# Patient Record
Sex: Female | Born: 1945
Health system: Southern US, Community
[De-identification: ages and names within clinical notes are randomized; demographics above are authoritative.]

## PROBLEM LIST (undated history)

## (undated) DIAGNOSIS — M1990 Unspecified osteoarthritis, unspecified site: Secondary | ICD-10-CM

## (undated) DIAGNOSIS — Z8719 Personal history of other diseases of the digestive system: Secondary | ICD-10-CM

## (undated) DIAGNOSIS — T7840XA Allergy, unspecified, initial encounter: Secondary | ICD-10-CM

## (undated) DIAGNOSIS — F329 Major depressive disorder, single episode, unspecified: Secondary | ICD-10-CM

## (undated) DIAGNOSIS — Z87442 Personal history of urinary calculi: Secondary | ICD-10-CM

## (undated) DIAGNOSIS — B019 Varicella without complication: Secondary | ICD-10-CM

## (undated) DIAGNOSIS — N390 Urinary tract infection, site not specified: Secondary | ICD-10-CM

## (undated) DIAGNOSIS — K635 Polyp of colon: Secondary | ICD-10-CM

## (undated) DIAGNOSIS — I809 Phlebitis and thrombophlebitis of unspecified site: Secondary | ICD-10-CM

## (undated) DIAGNOSIS — E119 Type 2 diabetes mellitus without complications: Secondary | ICD-10-CM

## (undated) DIAGNOSIS — F988 Other specified behavioral and emotional disorders with onset usually occurring in childhood and adolescence: Secondary | ICD-10-CM

## (undated) DIAGNOSIS — N200 Calculus of kidney: Secondary | ICD-10-CM

## (undated) DIAGNOSIS — F32A Depression, unspecified: Secondary | ICD-10-CM

## (undated) DIAGNOSIS — D649 Anemia, unspecified: Secondary | ICD-10-CM

## (undated) DIAGNOSIS — K219 Gastro-esophageal reflux disease without esophagitis: Secondary | ICD-10-CM

## (undated) HISTORY — DX: Phlebitis and thrombophlebitis of unspecified site: I80.9

## (undated) HISTORY — PX: WISDOM TOOTH EXTRACTION: SHX21

## (undated) HISTORY — PX: URETEROCELE INCISION: SHX2612

## (undated) HISTORY — DX: Major depressive disorder, single episode, unspecified: F32.9

## (undated) HISTORY — PX: TUBAL LIGATION: SHX77

## (undated) HISTORY — PX: VASCULAR SURGERY: SHX849

## (undated) HISTORY — DX: Calculus of kidney: N20.0

## (undated) HISTORY — DX: Depression, unspecified: F32.A

## (undated) HISTORY — DX: Varicella without complication: B01.9

## (undated) HISTORY — DX: Allergy, unspecified, initial encounter: T78.40XA

## (undated) HISTORY — DX: Other specified behavioral and emotional disorders with onset usually occurring in childhood and adolescence: F98.8

## (undated) HISTORY — DX: Unspecified osteoarthritis, unspecified site: M19.90

## (undated) HISTORY — DX: Urinary tract infection, site not specified: N39.0

## (undated) HISTORY — DX: Type 2 diabetes mellitus without complications: E11.9

## (undated) HISTORY — DX: Polyp of colon: K63.5

## (undated) HISTORY — DX: Anemia, unspecified: D64.9

## (undated) HISTORY — DX: Gastro-esophageal reflux disease without esophagitis: K21.9

---

## 1997-12-19 ENCOUNTER — Ambulatory Visit (HOSPITAL_COMMUNITY): Admission: RE | Admit: 1997-12-19 | Discharge: 1997-12-19 | Payer: Self-pay | Admitting: Family Medicine

## 1997-12-19 ENCOUNTER — Encounter: Payer: Self-pay | Admitting: Family Medicine

## 1998-01-03 ENCOUNTER — Encounter: Payer: Self-pay | Admitting: Family Medicine

## 1998-01-03 ENCOUNTER — Ambulatory Visit (HOSPITAL_COMMUNITY): Admission: RE | Admit: 1998-01-03 | Discharge: 1998-01-03 | Payer: Self-pay | Admitting: Family Medicine

## 1998-09-15 ENCOUNTER — Other Ambulatory Visit: Admission: RE | Admit: 1998-09-15 | Discharge: 1998-09-15 | Payer: Self-pay | Admitting: Family Medicine

## 1999-01-09 ENCOUNTER — Encounter: Payer: Self-pay | Admitting: Family Medicine

## 1999-01-09 ENCOUNTER — Ambulatory Visit (HOSPITAL_COMMUNITY): Admission: RE | Admit: 1999-01-09 | Discharge: 1999-01-09 | Payer: Self-pay | Admitting: Family Medicine

## 1999-01-19 ENCOUNTER — Encounter: Payer: Self-pay | Admitting: Family Medicine

## 1999-01-19 ENCOUNTER — Ambulatory Visit (HOSPITAL_COMMUNITY): Admission: RE | Admit: 1999-01-19 | Discharge: 1999-01-19 | Payer: Self-pay | Admitting: Family Medicine

## 1999-10-09 ENCOUNTER — Other Ambulatory Visit: Admission: RE | Admit: 1999-10-09 | Discharge: 1999-10-09 | Payer: Self-pay | Admitting: *Deleted

## 2001-05-04 ENCOUNTER — Ambulatory Visit (HOSPITAL_COMMUNITY): Admission: RE | Admit: 2001-05-04 | Discharge: 2001-05-04 | Payer: Self-pay | Admitting: Family Medicine

## 2001-05-04 ENCOUNTER — Encounter: Payer: Self-pay | Admitting: Family Medicine

## 2006-04-06 ENCOUNTER — Ambulatory Visit (HOSPITAL_COMMUNITY): Admission: RE | Admit: 2006-04-06 | Discharge: 2006-04-06 | Payer: Self-pay | Admitting: Obstetrics and Gynecology

## 2006-04-19 ENCOUNTER — Other Ambulatory Visit: Admission: RE | Admit: 2006-04-19 | Discharge: 2006-04-19 | Payer: Self-pay | Admitting: Obstetrics and Gynecology

## 2007-07-19 ENCOUNTER — Ambulatory Visit: Payer: Self-pay | Admitting: Internal Medicine

## 2007-07-19 DIAGNOSIS — E782 Mixed hyperlipidemia: Secondary | ICD-10-CM | POA: Insufficient documentation

## 2007-07-19 DIAGNOSIS — E785 Hyperlipidemia, unspecified: Secondary | ICD-10-CM

## 2007-07-19 DIAGNOSIS — F3289 Other specified depressive episodes: Secondary | ICD-10-CM | POA: Insufficient documentation

## 2007-07-19 DIAGNOSIS — F988 Other specified behavioral and emotional disorders with onset usually occurring in childhood and adolescence: Secondary | ICD-10-CM | POA: Insufficient documentation

## 2007-07-19 DIAGNOSIS — I8390 Asymptomatic varicose veins of unspecified lower extremity: Secondary | ICD-10-CM | POA: Insufficient documentation

## 2007-07-19 DIAGNOSIS — E1165 Type 2 diabetes mellitus with hyperglycemia: Secondary | ICD-10-CM | POA: Insufficient documentation

## 2007-07-19 DIAGNOSIS — F329 Major depressive disorder, single episode, unspecified: Secondary | ICD-10-CM | POA: Insufficient documentation

## 2007-07-19 DIAGNOSIS — E119 Type 2 diabetes mellitus without complications: Secondary | ICD-10-CM | POA: Insufficient documentation

## 2007-10-03 ENCOUNTER — Encounter: Payer: Self-pay | Admitting: Family Medicine

## 2007-10-03 ENCOUNTER — Ambulatory Visit: Payer: Self-pay | Admitting: Internal Medicine

## 2007-10-03 LAB — CONVERTED CEMR LAB: Hgb A1c MFr Bld: 6.5 % — ABNORMAL HIGH (ref 4.6–6.0)

## 2007-12-27 ENCOUNTER — Ambulatory Visit: Payer: Self-pay | Admitting: Internal Medicine

## 2007-12-27 LAB — CONVERTED CEMR LAB: Blood Glucose, Fingerstick: 115

## 2008-01-23 ENCOUNTER — Ambulatory Visit: Payer: Self-pay | Admitting: Internal Medicine

## 2008-01-23 DIAGNOSIS — B308 Other viral conjunctivitis: Secondary | ICD-10-CM | POA: Insufficient documentation

## 2008-02-27 DIAGNOSIS — J069 Acute upper respiratory infection, unspecified: Secondary | ICD-10-CM | POA: Insufficient documentation

## 2008-02-28 ENCOUNTER — Ambulatory Visit: Payer: Self-pay | Admitting: Family Medicine

## 2008-03-26 ENCOUNTER — Ambulatory Visit: Payer: Self-pay | Admitting: Internal Medicine

## 2008-11-01 ENCOUNTER — Ambulatory Visit: Payer: Self-pay | Admitting: Internal Medicine

## 2008-11-07 ENCOUNTER — Telehealth: Payer: Self-pay | Admitting: Internal Medicine

## 2009-02-26 ENCOUNTER — Ambulatory Visit: Payer: Self-pay | Admitting: Internal Medicine

## 2009-03-07 ENCOUNTER — Ambulatory Visit: Payer: Self-pay | Admitting: Internal Medicine

## 2009-03-07 LAB — CONVERTED CEMR LAB
AST: 18 units/L (ref 0–37)
Alkaline Phosphatase: 67 units/L (ref 39–117)
Basophils Absolute: 0 10*3/uL (ref 0.0–0.1)
Basophils Relative: 0.7 % (ref 0.0–3.0)
Bilirubin Urine: NEGATIVE
Bilirubin, Direct: 0 mg/dL (ref 0.0–0.3)
CO2: 28 meq/L (ref 19–32)
Calcium: 9.1 mg/dL (ref 8.4–10.5)
Cholesterol: 268 mg/dL — ABNORMAL HIGH (ref 0–200)
Creatinine, Ser: 0.7 mg/dL (ref 0.4–1.2)
Creatinine,U: 145.4 mg/dL
Direct LDL: 222 mg/dL
Eosinophils Absolute: 0.2 10*3/uL (ref 0.0–0.7)
GFR calc non Af Amer: 89.84 mL/min (ref >60)
Glucose, Urine, Semiquant: NEGATIVE
HDL: 44.4 mg/dL (ref >39.00)
Hemoglobin: 11.9 g/dL — ABNORMAL LOW (ref 12.0–15.0)
Ketones, urine, test strip: NEGATIVE
Lymphocytes Relative: 33 % (ref 12.0–46.0)
MCHC: 32.1 g/dL (ref 30.0–36.0)
Microalb Creat Ratio: 6.2 mg/g (ref 0.0–30.0)
Microalb, Ur: 0.9 mg/dL (ref 0.0–1.9)
Monocytes Relative: 8 % (ref 3.0–12.0)
Neutro Abs: 2.6 10*3/uL (ref 1.4–7.7)
Neutrophils Relative %: 53.5 % (ref 43.0–77.0)
Nitrite: NEGATIVE
Protein, U semiquant: NEGATIVE
RBC: 4.54 M/uL (ref 3.87–5.11)
RDW: 13.3 % (ref 11.5–14.6)
Sodium: 140 meq/L (ref 135–145)
Total CHOL/HDL Ratio: 6
Triglycerides: 107 mg/dL (ref 0.0–149.0)
Urobilinogen, UA: 0.2

## 2009-03-21 ENCOUNTER — Ambulatory Visit: Payer: Self-pay | Admitting: Internal Medicine

## 2009-03-21 DIAGNOSIS — M1710 Unilateral primary osteoarthritis, unspecified knee: Secondary | ICD-10-CM

## 2009-03-21 DIAGNOSIS — M179 Osteoarthritis of knee, unspecified: Secondary | ICD-10-CM | POA: Insufficient documentation

## 2009-06-13 ENCOUNTER — Encounter (INDEPENDENT_AMBULATORY_CARE_PROVIDER_SITE_OTHER): Payer: Self-pay | Admitting: *Deleted

## 2009-07-02 ENCOUNTER — Encounter: Payer: Self-pay | Admitting: Internal Medicine

## 2010-03-19 NOTE — Letter (Signed)
Summary: Eye Exam/[facility]  Eye Exam/[facility]   Imported By: Maryln Gottron 07/09/2009 11:23:49  _____________________________________________________________________  External Attachment:    Type:   Image     Comment:   External Document

## 2010-03-19 NOTE — Assessment & Plan Note (Signed)
Summary: cpx/mm   Vital Signs:  Patient profile:   65 year old female Height:      63.5 inches Weight:      177 pounds BMI:     30.97 Temp:     98.6 degrees F oral BP sitting:   132 / 50  (left arm) Cuff size:   regular  Vitals Entered By: Raechel Ache, RN (March 21, 2009 9:02 AM) CC: cpx , labs done , wants ref. to gyn , c/o arthritsis knees , IBs ,  pounding heart at noc , BS 90-110    CC:  cpx , labs done , wants ref. to gyn , c/o arthritsis knees , IBs , pounding heart at noc , and BS 90-110 .  History of Present Illness: 65 year old patient seen today for a health maintenance exam.  Medical problems include diabetes, and dyslipidemia.  She has Lipitor in the past, but this was self discontinued due to concerns about memory impairment.  She has a history also of ADHD.  History depression, which has been stable.  In no A1c is have been well-controlled.  Complaints today include periodic IBS flares, as well as arthritis involving both knees.  Allergies: 1)  ! Penicillin V Potassium (Penicillin V Potassium) 2)  ! Macrodantin (Nitrofurantoin Macrocrystal)  Past History:  Past Medical History: Depression Diabetes mellitus, type II Hyperlipidemia Varicose veins of legs ADD Osteoarthritis (Knee pain) IBS RBBB  Past Surgical History: gravid two, para two, abortus zero, status post C-section x 2 GU surgery- undefined bladder surgery while in college colonoscopy - none Of a PA and  Family History: Reviewed history from 07/19/2007 and no changes required. Family History Other cance rmom had kidney cancer Family History Hypertension  father history of diabetes at age 70 mother died age 45 of renal cancer  1 B DJD, obesity 1 S Obesity and DM  Social History: Reviewed history from 07/19/2007 and no changes required. Occupation:teacher Married Never Smoked 2 daughters  Review of Systems  The patient denies anorexia, fever, weight loss, weight gain, vision  loss, decreased hearing, hoarseness, chest pain, syncope, dyspnea on exertion, peripheral edema, prolonged cough, headaches, hemoptysis, abdominal pain, melena, hematochezia, severe indigestion/heartburn, hematuria, incontinence, genital sores, muscle weakness, suspicious skin lesions, transient blindness, difficulty walking, depression, unusual weight change, abnormal bleeding, enlarged lymph nodes, angioedema, and breast masses.    Physical Exam  General:  overweight-appearing.  130/70overweight-appearing.   Head:  Normocephalic and atraumatic without obvious abnormalities. No apparent alopecia or balding. Eyes:  No corneal or conjunctival inflammation noted. EOMI. Perrla. Funduscopic exam benign, without hemorrhages, exudates or papilledema. Vision grossly normal. Ears:  External ear exam shows no significant lesions or deformities.  Otoscopic examination reveals clear canals, tympanic membranes are intact bilaterally without bulging, retraction, inflammation or discharge. Hearing is grossly normal bilaterally. Nose:  External nasal examination shows no deformity or inflammation. Nasal mucosa are pink and moist without lesions or exudates. Mouth:  Oral mucosa and oropharynx without lesions or exudates.  Teeth in good repair. Neck:  No deformities, masses, or tenderness noted. decrease  right carotid upstroke  no bruit Chest Wall:  No deformities, masses, or tenderness noted. Breasts:  No mass, nodules, thickening, tenderness, bulging, retraction, inflamation, nipple discharge or skin changes noted.   Lungs:  Normal respiratory effort, chest expands symmetrically. Lungs are clear to auscultation, no crackles or wheezes. Heart:  Normal rate and regular rhythm. S1 and S2 normal without gallop, murmur, click, rub or other extra sounds. Abdomen:  Bowel sounds positive,abdomen soft and non-tender without masses, organomegaly or hernias noted. Msk:  No deformity or scoliosis noted of thoracic or lumbar  spine.   Pulses:  R and L carotid,radial,femoral,dorsalis pedis and posterior tibial pulses are full and equal bilaterally Extremities:  No clubbing, cyanosis, edema, or deformity noted with normal full range of motion of all joints.   Neurologic:  No cranial nerve deficits noted. Station and gait are normal. Plantar reflexes are down-going bilaterally. DTRs are symmetrical throughout. Sensory, motor and coordinative functions appear intact. Skin:  Intact without suspicious lesions or rashes Cervical Nodes:  No lymphadenopathy noted Axillary Nodes:  No palpable lymphadenopathy Inguinal Nodes:  No significant adenopathy Psych:  Cognition and judgment appear intact. Alert and cooperative with normal attention span and concentration. No apparent delusions, illusions, hallucinations  Diabetes Management Exam:    Foot Exam (with socks and/or shoes not present):       Sensory-Pinprick/Light touch:          Left medial foot (L-4): normal          Left dorsal foot (L-5): normal          Left lateral foot (S-1): normal          Right medial foot (L-4): normal          Right dorsal foot (L-5): normal          Right lateral foot (S-1): normal       Sensory-Monofilament:          Left foot: normal          Right foot: normal       Inspection:          Left foot: normal          Right foot: normal       Nails:          Left foot: normal          Right foot: normal    Foot Exam by Podiatrist:       Date: 03/21/2009       Results: no diabetic findings       Done by: PCP   Impression & Recommendations:  Problem # 1:  Preventive Health Care (ICD-V70.0)  Orders: Gastroenterology Referral (GI) Gynecologic Referral (Gyn)  Complete Medication List: 1)  Adderall 30 Mg Tabs (Amphetamine-dextroamphetamine) .... 2 once daily 2)  Prozac 20 Mg Caps (Fluoxetine hcl) .... 2 once daily 3)  Deplin 7.5 Mg Tabs (L-methylfolate) .Marland Kitchen.. 1 once daily 4)  Triple Antibiotic Plus 1 % Oint  (Neomy-bacit-polymyx-pramoxine) .... Use 4 times daily 5)  Freestyle Lite Test Strp (Glucose blood) .... Use daily 6)  Bd Ultra-fine Lancets Misc (Lancets) .... Use daily 7)  Actoplus Met Xr 15-1000 Mg Xr24h-tab (Pioglitazone hcl-metformin hcl) .... One daily 8)  Flonase 50 Mcg/act Susp (Fluticasone propionate) .... Prn 9)  Hyoscyamine Sulfate 0.125 Mg Subl (Hyoscyamine sulfate) .... One sublingually every 6 hours as needed for abdominal pain or diarrhea 10)  Simvastatin 40 Mg Tabs (Simvastatin) .... One daily  Other Orders: EKG w/ Interpretation (93000)  Patient Instructions: 1)  Please schedule a follow-up appointment in 3 months. 2)  It is important that you exercise regularly at least 20 minutes 5 times a week. If you develop chest pain, have severe difficulty breathing, or feel very tired , stop exercising immediately and seek medical attention. 3)  You need to lose weight. Consider a lower calorie diet and regular exercise.  4)  Schedule your mammogram. 5)  Schedule a colonoscopy/sigmoidoscopy to help detect colon cancer. 6)  Take calcium +Vitamin D daily. 7)  Check your blood sugars regularly. If your readings are usually above : or below 70 you should contact our office. 8)  It is important that your Diabetic A1c level is checked every 3 months. 9)  See your eye doctor yearly to check for diabetic eye damage. Prescriptions: SIMVASTATIN 40 MG TABS (SIMVASTATIN) one daily  #90 x 1   Entered and Authorized by:   Gordy Savers  MD   Signed by:   Gordy Savers  MD on 03/21/2009   Method used:   Print then Give to Patient   RxID:   [id number] HYOSCYAMINE SULFATE 0.125 MG SUBL (HYOSCYAMINE SULFATE) one sublingually every 6 hours as needed for abdominal pain or diarrhea  #50 x 6   Entered and Authorized by:   Gordy Savers  MD   Signed by:   Gordy Savers  MD on 03/21/2009   Method used:   Print then Give to Patient   RxID:   [id number] FLONASE 50  MCG/ACT SUSP (FLUTICASONE PROPIONATE) PRN  #3 x 6   Entered and Authorized by:   Gordy Savers  MD   Signed by:   Gordy Savers  MD on 03/21/2009   Method used:   Print then Give to Patient   RxID:   [id number] ACTOPLUS MET XR 15-1000 MG XR24H-TAB (PIOGLITAZONE HCL-METFORMIN HCL) one daily  #90 x 6   Entered and Authorized by:   Gordy Savers  MD   Signed by:   Gordy Savers  MD on 03/21/2009   Method used:   Print then Give to Patient   RxID:   [id number] BD ULTRA-FINE LANCETS  MISC (LANCETS) use daily  #100 x 6   Entered and Authorized by:   Gordy Savers  MD   Signed by:   Gordy Savers  MD on 03/21/2009   Method used:   Print then Give to Patient   RxID:   [id number] FREESTYLE LITE TEST  STRP (GLUCOSE BLOOD) use daily  #100 x 6   Entered and Authorized by:   Gordy Savers  MD   Signed by:   Gordy Savers  MD on 03/21/2009   Method used:   Print then Give to Patient   RxID:   [id number] PROZAC 20 MG  CAPS (FLUOXETINE HCL) 2 once daily  #90 x 5   Entered and Authorized by:   Gordy Savers  MD   Signed by:   Gordy Savers  MD on 03/21/2009   Method used:   Print then Give to Patient   RxID:   [id number] ADDERALL 30 MG  TABS (AMPHETAMINE-DEXTROAMPHETAMINE) 2 once daily  #60 x 0   Entered and Authorized by:   Gordy Savers  MD   Signed by:   Gordy Savers  MD on 03/21/2009   Method used:   Print then Give to Patient   RxID:   [id number]

## 2010-03-19 NOTE — Letter (Signed)
Summary: Referral - not able to see patient  [facility]  [address] [place], [place] [address]   Phone: [phone]  Fax: [phone]    June 13, 2009   Gordy Savers, M.D. [address] [place], [place] [address]    Re:   CLEONE HULICK DOB:  December 27, 1945 MRN:   [id number]    Dear Dr. Amador Cunas:  Thank you for your kind referral of the above patient.  We have attempted to schedule the recommended procedure Screening Colonoscopy but have not been able to schedule because:   X  The patient was not available by phone and/or has not returned our calls.  ___ The patient declined to schedule the procedure at this time.  We appreciate the referral and hope that we will have the opportunity to treat this patient in the future.    Sincerely,    [facility] [phone]

## 2011-04-30 ENCOUNTER — Ambulatory Visit (INDEPENDENT_AMBULATORY_CARE_PROVIDER_SITE_OTHER): Payer: BC Managed Care – PPO | Admitting: Family Medicine

## 2011-04-30 VITALS — BP 127/69 | HR 105 | Temp 98.5°F | Resp 16 | Ht 63.5 in | Wt 182.0 lb

## 2011-04-30 DIAGNOSIS — Z0289 Encounter for other administrative examinations: Secondary | ICD-10-CM

## 2011-04-30 DIAGNOSIS — Z0000 Encounter for general adult medical examination without abnormal findings: Secondary | ICD-10-CM

## 2011-04-30 NOTE — Progress Notes (Signed)
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 She's having no problems whatsoever.

## 2012-03-06 ENCOUNTER — Ambulatory Visit (INDEPENDENT_AMBULATORY_CARE_PROVIDER_SITE_OTHER): Payer: Medicare PPO | Admitting: Family Medicine

## 2012-03-06 ENCOUNTER — Ambulatory Visit: Payer: Medicare PPO

## 2012-03-06 VITALS — BP 142/78 | HR 86 | Temp 98.1°F | Resp 16 | Ht 64.5 in | Wt 183.6 lb

## 2012-03-06 DIAGNOSIS — M549 Dorsalgia, unspecified: Secondary | ICD-10-CM

## 2012-03-06 DIAGNOSIS — N200 Calculus of kidney: Secondary | ICD-10-CM

## 2012-03-06 LAB — POCT URINALYSIS DIPSTICK
Bilirubin, UA: NEGATIVE
Glucose, UA: NEGATIVE
Leukocytes, UA: NEGATIVE
Nitrite, UA: NEGATIVE
Protein, UA: 30
Spec Grav, UA: 1.025
Urobilinogen, UA: 0.2
pH, UA: 6.5

## 2012-03-06 LAB — POCT UA - MICROSCOPIC ONLY
Casts, Ur, LPF, POC: NEGATIVE
Crystals, Ur, HPF, POC: NEGATIVE
Mucus, UA: NEGATIVE
Yeast, UA: NEGATIVE

## 2012-03-06 MED ORDER — KETOROLAC TROMETHAMINE 30 MG/ML IJ SOLN
30.0000 mg | Freq: Once | INTRAMUSCULAR | Status: AC
  Administered 2012-03-06: 30 mg via INTRAMUSCULAR

## 2012-03-06 NOTE — Progress Notes (Signed)
[facility]:  Office Visit  Chief Complaint:  Chief Complaint  Patient presents with  . Back Pain    been going on for one week started with a twinge and became worse last night    HPI: Renee Young is a 67 y.o. female who complains of  1 week ago, she thinks she may have twisted her back. Intermittent Sharp pain on Left flank. She has NKI but has a h/o UTI and renal stones.Pain similar to kidney stone .  Comes and go with breathing. Does not hurt to urinate. Has not tried anything for this.    Past Medical History  Diagnosis Date  . Allergy   . Anemia   . Arthritis   . Depression   . Diabetes mellitus without complication   . UTI (urinary tract infection)   . Kidney stones   . ADD (attention deficit disorder)    Past Surgical History  Procedure Date  . Cesarean section   . Tubal ligation   . Ureterocele incision    History   Social History  . Marital Status: Married    Spouse Name: N/A    Number of Children: N/A  . Years of Education: N/A   Social History Main Topics  . Smoking status: Never Smoker   . Smokeless tobacco: None  . Alcohol Use: 3.6 oz/week    6 Glasses of wine per week  . Drug Use: No  . Sexually Active: Yes   Other Topics Concern  . None   Social History Narrative  . None   Family History  Problem Relation Age of Onset  . Cancer Mother   . Kidney disease Mother   . Diabetes Father   . Arthritis Sister   . Obesity Brother   . Heart disease Maternal Grandmother   . Heart disease Maternal Grandfather   . Stroke Paternal Grandmother   . Heart disease Paternal Grandmother   . Heart disease Paternal Grandfather    Allergies  Allergen Reactions  . Nitrofurantoin   . Penicillins    Prior to Admission medications   Medication Sig Start Date End Date Taking? Authorizing Provider  amphetamine-dextroamphetamine (ADDERALL) 30 MG tablet Take 30 mg by mouth 2 (two) times daily.   Yes Historical Provider, MD  FLUoxetine  (PROZAC) 20 MG capsule Take 20 mg by mouth 3 (three) times daily.   Yes Historical Provider, MD     ROS: The patient denies fevers, chills, night sweats, unintentional weight loss, chest pain, palpitations, wheezing, dyspnea on exertion, nausea, vomiting, abdominal pain, hematuria, melena, numbness, weakness, or tingling.   All other systems have been reviewed and were otherwise negative with the exception of those mentioned in the HPI and as above.    PHYSICAL EXAM: Filed Vitals:   03/06/12 1110  BP: 142/78  Pulse: 86  Temp: 98.1 F (36.7 C)  Resp: 16   Filed Vitals:   03/06/12 1110  Height: 5' 4.5" (1.638 m)  Weight: 183 lb 9.6 oz (83.28 kg)   Body mass index is 31.03 kg/(m^2).  General: Alert, no acute distress HEENT:  Normocephalic, atraumatic, oropharynx patent.  Cardiovascular:  Regular rate and rhythm, no rubs murmurs or gallops.  No Carotid bruits, radial pulse intact. No pedal edema.  Respiratory: Clear to auscultation bilaterally.  No wheezes, rales, or rhonchi.  No cyanosis, no use of accessory musculature GI: No organomegaly, abdomen is soft and non-tender, positive bowel sounds.  No masses. Skin: No rashes. Neurologic: Facial musculature symmetric.  Psychiatric: Patient is appropriate throughout our interaction. Lymphatic: No cervical lymphadenopathy Musculoskeletal: Gait intact. Full ROM with bending, 5/5 strength, sensation intact, no saddle anesthesia + left sided CVA   LABS: Results for orders placed in visit on 03/06/12  POCT UA - MICROSCOPIC ONLY      Component Value Range   WBC, Ur, HPF, POC 1-2     RBC, urine, microscopic 6-9     Bacteria, U Microscopic 1+     Mucus, UA neg     Epithelial cells, urine per micros 0-3     Crystals, Ur, HPF, POC neg     Casts, Ur, LPF, POC neg     Yeast, UA neg    POCT URINALYSIS DIPSTICK      Component Value Range   Color, UA yellow     Clarity, UA clear     Glucose, UA neg     Bilirubin, UA neg     Ketones,  UA trace     Spec Grav, UA 1.025     Blood, UA large     pH, UA 6.5     Protein, UA 30     Urobilinogen, UA 0.2     Nitrite, UA neg     Leukocytes, UA Negative       EKG/XRAY:   Primary read interpreted by Dr. Conley Rolls at [facility]. No obvious kidney stones No obstructive gas patterns + scoliosis and DJD No fractures/dislocations  ASSESSMENT/PLAN: Encounter Diagnoses  Name Primary?  . Back pain Yes  . Renal stone    Ibuprofen 600 mg PO daily with food Push fluids, Strainer given IM toradol 30 mg x 1 in office F/u prn    LE, THAO PHUONG, DO 03/06/2012 1:08 PM

## 2012-04-05 ENCOUNTER — Ambulatory Visit (INDEPENDENT_AMBULATORY_CARE_PROVIDER_SITE_OTHER): Payer: Medicare PPO | Admitting: Emergency Medicine

## 2012-04-05 VITALS — BP 128/82 | HR 122 | Temp 99.3°F | Resp 17 | Ht 63.5 in | Wt 183.0 lb

## 2012-04-05 DIAGNOSIS — Z23 Encounter for immunization: Secondary | ICD-10-CM

## 2012-04-05 DIAGNOSIS — J018 Other acute sinusitis: Secondary | ICD-10-CM

## 2012-04-05 MED ORDER — MELOXICAM 15 MG PO TABS: 15.0000 mg | ORAL_TABLET | Freq: Every day | ORAL | Status: DC

## 2012-04-05 MED ORDER — CEFPROZIL 500 MG PO TABS: 500.0000 mg | ORAL_TABLET | Freq: Two times a day (BID) | ORAL | Status: AC

## 2012-04-05 NOTE — Addendum Note (Signed)
Addended by: Celene Squibb on: 04/05/2012 04:14 PM   Modules accepted: Orders

## 2012-04-05 NOTE — Patient Instructions (Addendum)
ostOsteoarthritis Osteoarthritis is the most common form of arthritis. It is redness, soreness, and swelling (inflammation) affecting the cartilage. Cartilage acts as a cushion, covering the ends of bones where they meet to form a joint. CAUSES  Over time, the cartilage begins to wear away. This causes bone to rub on bone. This produces pain and stiffness in the affected joints. Factors that contribute to this problem are:  Excessive body weight.  Age.  Overuse of joints. SYMPTOMS   People with osteoarthritis usually experience joint pain, swelling, or stiffness.  Over time, the joint may lose its normal shape.  Small deposits of bone (osteophytes) may grow on the edges of the joint.  Bits of bone or cartilage can break off and float inside the joint space. This may cause more pain and damage.  Osteoarthritis can lead to depression, anxiety, feelings of helplessness, and limitations on daily activities. The most commonly affected joints are in the:  Ends of the fingers.  Thumbs.  Neck.  Lower back.  Knees.  Hips. DIAGNOSIS  Diagnosis is mostly based on your symptoms and exam. Tests may be helpful, including:  X-rays of the affected joint.  A computerized magnetic scan (MRI).  Blood tests to rule out other types of arthritis.  Joint fluid tests. This involves using a needle to draw fluid from the joint and examining the fluid under a microscope. TREATMENT  Goals of treatment are to control pain, improve joint function, maintain a normal body weight, and maintain a healthy lifestyle. Treatment approaches may include:  A prescribed exercise program with rest and joint relief.  Weight control with nutritional education.  Pain relief techniques such as:  Properly applied heat and cold.  Electric pulses delivered to nerve endings under the skin (transcutaneous electrical nerve stimulation, TENS).  Massage.  Certain supplements. Ask your caregiver before using any  supplements, especially in combination with prescribed drugs.  Medicines to control pain, such as:  Acetaminophen.  Nonsteroidal anti-inflammatory drugs (NSAIDs), such as naproxen.  Narcotic or central-acting agents, such as tramadol. This drug carries a risk of addiction and is generally prescribed for short-term use.  Corticosteroids. These can be given orally or as injection. This is a short-term treatment, not recommended for routine use.  Surgery to reposition the bones and relieve pain (osteotomy) or to remove loose pieces of bone and cartilage. Joint replacement may be needed in advanced states of osteoarthritis. HOME CARE INSTRUCTIONS  Your caregiver can recommend specific types of exercise. These may include:  Strengthening exercises. These are done to strengthen the muscles that support joints affected by arthritis. They can be performed with weights or with exercise bands to add resistance.  Aerobic activities. These are exercises, such as brisk walking or low-impact aerobics, that get your heart pumping. They can help keep your lungs and circulatory system in shape.  Range-of-motion activities. These keep your joints limber.  Balance and agility exercises. These help you maintain daily living skills. Learning about your condition and being actively involved in your care will help improve the course of your osteoarthritis. SEEK MEDICAL CARE IF:   You feel hot or your skin turns red.  You develop a rash in addition to your joint pain.  You have an oral temperature above 102 F (38.9 C). FOR MORE INFORMATION  National Institute of Arthritis and Musculoskeletal and Skin Diseases: www.niams.http://www.myers.net/ [employer] on Aging: https://walker.com/ American College of Rheumatology: www.rheumatology.org Document Released: 02/01/2005 Document Revised: 04/26/2011 Document Reviewed: 05/15/2009 [facility] Patient Information 2013 [place], [place].  Sinusitis Sinusitis is redness,  soreness, and swelling (inflammation) of the paranasal sinuses. Paranasal sinuses are air pockets within the bones of your face (beneath the eyes, the middle of the forehead, or above the eyes). In healthy paranasal sinuses, mucus is able to drain out, and air is able to circulate through them by way of your nose. However, when your paranasal sinuses are inflamed, mucus and air can become trapped. This can allow bacteria and other germs to grow and cause infection. Sinusitis can develop quickly and last only a short time (acute) or continue over a long period (chronic). Sinusitis that lasts for more than 12 weeks is considered chronic.  CAUSES  Causes of sinusitis include:  Allergies.  Structural abnormalities, such as displacement of the cartilage that separates your nostrils (deviated septum), which can decrease the air flow through your nose and sinuses and affect sinus drainage.  Functional abnormalities, such as when the small hairs (cilia) that line your sinuses and help remove mucus do not work properly or are not present. SYMPTOMS  Symptoms of acute and chronic sinusitis are the same. The primary symptoms are pain and pressure around the affected sinuses. Other symptoms include:  Upper toothache.  Earache.  Headache.  Bad breath.  Decreased sense of smell and taste.  A cough, which worsens when you are lying flat.  Fatigue.  Fever.  Thick drainage from your nose, which often is green and may contain pus (purulent).  Swelling and warmth over the affected sinuses. DIAGNOSIS  Your caregiver will perform a physical exam. During the exam, your caregiver may:  Look in your nose for signs of abnormal growths in your nostrils (nasal polyps).  Tap over the affected sinus to check for signs of infection.  View the inside of your sinuses (endoscopy) with a special imaging device with a light attached (endoscope), which is inserted into your sinuses. If your caregiver suspects  that you have chronic sinusitis, one or more of the following tests may be recommended:  Allergy tests.  Nasal culture A sample of mucus is taken from your nose and sent to a lab and screened for bacteria.  Nasal cytology A sample of mucus is taken from your nose and examined by your caregiver to determine if your sinusitis is related to an allergy. TREATMENT  Most cases of acute sinusitis are related to a viral infection and will resolve on their own within 10 days. Sometimes medicines are prescribed to help relieve symptoms (pain medicine, decongestants, nasal steroid sprays, or saline sprays).  However, for sinusitis related to a bacterial infection, your caregiver will prescribe antibiotic medicines. These are medicines that will help kill the bacteria causing the infection.  Rarely, sinusitis is caused by a fungal infection. In theses cases, your caregiver will prescribe antifungal medicine. For some cases of chronic sinusitis, surgery is needed. Generally, these are cases in which sinusitis recurs more than 3 times per year, despite other treatments. HOME CARE INSTRUCTIONS   Drink plenty of water. Water helps thin the mucus so your sinuses can drain more easily.  Use a humidifier.  Inhale steam 3 to 4 times a day (for example, sit in the bathroom with the shower running).  Apply a warm, moist washcloth to your face 3 to 4 times a day, or as directed by your caregiver.  Use saline nasal sprays to help moisten and clean your sinuses.  Take over-the-counter or prescription medicines for pain, discomfort, or fever only as directed by your caregiver. SEEK IMMEDIATE  MEDICAL CARE IF:  You have increasing pain or severe headaches.  You have nausea, vomiting, or drowsiness.  You have swelling around your face.  You have vision problems.  You have a stiff neck.  You have difficulty breathing. MAKE SURE YOU:   Understand these instructions.  Will watch your condition.  Will get  help right away if you are not doing well or get worse. Document Released: 02/01/2005 Document Revised: 04/26/2011 Document Reviewed: 02/16/2011 [facility] Patient Information 2013 [place], [place].

## 2012-04-05 NOTE — Progress Notes (Signed)
[facility] [address] [place] [address] [phone]  Date:  04/05/2012   Name:  Renee Young   DOB:  08-06-1945   MRN:  [id number]  PCP:  Rogelia Boga, MD    Chief Complaint: Sinusitis, Immunizations and Knee Pain   History of Present Illness:  Renee Young is a 67 y.o. very pleasant female patient who presents with the following:  Ill past few days with sore throat and a purulent drainage from her nose and a post nasal drip.  Has a cough productive of a mucoid sputum.  No wheezing or shortness of breath.  No nausea of vomiting.  Pain in cheeks.  No improvement with OTC medication.  Has a concern that she has arthritis in her knees and pain in her left leg.  No numbness or weakness.   Patient Active Problem List  Diagnosis  . OTHER VIRAL CONJUNCTIVITIS  . DIABETES MELLITUS, TYPE II  . HYPERLIPIDEMIA  . DEPRESSION  . ATTENTION DEFICIT DISORDER  . ASYMPTOMATIC VARICOSE VEINS  . VIRAL URI  . OSTEOARTHRITIS    Past Medical History  Diagnosis Date  . Allergy   . Anemia   . Arthritis   . Depression   . Diabetes mellitus without complication   . UTI (urinary tract infection)   . Kidney stones   . ADD (attention deficit disorder)     Past Surgical History  Procedure Laterality Date  . Cesarean section    . Tubal ligation    . Ureterocele incision      History  Substance Use Topics  . Smoking status: Never Smoker   . Smokeless tobacco: Not on file  . Alcohol Use: 3.6 oz/week    6 Glasses of wine per week    Family History  Problem Relation Age of Onset  . Cancer Mother   . Kidney disease Mother   . Diabetes Father   . Arthritis Sister   . Obesity Brother   . Heart disease Maternal Grandmother   . Heart disease Maternal Grandfather   . Stroke Paternal Grandmother   . Heart disease Paternal Grandmother   . Heart disease Paternal Grandfather     Allergies  Allergen Reactions  . Nitrofurantoin   . Penicillins      Medication list has been reviewed and updated.  Current Outpatient Prescriptions on File Prior to Visit  Medication Sig Dispense Refill  . amphetamine-dextroamphetamine (ADDERALL) 30 MG tablet Take 30 mg by mouth 2 (two) times daily.      Marland Kitchen FLUoxetine (PROZAC) 20 MG capsule Take 20 mg by mouth 3 (three) times daily.       No current facility-administered medications on file prior to visit.    Review of Systems:   As per HPI, otherwise negative.    Physical Examination: Filed Vitals:   04/05/12 1500  BP: 128/82  Pulse: 122  Temp: 99.3 F (37.4 C)  Resp: 17   Filed Vitals:   04/05/12 1500  Height: 5' 3.5" (1.613 m)  Weight: 183 lb (83.008 kg)   Body mass index is 31.9 kg/(m^2). Ideal Body Weight: Weight in (lb) to have BMI = 25: 143.1  GEN: WDWN, NAD, Non-toxic, A & O x 3 HEENT: Atraumatic, Normocephalic. Neck supple. No masses, No LAD. Ears and Nose: No external deformity. CV: RRR, No M/G/R. No JVD. No thrill. No extra heart sounds. PULM: CTA B, no wheezes, crackles, rhonchi. No retractions. No resp. distress. No accessory muscle use. ABD: S, NT,  ND, +BS. No rebound. No HSM. EXTR: No c/c/e  Knees stable and no effusion NEURO Normal gait.  PSYCH: Normally interactive. Conversant. Not depressed or anxious appearing.  Calm demeanor.    Assessment and Plan: Sinusitis augmentin mucinex Osteoarthritis mobic  Carmelina Dane, MD

## 2012-04-27 ENCOUNTER — Ambulatory Visit (INDEPENDENT_AMBULATORY_CARE_PROVIDER_SITE_OTHER): Payer: Medicare PPO | Admitting: Family Medicine

## 2012-04-27 ENCOUNTER — Encounter: Payer: Self-pay | Admitting: Family Medicine

## 2012-04-27 VITALS — BP 138/82 | HR 93 | Temp 98.0°F | Resp 16 | Ht 64.5 in | Wt 186.0 lb

## 2012-04-27 DIAGNOSIS — M179 Osteoarthritis of knee, unspecified: Secondary | ICD-10-CM

## 2012-04-27 DIAGNOSIS — Z23 Encounter for immunization: Secondary | ICD-10-CM

## 2012-04-27 DIAGNOSIS — Z13228 Encounter for screening for other metabolic disorders: Secondary | ICD-10-CM

## 2012-04-27 DIAGNOSIS — M1710 Unilateral primary osteoarthritis, unspecified knee: Secondary | ICD-10-CM

## 2012-04-27 DIAGNOSIS — IMO0002 Reserved for concepts with insufficient information to code with codable children: Secondary | ICD-10-CM

## 2012-04-27 DIAGNOSIS — Z130 Encounter for screening for diseases of the blood and blood-forming organs and certain disorders involving the immune mechanism: Secondary | ICD-10-CM

## 2012-04-27 MED ORDER — PREDNISONE 20 MG PO TABS: 40.0000 mg | ORAL_TABLET | Freq: Every day | ORAL | Status: DC

## 2012-04-27 NOTE — Progress Notes (Signed)
Bilateral knee pain for more than 2 months.  Scoliosis noted on lumbar films last January. Went on treks in [place] last September which aggravated particularly the left knee.  Associated with some swelling and popliteal tenderness. F/H of arthritis (brother had knee replacements)  Objective:  NAD Marked synovial thickening bilaterally, worse on left Good ROM Marked varicosities on left medial lower extremity. Moderate Heberden's nodes on all fingers  Assessment:  Arthritic changes, need for immunization update  Plan:   Arthritis of knee, degenerative - Plan: predniSONE (DELTASONE) 20 MG tablet, Ambulatory referral to Physical Therapy  Need for prophylactic vaccination against Streptococcus pneumoniae (pneumococcus) - Plan: Pneumococcal polysaccharide vaccine 23-valent greater than or equal to 2yo subcutaneous/IM  Need for prophylactic vaccination with combined diphtheria-tetanus-pertussis (DTP) vaccine - Plan: Tdap vaccine greater than or equal to 7yo IM

## 2012-06-15 ENCOUNTER — Ambulatory Visit (INDEPENDENT_AMBULATORY_CARE_PROVIDER_SITE_OTHER): Payer: Medicare PPO | Admitting: Family Medicine

## 2012-06-15 ENCOUNTER — Ambulatory Visit (HOSPITAL_COMMUNITY)
Admission: RE | Admit: 2012-06-15 | Discharge: 2012-06-15 | Disposition: A | Discharge status: Home / Self Care | Payer: Medicare FFS | Source: Ambulatory Visit | Attending: Family Medicine | Admitting: Family Medicine

## 2012-06-15 VITALS — BP 146/76 | HR 96 | Temp 98.4°F | Resp 16 | Ht 64.0 in | Wt 187.0 lb

## 2012-06-15 DIAGNOSIS — M7989 Other specified soft tissue disorders: Secondary | ICD-10-CM | POA: Insufficient documentation

## 2012-06-15 DIAGNOSIS — I83893 Varicose veins of bilateral lower extremities with other complications: Secondary | ICD-10-CM

## 2012-06-15 DIAGNOSIS — M25562 Pain in left knee: Secondary | ICD-10-CM

## 2012-06-15 DIAGNOSIS — I83892 Varicose veins of left lower extremities with other complications: Secondary | ICD-10-CM

## 2012-06-15 DIAGNOSIS — M79609 Pain in unspecified limb: Secondary | ICD-10-CM | POA: Insufficient documentation

## 2012-06-15 DIAGNOSIS — M79605 Pain in left leg: Secondary | ICD-10-CM

## 2012-06-15 NOTE — Progress Notes (Signed)
[facility]:  Office Visit  Chief Complaint:  Chief Complaint  Patient presents with  . Varicose Veins    On left leg-has always had them but for 2 days her leg is swelling and is now painful and hot to touch    HPI: Renee Young is a 67 y.o. female who complains of  2 day history of left lower extremity pain described as  sharp to bruising, aching pain with swelling, redness. She has a h/o significant left leg varicose veins but has never had any problems with her varicose veins like this. Recent long car ride from [place], [place] -6 hour drive. She came back on Monday and the sxs started in her left leg on Tuesday. She has tried elevating without relief. No other risk factors for blood clots.  Denies any recent surgeries, cancer. Nonsmoker. Did fall on her knee this past week but not on leg. + SOB ? Related to Allergies  Vs warm weather. She has had no pior blood clots.   Past Medical History  Diagnosis Date  . Allergy   . Anemia   . Arthritis   . Depression   . Diabetes mellitus without complication   . UTI (urinary tract infection)   . Kidney stones   . ADD (attention deficit disorder)    Past Surgical History  Procedure Laterality Date  . Cesarean section    . Tubal ligation    . Ureterocele incision     History   Social History  . Marital Status: Married    Spouse Name: N/A    Number of Children: N/A  . Years of Education: N/A   Social History Main Topics  . Smoking status: Never Smoker   . Smokeless tobacco: None  . Alcohol Use: 1.2 oz/week    2 Glasses of wine per week  . Drug Use: No  . Sexually Active: Yes   Other Topics Concern  . None   Social History Narrative  . None   Family History  Problem Relation Age of Onset  . Cancer Mother   . Kidney disease Mother   . Diabetes Father   . Arthritis Sister   . Obesity Brother   . Arthritis Brother   . Heart disease Maternal Grandmother   . Stroke Maternal Grandmother   . Hypertension  Maternal Grandmother   . Heart disease Maternal Grandfather   . Hyperlipidemia Maternal Grandfather   . Stroke Paternal Grandmother   . Heart disease Paternal Grandmother   . Heart disease Paternal Grandfather   . Heart attack Paternal Grandfather   . Arthritis Daughter    Allergies  Allergen Reactions  . Nitrofurantoin   . Penicillins    Prior to Admission medications   Medication Sig Start Date End Date Taking? Authorizing Provider  amphetamine-dextroamphetamine (ADDERALL) 30 MG tablet Take 30 mg by mouth 2 (two) times daily.   Yes Historical Provider, MD  FLUoxetine (PROZAC) 20 MG capsule Take 20 mg by mouth 3 (three) times daily.   Yes Historical Provider, MD  meloxicam (MOBIC) 15 MG tablet Take 1 tablet (15 mg total) by mouth daily. 04/05/12  Yes Phillips Odor, MD     ROS: The patient denies fevers, chills, night sweats, unintentional weight loss, chest pain, palpitations, wheezing, dyspnea on exertion, nausea, vomiting, abdominal pain, dysuria, hematuria, melena, numbness, weakness, or tingling.   All other systems have been reviewed and were otherwise negative with the exception of those mentioned in the HPI and as above.  PHYSICAL EXAM: Filed Vitals:   06/15/12 1648  BP: 146/76  Pulse: 96  Temp: 98.4 F (36.9 C)  Resp: 16   Filed Vitals:   06/15/12 1648  Height: 5\' 4"  (1.626 m)  Weight: 187 lb (84.823 kg)   Body mass index is 32.08 kg/(m^2).  General: Alert, no acute distress HEENT:  Normocephalic, atraumatic, oropharynx patent.  Cardiovascular:  Regular rate and rhythm, no rubs murmurs or gallops.  No Carotid bruits, radial pulse intact. No pedal edema.  Respiratory: Clear to auscultation bilaterally.  No wheezes, rales, or rhonchi.  No cyanosis, no use of accessory musculature GI: No organomegaly, abdomen is soft and non-tender, positive bowel sounds.  No masses. Skin: No rashes. Neurologic: Facial musculature symmetric. Psychiatric: Patient is  appropriate throughout our interaction. Lymphatic: No cervical lymphadenopathy Musculoskeletal: Gait intact. + left leg significant large varicose veins behind knee and calf Minimal warmth, + redness, + tender on palpation + warmth + erythema   LABS: Results for orders placed in visit on 03/06/12  POCT UA - MICROSCOPIC ONLY      Result Value Range   WBC, Ur, HPF, POC 1-2     RBC, urine, microscopic 6-9     Bacteria, U Microscopic 1+     Mucus, UA neg     Epithelial cells, urine per micros 0-3     Crystals, Ur, HPF, POC neg     Casts, Ur, LPF, POC neg     Yeast, UA neg    POCT URINALYSIS DIPSTICK      Result Value Range   Color, UA yellow     Clarity, UA clear     Glucose, UA neg     Bilirubin, UA neg     Ketones, UA trace     Spec Grav, UA 1.025     Blood, UA large     pH, UA 6.5     Protein, UA 30     Urobilinogen, UA 0.2     Nitrite, UA neg     Leukocytes, UA Negative       EKG/XRAY:   Primary read interpreted by Dr. Conley Rolls at [facility].   ASSESSMENT/PLAN: Encounter Diagnoses  Name Primary?  . Left leg pain Yes  . Varicose veins of leg with swelling, left   . Leg swelling     Get stat doppler exam I think it is mostly likely superficial thrombo phlebitis but there is a small risk of deep venous blood clot in leg, the doppler will help determine this I have advised her that I will let her know what to do if thrombophlebitis vs DVT Will send to vein surgeon if doppler is negative for DVT for further treatment options F/u prn   LE, THAO PHUONG, DO 06/15/2012 5:31 PM   Doppler results-No DVT.  Advise to Rest, Warm Compress, NSAIDs, Elevate, Support hose Will await for official US results, if has significant thrombus of greater saphenous then may need to get her into vascular and see what they would recommend ie antocoag eventhough superficial or other more invasive treatment

## 2012-06-16 ENCOUNTER — Inpatient Hospital Stay (HOSPITAL_COMMUNITY): Admission: RE | Admit: 2012-06-16 | Payer: Medicare FFS | Source: Ambulatory Visit

## 2012-06-16 ENCOUNTER — Other Ambulatory Visit: Payer: Self-pay | Admitting: Family Medicine

## 2012-06-16 DIAGNOSIS — I83812 Varicose veins of left lower extremities with pain: Secondary | ICD-10-CM

## 2012-06-16 NOTE — Progress Notes (Signed)
VASCULAR LAB PRELIMINARY  PRELIMINARY  PRELIMINARY  PRELIMINARY  Left lower extremity venous duplex completed.    Preliminary report:  Left:  No evidence of DVT of Baker's cyst. There are multiple thrombosed varicosities throughout the left calf  Renee Young, RVS 06/16/2012, 3:18 PM

## 2012-06-19 ENCOUNTER — Telehealth: Payer: Self-pay | Admitting: Radiology

## 2012-06-19 ENCOUNTER — Telehealth: Payer: Self-pay

## 2012-06-19 NOTE — Telephone Encounter (Signed)
I have preliminary report of the venous doppler, when can I expect final report.  VASCULAR LAB  PRELIMINARY PRELIMINARY PRELIMINARY PRELIMINARY  Left lower extremity venous duplex completed.  Preliminary report: Left: No evidence of DVT of Baker's cyst. There are multiple thrombosed varicosities throughout the left calf  SLAUGHTER, [place], RVS  06/16/2012, 3:18 PM

## 2012-06-19 NOTE — Telephone Encounter (Signed)
She should continue PT . Called her to advise.

## 2012-06-19 NOTE — Telephone Encounter (Signed)
Pts referral to vascular and [facility] of [place] is pending. Pt would like to know in the mean time if she should continue her physical therapy or stop it until she gets an appt.   Please call pt.  [phone]  bf

## 2012-06-20 ENCOUNTER — Other Ambulatory Visit: Payer: Self-pay | Admitting: *Deleted

## 2012-06-20 DIAGNOSIS — M79609 Pain in unspecified limb: Secondary | ICD-10-CM

## 2012-06-20 DIAGNOSIS — I83893 Varicose veins of bilateral lower extremities with other complications: Secondary | ICD-10-CM

## 2012-06-24 ENCOUNTER — Ambulatory Visit (INDEPENDENT_AMBULATORY_CARE_PROVIDER_SITE_OTHER): Payer: Medicare PPO | Admitting: Family Medicine

## 2012-06-24 VITALS — BP 135/71 | HR 71 | Temp 97.9°F | Resp 18 | Wt 187.0 lb

## 2012-06-24 DIAGNOSIS — I809 Phlebitis and thrombophlebitis of unspecified site: Secondary | ICD-10-CM

## 2012-06-24 MED ORDER — RIVAROXABAN 20 MG PO TABS: 20.0000 mg | ORAL_TABLET | Freq: Every day | ORAL | Status: DC

## 2012-06-24 NOTE — Progress Notes (Signed)
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 She recently went to a different doctor and was diagnosed with thrombophlebitis, superficial. She had a venous Doppler which was negative for deep venous thrombosis. Nevertheless the erythema, tenderness, and warmth had increased and spread since she started the ibuprofen. She's had no chest pain.  On a different note, her knee pain is better after physical therapy was initiated.  Patient has long history of varicose veins. Her mother also suffered from varicose veins and lymphedema.  Objective: Patient has an approximately 8 cm area of erythema, warmth and tenderness on the medial left upper knee extending into the popliteal area. She is associated varicosities diffusely in both legs. He has no pedal edema. Pulses are good bilaterally.  Assessment: Chronic keratoses with recent onset of thrombophlebitis, now extending despite the ibuprofen  Plan: I recommend that she get on Cipro for for 2 weeks, continue the ibuprofen. If the child who was on portable, start aspirin twice a day.  In addition patient will be traveling to [place] this summer and I recommend that she take is arousable for a long distance flight and or high-dose aspirin to prevent thrombophlebitis.  Written for a prescription of Jobst stockings.  Signed, [name]

## 2012-06-24 NOTE — Patient Instructions (Signed)
Take the Xarelto once a day for two weeks.  If too expensive, take aspirin 325 mg (coated) twice a day for two weeks.  Remember to take anticoagulation method starting one day before long trips.    Also, consider the support stockings.

## 2012-06-27 ENCOUNTER — Telehealth: Payer: Self-pay | Admitting: Radiology

## 2012-06-27 ENCOUNTER — Telehealth: Payer: Self-pay

## 2012-06-27 ENCOUNTER — Other Ambulatory Visit: Payer: Self-pay | Admitting: Family Medicine

## 2012-06-27 MED ORDER — MELOXICAM 7.5 MG PO TABS: 7.5000 mg | ORAL_TABLET | Freq: Every day | ORAL | Status: DC

## 2012-06-27 NOTE — Telephone Encounter (Signed)
i ordered meloxicam

## 2012-06-27 NOTE — Telephone Encounter (Signed)
Please advise 

## 2012-06-27 NOTE — Telephone Encounter (Signed)
yes

## 2012-06-27 NOTE — Telephone Encounter (Signed)
I ordered meloxicam for her.

## 2012-06-27 NOTE — Telephone Encounter (Signed)
You ordered meloxicam, can she take with xarelto?

## 2012-06-27 NOTE — Telephone Encounter (Signed)
PT STATES SHE IS HAVING PAIN IN HER LEGS WHERE THE VEINS ARE AND IS ALSO ON BLOOD THINNER. PLEASE CALL [phone]. WOULD LIKE TO HAVE SOMETHING CALLED IN    [place]

## 2012-06-28 NOTE — Telephone Encounter (Signed)
Notified pt who stated she had been taking Mobic previously for another condition and will restart it.

## 2012-06-30 ENCOUNTER — Encounter: Payer: Self-pay | Admitting: Surgery

## 2012-07-03 ENCOUNTER — Ambulatory Visit (INDEPENDENT_AMBULATORY_CARE_PROVIDER_SITE_OTHER): Payer: Medicare FFS | Admitting: Vascular Surgery

## 2012-07-03 ENCOUNTER — Encounter (INDEPENDENT_AMBULATORY_CARE_PROVIDER_SITE_OTHER): Payer: Medicare FFS | Admitting: *Deleted

## 2012-07-03 ENCOUNTER — Encounter: Payer: Self-pay | Admitting: Vascular Surgery

## 2012-07-03 VITALS — BP 135/86 | HR 97 | Resp 16 | Ht 64.5 in | Wt 187.0 lb

## 2012-07-03 DIAGNOSIS — I83893 Varicose veins of bilateral lower extremities with other complications: Secondary | ICD-10-CM

## 2012-07-03 DIAGNOSIS — M79609 Pain in unspecified limb: Secondary | ICD-10-CM | POA: Insufficient documentation

## 2012-07-03 DIAGNOSIS — M7989 Other specified soft tissue disorders: Secondary | ICD-10-CM | POA: Insufficient documentation

## 2012-07-03 HISTORY — DX: Varicose veins of bilateral lower extremities with other complications: I83.893

## 2012-07-03 NOTE — Progress Notes (Signed)
Subjective:     Patient ID: Renee Young, female   DOB: February 02, 1946, 67 y.o.   MRN: [id number]  HPI this 67 year old female is referred for painful varicosities and recent episode of thrombophlebitis in the left leg. She has had varicose veins in her left leg for many years-29. He has developed after her last pregnancy. They have become increasingly uncomfortable with aching throbbing itching and chronic swelling in the left ankle and foot. 3 weeks ago she developed pain and "knots" in the bulges in the left calf and distal thigh area. She was found to have superficial thrombophlebitis with no DVT. She was started on Xeralto one week ago for a 2 week course. She has no previous episode of DVT, pulmonary embolus, stasis ulcers, bleeding, or other complications. Her discomfort in the left calf and thigh is improving.  Past Medical History  Diagnosis Date  . Allergy   . Anemia   . Arthritis   . Depression   . Diabetes mellitus without complication   . UTI (urinary tract infection)   . Kidney stones   . ADD (attention deficit disorder)     History  Substance Use Topics  . Smoking status: Never Smoker   . Smokeless tobacco: Never Used  . Alcohol Use: 1.2 oz/week    2 Glasses of wine per week    Family History  Problem Relation Age of Onset  . Cancer Mother   . Kidney disease Mother   . Diabetes Father   . Arthritis Sister   . Obesity Brother   . Arthritis Brother   . Heart disease Maternal Grandmother   . Stroke Maternal Grandmother   . Hypertension Maternal Grandmother   . Heart disease Maternal Grandfather   . Hyperlipidemia Maternal Grandfather   . Stroke Paternal Grandmother   . Heart disease Paternal Grandmother   . Heart disease Paternal Grandfather   . Heart attack Paternal Grandfather   . Arthritis Daughter     Allergies  Allergen Reactions  . Nitrofurantoin   . Penicillins     Current outpatient prescriptions:amphetamine-dextroamphetamine (ADDERALL) 30 MG tablet,  Take 30 mg by mouth 2 (two) times daily., Disp: , Rfl: ;  FLUoxetine (PROZAC) 20 MG capsule, Take 20 mg by mouth 3 (three) times daily., Disp: , Rfl: ;  meloxicam (MOBIC) 7.5 MG tablet, Take 1 tablet (7.5 mg total) by mouth daily., Disp: 30 tablet, Rfl: 0;  Rivaroxaban (XARELTO) 20 MG TABS, Take 1 tablet (20 mg total) by mouth daily., Disp: 30 tablet, Rfl: 2  BP 135/86  Pulse 97  Resp 16  Ht 5' 4.5" (1.638 m)  Wt 187 lb (84.823 kg)  BMI 31.61 kg/m2  Body mass index is 31.61 kg/(m^2).        Review of Systems denies chest pain, dyspnea on exertion, PND, orthopnea, claudication. Also denies lateralizing weakness, aphasia, numerous fugax, syncope. Other systems negative and complete review of systems    Objective:   Physical Exam blood pressure 135/86 heart rate 97 respirations 16 Gen.-alert and oriented x3 in no apparent distress HEENT normal for age Lungs no rhonchi or wheezing Cardiovascular regular rhythm no murmurs carotid pulses 3+ palpable no bruits audible Abdomen soft nontender no palpable masses Musculoskeletal free of  major deformities Skin clear -no rashes Neurologic normal Lower extremities 3+ femoral and dorsalis pedis pulses palpable bilaterally with no edema on right 1+ edema on left. Thrombosed varicosities are palpable in the distal thigh the left leg over the great saphenous system extending down into  the proximal medial calf. There are multiple spider and reticular veins in the left ankle region with no active ulceration. Right leg is free of significant varicosities.  Today I ordered a venous duplex exam of the left leg which are reviewed and interpreted. Great saphenous vein has gross reflux from the junction to the distal thigh and the thrombosed varicosities are visualized in the distal thigh and proximal calf. There is no DVT.     Assessment:     Recent episode of acute thrombophlebitis with thrombosed varicosities left thigh and calf secondary to gross  reflux left great saphenous system. No DVT.    Plan:     #1 continue long-leg elastic compression stockings 20-30 mm gradient #2 elevate legs as much as possible during the day #3 ibuprofen on a regular basis #4 continue Xeralto times one more week and then began daily aspirin #5 return in 3 months-if no significant improvement will need laser ablation left great saphenous system

## 2012-07-05 ENCOUNTER — Ambulatory Visit (INDEPENDENT_AMBULATORY_CARE_PROVIDER_SITE_OTHER): Payer: Medicare PPO | Admitting: Family Medicine

## 2012-07-05 ENCOUNTER — Encounter: Payer: Self-pay | Admitting: Family Medicine

## 2012-07-05 VITALS — BP 148/80 | HR 96 | Temp 97.9°F | Resp 16 | Ht 64.5 in | Wt 184.8 lb

## 2012-07-05 DIAGNOSIS — I809 Phlebitis and thrombophlebitis of unspecified site: Secondary | ICD-10-CM

## 2012-07-05 NOTE — Progress Notes (Signed)
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  She is much improved and feels that since going on meds and purchasing Jobst type compressions stockings, she is much better.  No chest pain or new shortness of breath  Currently on Xarelto and ASA 81 mg for another few days, to be followed by ASA 81 mg daily and Xarelto 20 mg prior to international flight planned for the late summer.  She has finished her Physical Therapy and is walking without problem.  Objective:  Wearing Jobst type stockings Left medial knee varicosity without erythema, tenderness  Assessment:  Doing well resolving the thrombophlebitis.  Some arthritis persists, but overall patient is doing well.  Plan:  Finish 2 weeks of Xarelto, then take ASA 81 mg daily Continue the compression stockings Xarelto x 1 prior to long airplane or bus trips.  Signed, [name], MD

## 2012-10-24 ENCOUNTER — Ambulatory Visit (INDEPENDENT_AMBULATORY_CARE_PROVIDER_SITE_OTHER): Payer: Medicare PPO | Admitting: Family Medicine

## 2012-10-24 VITALS — BP 134/60 | HR 98 | Temp 98.1°F | Resp 17 | Ht 64.0 in | Wt 182.0 lb

## 2012-10-24 DIAGNOSIS — R5383 Other fatigue: Secondary | ICD-10-CM

## 2012-10-24 DIAGNOSIS — M545 Low back pain, unspecified: Secondary | ICD-10-CM

## 2012-10-24 DIAGNOSIS — R5381 Other malaise: Secondary | ICD-10-CM

## 2012-10-24 DIAGNOSIS — R3915 Urgency of urination: Secondary | ICD-10-CM

## 2012-10-24 LAB — POCT CBC
Granulocyte percent: 65.8 % (ref 37–80)
HCT, POC: 41.3 % (ref 37.7–47.9)
Hemoglobin: 13 g/dL (ref 12.2–16.2)
Lymph, poc: 1.6 (ref 0.6–3.4)
MCH, POC: 25.6 pg — AB (ref 27–31.2)
MCHC: 31.5 g/dL — AB (ref 31.8–35.4)
MCV: 81.4 fL (ref 80–97)
MID (cbc): 0.5 (ref 0–0.9)
MPV: 7.3 fL (ref 0–99.8)
POC Granulocyte: 4.1 (ref 2–6.9)
POC LYMPH PERCENT: 25.9 %L (ref 10–50)
POC MID %: 8.3 %M (ref 0–12)
Platelet Count, POC: 389 10*3/uL (ref 142–424)
RBC: 5.07 M/uL (ref 4.04–5.48)
RDW, POC: 15.8 %
WBC: 6.2 10*3/uL (ref 4.6–10.2)

## 2012-10-24 LAB — POCT URINALYSIS DIPSTICK
Bilirubin, UA: NEGATIVE
Glucose, UA: NEGATIVE
Leukocytes, UA: NEGATIVE
Nitrite, UA: NEGATIVE
Urobilinogen, UA: 0.2

## 2012-10-24 LAB — POCT UA - MICROSCOPIC ONLY: Yeast, UA: NEGATIVE

## 2012-10-24 LAB — COMPREHENSIVE METABOLIC PANEL
AST: 11 U/L (ref 0–37)
BUN: 14 mg/dL (ref 6–23)
Calcium: 9.3 mg/dL (ref 8.4–10.5)
Chloride: 102 mEq/L (ref 96–112)
Creat: 0.75 mg/dL (ref 0.50–1.10)
Total Bilirubin: 0.3 mg/dL (ref 0.3–1.2)

## 2012-10-24 LAB — COMPREHENSIVE METABOLIC PANEL WITH GFR
ALT: 12 U/L (ref 0–35)
Albumin: 3.7 g/dL (ref 3.5–5.2)
Alkaline Phosphatase: 78 U/L (ref 39–117)
CO2: 29 meq/L (ref 19–32)
Glucose, Bld: 99 mg/dL (ref 70–99)
Potassium: 4.1 meq/L (ref 3.5–5.3)
Sodium: 138 meq/L (ref 135–145)
Total Protein: 6.6 g/dL (ref 6.0–8.3)

## 2012-10-24 LAB — POCT GLYCOSYLATED HEMOGLOBIN (HGB A1C): Hemoglobin A1C: 6

## 2012-10-24 NOTE — Patient Instructions (Addendum)

## 2012-10-24 NOTE — Progress Notes (Signed)
[facility]:  Office Visit  Chief Complaint:  Chief Complaint  Patient presents with  . Urinary Tract Infection    urge and lower back pain x2 weeks     HPI: Renee Young is a 67 y.o. female who complains of 2 weeks ago had UTI sxs urgency and took some cranberry like pills in [place]. She was travelling and felt she was dehydrated, She also fell and hit her head but denies any neuro sxs since the fall 2 weeks ago. She returned home from [place] 3 days ago, Sunday night and Monday this AM was in bed from fatigue, She has been eating and drinking normally. She denies fevers, chill, n/v/abd apin, dysuria or vaginal dc. Just feels tired. Again no neuro sxs from fall. She did not have LOC.   Past Medical History  Diagnosis Date  . Allergy   . Anemia   . Arthritis   . Depression   . Diabetes mellitus without complication   . UTI (urinary tract infection)   . Kidney stones   . ADD (attention deficit disorder)    Past Surgical History  Procedure Laterality Date  . Cesarean section    . Tubal ligation    . Ureterocele incision     History   Social History  . Marital Status: Married    Spouse Name: N/A    Number of Children: N/A  . Years of Education: N/A   Social History Main Topics  . Smoking status: Never Smoker   . Smokeless tobacco: Never Used  . Alcohol Use: 1.2 oz/week    2 Glasses of wine per week  . Drug Use: No  . Sexual Activity: Yes   Other Topics Concern  . None   Social History Narrative  . None   Family History  Problem Relation Age of Onset  . Cancer Mother   . Kidney disease Mother   . Diabetes Father   . Arthritis Sister   . Obesity Brother   . Arthritis Brother   . Heart disease Maternal Grandmother   . Stroke Maternal Grandmother   . Hypertension Maternal Grandmother   . Heart disease Maternal Grandfather   . Hyperlipidemia Maternal Grandfather   . Stroke Paternal Grandmother   . Heart disease Paternal Grandmother   . Heart  disease Paternal Grandfather   . Heart attack Paternal Grandfather   . Arthritis Daughter    Allergies  Allergen Reactions  . Nitrofurantoin   . Penicillins    Prior to Admission medications   Medication Sig Start Date End Date Taking? Authorizing Provider  amphetamine-dextroamphetamine (ADDERALL) 30 MG tablet Take 30 mg by mouth 2 (two) times daily.   Yes Historical Provider, MD  FLUoxetine (PROZAC) 20 MG capsule Take 20 mg by mouth 3 (three) times daily.   Yes Historical Provider, MD  meloxicam (MOBIC) 7.5 MG tablet Take 1 tablet (7.5 mg total) by mouth daily. 06/27/12   [name], MD  Rivaroxaban (XARELTO) 20 MG TABS Take 1 tablet (20 mg total) by mouth daily. 06/24/12   [name], MD     ROS: The patient denies fevers, chills, night sweats, unintentional weight loss, chest pain, palpitations, wheezing, dyspnea on exertion, nausea, vomiting, abdominal pain, dysuria, hematuria, melena, numbness, , or tingling.  All other systems have been reviewed and were otherwise negative with the exception of those mentioned in the HPI and as above.    PHYSICAL EXAM: Filed Vitals:   10/24/12 1024  BP: 134/60  Pulse:  98  Temp: 98.1 F (36.7 C)  Resp: 17   Filed Vitals:   10/24/12 1024  Height: 5\' 4"  (1.626 m)  Weight: 182 lb (82.555 kg)   Body mass index is 31.22 kg/(m^2).  General: Alert, no acute distress HEENT:  Normocephalic, atraumatic, oropharynx patent. EOMI, PERRLA, fundoscopic exam nl. Tm nl.  Cardiovascular:  Regular rate and rhythm, no rubs murmurs or gallops.  No Carotid bruits, radial pulse intact. No pedal edema.  Respiratory: Clear to auscultation bilaterally.  No wheezes, rales, or rhonchi.  No cyanosis, no use of accessory musculature GI: No organomegaly, abdomen is soft and non-tender, positive bowel sounds.  No masses. Skin: No rashes. Neurologic: Facial musculature symmetric. CN 2-12 grossly nl.  Psychiatric: Patient is appropriate throughout our  interaction. Lymphatic: No cervical lymphadenopathy Musculoskeletal: Gait intact.   LABS: Results for orders placed in visit on 10/24/12  POCT URINALYSIS DIPSTICK      Result Value Range   Color, UA yellow     Clarity, UA clear     Glucose, UA neg     Bilirubin, UA neg     Ketones, UA neg     Spec Grav, UA 1.025     Blood, UA large     pH, UA 5.5     Protein, UA neg     Urobilinogen, UA 0.2     Nitrite, UA neg     Leukocytes, UA Negative    POCT UA - MICROSCOPIC ONLY      Result Value Range   WBC, Ur, HPF, POC 0-2     RBC, urine, microscopic 3-8     Bacteria, U Microscopic trace     Mucus, UA positive     Epithelial cells, urine per micros 0-1     Crystals, Ur, HPF, POC neg     Casts, Ur, LPF, POC neg     Yeast, UA neg    POCT CBC      Result Value Range   WBC 6.2  4.6 - 10.2 K/uL   Lymph, poc 1.6  0.6 - 3.4   POC LYMPH PERCENT 25.9  10 - 50 %L   MID (cbc) 0.5  0 - 0.9   POC MID % 8.3  0 - 12 %M   POC Granulocyte 4.1  2 - 6.9   Granulocyte percent 65.8  37 - 80 %G   RBC 5.07  4.04 - 5.48 M/uL   Hemoglobin 13.0  12.2 - 16.2 g/dL   HCT, POC 16.1  09.6 - 47.9 %   MCV 81.4  80 - 97 fL   MCH, POC 25.6 (*) 27 - 31.2 pg   MCHC 31.5 (*) 31.8 - 35.4 g/dL   RDW, POC 04.5     Platelet Count, POC 389  142 - 424 K/uL   MPV 7.3  0 - 99.8 fL  POCT GLYCOSYLATED HEMOGLOBIN (HGB A1C)      Result Value Range   Hemoglobin A1C 6.0       EKG/XRAY:   Primary read interpreted by Dr. Conley Rolls at [facility].   ASSESSMENT/PLAN: Encounter Diagnoses  Name Primary?  . Urgency of urination Yes  . Pain in lower back   . Fatigue    I think she just has jet lag, her exam was unremarkable, Will await for labs, advise to rest and drink plenty of fluids F/u prn Gross sideeffects, risk and benefits, and alternatives of medications d/w patient. Patient is aware that all medications have potential sideeffects and  we are unable to predict every sideeffect or drug-drug interaction that may  occur.  Hamilton Capri PHUONG, DO 10/24/2012 12:33 PM

## 2012-10-26 ENCOUNTER — Encounter: Payer: Self-pay | Admitting: Family Medicine

## 2012-11-06 ENCOUNTER — Encounter: Payer: Self-pay | Admitting: Vascular Surgery

## 2012-11-07 ENCOUNTER — Ambulatory Visit (INDEPENDENT_AMBULATORY_CARE_PROVIDER_SITE_OTHER): Payer: Medicare FFS | Admitting: Vascular Surgery

## 2012-11-07 ENCOUNTER — Encounter: Payer: Self-pay | Admitting: Vascular Surgery

## 2012-11-07 ENCOUNTER — Ambulatory Visit: Payer: Medicare FFS | Admitting: Vascular Surgery

## 2012-11-07 VITALS — BP 172/91 | HR 108 | Resp 18 | Ht 64.0 in | Wt 182.0 lb

## 2012-11-07 DIAGNOSIS — I83893 Varicose veins of bilateral lower extremities with other complications: Secondary | ICD-10-CM

## 2012-11-07 NOTE — Progress Notes (Signed)
Subjective:     Patient ID: Renee Young, female   DOB: September 04, 1945, 67 y.o.   MRN: [id number]  HPI this 67 year old female returns for continued followup regarding her severe venous insufficiency of the left leg. She had thrombophlebitis involving the distal thigh and medial calf about 4 months ago and was treated for a brief period with anti-coagulation. This has gradually resolved over the past 4 months. She has been wearing long light elastic compression stockings and trying elevation and ibuprofen but continues to have aching and throbbing discomfort in the thigh and calf area. She also had a bleeding episode about 6 days ago when she had some mild trauma to the left pretibial region where there are some underlying reticular veins in the bleeding was quite brisk.  Past Medical History  Diagnosis Date  . Allergy   . Anemia   . Arthritis   . Depression   . Diabetes mellitus without complication   . UTI (urinary tract infection)   . Kidney stones   . ADD (attention deficit disorder)     History  Substance Use Topics  . Smoking status: Never Smoker   . Smokeless tobacco: Never Used  . Alcohol Use: 1.2 oz/week    2 Glasses of wine per week    Family History  Problem Relation Age of Onset  . Cancer Mother   . Kidney disease Mother   . Diabetes Father   . Arthritis Sister   . Obesity Brother   . Arthritis Brother   . Heart disease Maternal Grandmother   . Stroke Maternal Grandmother   . Hypertension Maternal Grandmother   . Heart disease Maternal Grandfather   . Hyperlipidemia Maternal Grandfather   . Stroke Paternal Grandmother   . Heart disease Paternal Grandmother   . Heart disease Paternal Grandfather   . Heart attack Paternal Grandfather   . Arthritis Daughter     Allergies  Allergen Reactions  . Nitrofurantoin   . Penicillins     Current outpatient prescriptions:amphetamine-dextroamphetamine (ADDERALL) 30 MG tablet, Take 30 mg by mouth 2 (two) times daily., Disp: ,  Rfl: ;  FLUoxetine (PROZAC) 20 MG capsule, Take 20 mg by mouth 3 (three) times daily., Disp: , Rfl: ;  meloxicam (MOBIC) 7.5 MG tablet, Take 1 tablet (7.5 mg total) by mouth daily., Disp: 30 tablet, Rfl: 0;  Rivaroxaban (XARELTO) 20 MG TABS, Take 1 tablet (20 mg total) by mouth daily., Disp: 30 tablet, Rfl: 2  BP 172/91  Pulse 108  Resp 18  Ht 5\' 4"  (1.626 m)  Wt 182 lb (82.555 kg)  BMI 31.22 kg/m2  Body mass index is 31.22 kg/(m^2).         Review of Systems denies chest pain, dyspnea on exertion, PND, orthopnea,    Objective:   Physical Exam BP 172/91  Pulse 108  Resp 18  Ht 5\' 4"  (1.626 m)  Wt 182 lb (82.555 kg)  BMI 31.22 kg/m2  General well-developed well-nourished female no apparent stress alert and oriented x3 Lungs no rhonchi or wheezing Left lower extremity with bulging varicosities beginning at the knee extending into the mid calf with a large network of reticular veins in the left distal pretibial region and 1+ chronic edema. There is a slowly healing ulceration where she traumatized the pretibial region recently. 3+ dorsalis pedis pulse palpable.      Assessment:     Severe venous insufficiency left leg with history of thrombophlebitis and a bleeding episode-documented gross reflux left great saphenous system  with no DVT-symptoms are affecting patient's daily living and are resistant to conservative measures    Plan:     Patient needs laser ablation left great saphenous vein and then to return in 3 months for possible stab phlebectomy and sclerotherapy depending on situation We'll proceed with precertification to perform this in the near future

## 2012-11-21 ENCOUNTER — Other Ambulatory Visit: Payer: Self-pay | Admitting: *Deleted

## 2012-11-21 DIAGNOSIS — I83893 Varicose veins of bilateral lower extremities with other complications: Secondary | ICD-10-CM

## 2012-12-08 ENCOUNTER — Encounter: Payer: Self-pay | Admitting: Vascular Surgery

## 2012-12-11 ENCOUNTER — Ambulatory Visit (INDEPENDENT_AMBULATORY_CARE_PROVIDER_SITE_OTHER): Payer: Medicare FFS | Admitting: Vascular Surgery

## 2012-12-11 ENCOUNTER — Encounter (INDEPENDENT_AMBULATORY_CARE_PROVIDER_SITE_OTHER): Payer: Self-pay

## 2012-12-11 ENCOUNTER — Encounter: Payer: Self-pay | Admitting: Vascular Surgery

## 2012-12-11 VITALS — BP 144/85 | HR 99 | Resp 16 | Ht 64.0 in | Wt 180.0 lb

## 2012-12-11 DIAGNOSIS — I83893 Varicose veins of bilateral lower extremities with other complications: Secondary | ICD-10-CM

## 2012-12-11 NOTE — Progress Notes (Signed)
Laser Ablation Procedure      Date: 12/11/2012    Renee Young DOB:Jun 16, 1945  Consent signed: Yes  Surgeon:J.D. Hart [place]  Procedure: Laser Ablation: left Greater Saphenous Vein  BP 144/85  Pulse 99  Resp 16  Ht 5\' 4"  (1.626 m)  Wt 180 lb (81.647 kg)  BMI 30.88 kg/m2  Start time: 2:50   End time: 3:30  Tumescent Anesthesia: 300 cc 0.9% NaCl with 50 cc Lidocaine HCL with 1% Epi and 15 cc 8.4% NaHCO3  Local Anesthesia: 4 cc Lidocaine HCL and NaHCO3 (ratio 2:1)  Pulsed mode 15 watts, delay, 1.0 duration Total energy: 2128, total pulses: 143, total time: 2:22      Patient tolerated procedure well: Yes  Notes:   Description of Procedure:  After marking the course of the saphenous vein and the secondary varicosities in the standing position, the patient was placed on the operating table in the supine position, and the left leg was prepped and draped in sterile fashion. Local anesthetic was administered, and under ultrasound guidance the saphenous vein was accessed with a micro needle and guide wire; then the micro puncture sheath was placed. A guide wire was inserted to the saphenofemoral junction, followed by a 5 french sheath.  The position of the sheath and then the laser fiber below the junction was confirmed using the ultrasound and visualization of the aiming beam.  Tumescent anesthesia was administered along the course of the saphenous vein using ultrasound guidance. Protective laser glasses were placed on the patient, and the laser was fired at pulsed mode 15 watts  For a total of 2128 joules.  A steri strip was applied to the puncture site.    ABD pads and thigh high compression stockings were applied.  Ace wrap bandages were applied over the phlebectomy sites and at the top of the saphenofemoral junction.  Blood loss was less than 15 cc.  The patient ambulated out of the operating room having tolerated the procedure well.

## 2012-12-11 NOTE — Progress Notes (Signed)
Subjective:     Patient ID: Renee Young, female   DOB: 01/06/1946, 67 y.o.   MRN: [id number]  HPI this 67 year old female had laser ablation left great saphenous vein performed under local tumescent anesthesia for venous hypertension with painful varicosities due to gross reflux in the left great saphenous system. Total 2120 J of energy was utilized and she tolerated the procedure well.   Review of Systems     Objective:   Physical Exam BP 144/85  Pulse 99  Resp 16  Ht 5\' 4"  (1.626 m)  Wt 180 lb (81.647 kg)  BMI 30.88 kg/m2       Assessment:     Well-tolerated laser ablation left great saphenous vein performed under local tumescent anesthesia    Plan:     Return in one week for venous duplex exam to confirm closure left great saphenous vein

## 2012-12-12 ENCOUNTER — Encounter: Payer: Self-pay | Admitting: Vascular Surgery

## 2012-12-12 ENCOUNTER — Telehealth: Payer: Self-pay | Admitting: *Deleted

## 2012-12-12 NOTE — Telephone Encounter (Signed)
Left a message on her phone

## 2012-12-18 ENCOUNTER — Encounter: Payer: Self-pay | Admitting: Vascular Surgery

## 2012-12-19 ENCOUNTER — Other Ambulatory Visit: Payer: Self-pay | Admitting: Vascular Surgery

## 2012-12-19 ENCOUNTER — Encounter: Payer: Self-pay | Admitting: Vascular Surgery

## 2012-12-19 ENCOUNTER — Ambulatory Visit (HOSPITAL_COMMUNITY)
Admission: RE | Admit: 2012-12-19 | Discharge: 2012-12-19 | Disposition: A | Discharge status: Home / Self Care | Payer: Medicare FFS | Source: Ambulatory Visit | Attending: Vascular Surgery | Admitting: Vascular Surgery

## 2012-12-19 ENCOUNTER — Ambulatory Visit (INDEPENDENT_AMBULATORY_CARE_PROVIDER_SITE_OTHER): Payer: Medicare FFS | Admitting: Vascular Surgery

## 2012-12-19 VITALS — BP 127/78 | HR 108 | Resp 16 | Ht 64.0 in | Wt 180.0 lb

## 2012-12-19 DIAGNOSIS — I83893 Varicose veins of bilateral lower extremities with other complications: Secondary | ICD-10-CM | POA: Insufficient documentation

## 2012-12-19 NOTE — Progress Notes (Signed)
Subjective:     Patient ID: Renee Young, female   DOB: 02-09-1946, 67 y.o.   MRN: [id number]  HPI this 67 year old female is one week post laser ablation left great saphenous vein for painful varicosities in the left calf with venous hypertension. She tolerated surgery well. She is has mild-to-moderate discomfort in the medial thigh which has improved over the last few days. She did stop taking her ibuprofen a little prematurely. She is wearing elastic compression stocking.   Past Medical History  Diagnosis Date  . Allergy   . Anemia   . Arthritis   . Depression   . Diabetes mellitus without complication   . UTI (urinary tract infection)   . Kidney stones   . ADD (attention deficit disorder)     History  Substance Use Topics  . Smoking status: Never Smoker   . Smokeless tobacco: Never Used  . Alcohol Use: 1.2 oz/week    2 Glasses of wine per week    Family History  Problem Relation Age of Onset  . Cancer Mother   . Kidney disease Mother   . Diabetes Father   . Arthritis Sister   . Obesity Brother   . Arthritis Brother   . Heart disease Maternal Grandmother   . Stroke Maternal Grandmother   . Hypertension Maternal Grandmother   . Heart disease Maternal Grandfather   . Hyperlipidemia Maternal Grandfather   . Stroke Paternal Grandmother   . Heart disease Paternal Grandmother   . Heart disease Paternal Grandfather   . Heart attack Paternal Grandfather   . Arthritis Daughter     Allergies  Allergen Reactions  . Nitrofurantoin   . Penicillins     Current outpatient prescriptions:amphetamine-dextroamphetamine (ADDERALL) 30 MG tablet, Take 30 mg by mouth 2 (two) times daily., Disp: , Rfl: ;  FLUoxetine (PROZAC) 20 MG capsule, Take 20 mg by mouth 3 (three) times daily., Disp: , Rfl: ;  meloxicam (MOBIC) 7.5 MG tablet, Take 1 tablet (7.5 mg total) by mouth daily., Disp: 30 tablet, Rfl: 0;  Rivaroxaban (XARELTO) 20 MG TABS, Take 1 tablet (20 mg total) by mouth daily., Disp: 30  tablet, Rfl: 2  BP 127/78  Pulse 108  Resp 16  Ht 5\' 4"  (1.626 m)  Wt 180 lb (81.647 kg)  BMI 30.88 kg/m2  Body mass index is 30.88 kg/(m^2).           Review of Systems denies chest pain, dyspnea on exertion, PND, orthopnea, hemoptysis    Objective:   Physical Exam BP 127/78  Pulse 108  Resp 16  Ht 5\' 4"  (1.626 m)  Wt 180 lb (81.647 kg)  BMI 30.88 kg/m2  General well-developed well-nourished female in no apparent stress alert and oriented x3 Lungs no rhonchi or wheezing Left leg with mild discomfort along the course of great saphenous vein medially. No distal edema noted. Varicosities present from knee to ankle area.  Today I ordered a venous duplex exam of the left leg which are reviewed and interpreted. Left great saphenous vein is totally closed up to a point near the saphenofemoral junction. There is a patent anterior excess 3 branch of the great saphenous vein which has reflux and is of significant size. There is no DVT.      Assessment:     Successful laser ablation left great saphenous vein for painful varicosities with patent anterior excess 3 branch which appears to have gross reflux-no DVT    Plan:     Return in 3  months with repeat venous duplex exam left leg to see if anterior excess 3 branch continues to be an issue with significant size and reflux and also to evaluate for possible stab phlebectomy

## 2012-12-20 NOTE — Addendum Note (Signed)
Addended by: Sharee Pimple on: 12/20/2012 08:17 AM   Modules accepted: Orders

## 2012-12-26 ENCOUNTER — Telehealth: Payer: Self-pay

## 2012-12-26 DIAGNOSIS — N63 Unspecified lump in unspecified breast: Secondary | ICD-10-CM

## 2012-12-26 NOTE — Telephone Encounter (Signed)
Dr. Milus Glazier:  Patient would like a referral for a mammogram please.  Call her at [phone] to discuss

## 2012-12-26 NOTE — Telephone Encounter (Signed)
Is she having problems? Or does she need screening? Called her and she has noticed a difference in the left breast, feels like grains of sand under skin. Pended order for diagnostic mammogram, and [place], she may need this as well.

## 2012-12-26 NOTE — Telephone Encounter (Signed)
Please have her call the breast center for a screening mammogram.  If she is very concerned, come in to discuss.

## 2012-12-27 NOTE — Telephone Encounter (Addendum)
She can not have screening since her breast exam is different, will need to be diagnostic. I see you have signed the orders. Called patient. Advised orders sent.

## 2013-01-02 ENCOUNTER — Ambulatory Visit
Admission: RE | Admit: 2013-01-02 | Discharge: 2013-01-02 | Disposition: A | Discharge status: Home / Self Care | Payer: Medicare PPO | Source: Ambulatory Visit | Attending: Family Medicine | Admitting: Family Medicine

## 2013-01-02 DIAGNOSIS — N63 Unspecified lump in unspecified breast: Secondary | ICD-10-CM

## 2013-03-19 ENCOUNTER — Encounter: Payer: Self-pay | Admitting: Vascular Surgery

## 2013-03-20 ENCOUNTER — Ambulatory Visit (HOSPITAL_COMMUNITY)
Admission: RE | Admit: 2013-03-20 | Discharge: 2013-03-20 | Disposition: A | Discharge status: Home / Self Care | Payer: Medicare PPO | Source: Ambulatory Visit | Attending: Vascular Surgery | Admitting: Vascular Surgery

## 2013-03-20 ENCOUNTER — Ambulatory Visit (INDEPENDENT_AMBULATORY_CARE_PROVIDER_SITE_OTHER): Payer: Medicare PPO | Admitting: Vascular Surgery

## 2013-03-20 ENCOUNTER — Encounter: Payer: Self-pay | Admitting: Vascular Surgery

## 2013-03-20 ENCOUNTER — Encounter (INDEPENDENT_AMBULATORY_CARE_PROVIDER_SITE_OTHER): Payer: Self-pay

## 2013-03-20 VITALS — BP 149/68 | HR 104 | Resp 16 | Ht 64.5 in | Wt 187.0 lb

## 2013-03-20 DIAGNOSIS — I83893 Varicose veins of bilateral lower extremities with other complications: Secondary | ICD-10-CM

## 2013-03-20 DIAGNOSIS — I82819 Embolism and thrombosis of superficial veins of unspecified lower extremities: Secondary | ICD-10-CM | POA: Insufficient documentation

## 2013-03-20 DIAGNOSIS — Z09 Encounter for follow-up examination after completed treatment for conditions other than malignant neoplasm: Secondary | ICD-10-CM | POA: Insufficient documentation

## 2013-03-20 NOTE — Progress Notes (Signed)
Subjective:     Patient ID: Renee Young, female   DOB: 09/07/1945, 68 y.o.   MRN: [id number]  HPI this 68 year old female returns for continued followup regarding her venous insufficiency of the left leg. She underwent laser ablation left great saphenous vein 3 months ago for bulging painful varicosities in the thigh and calf area. She states the leg feels dramatically better in the swelling distally has improved. The varicosities are much less prominent she states and she is not having any discomfort.  Past Medical History  Diagnosis Date  . Allergy   . Anemia   . Arthritis   . Depression   . Diabetes mellitus without complication   . UTI (urinary tract infection)   . Kidney stones   . ADD (attention deficit disorder)     History  Substance Use Topics  . Smoking status: Never Smoker   . Smokeless tobacco: Never Used  . Alcohol Use: 1.2 oz/week    2 Glasses of wine per week    Family History  Problem Relation Age of Onset  . Cancer Mother   . Kidney disease Mother   . Diabetes Father   . Arthritis Sister   . Obesity Brother   . Arthritis Brother   . Heart disease Maternal Grandmother   . Stroke Maternal Grandmother   . Hypertension Maternal Grandmother   . Heart disease Maternal Grandfather   . Hyperlipidemia Maternal Grandfather   . Stroke Paternal Grandmother   . Heart disease Paternal Grandmother   . Heart disease Paternal Grandfather   . Heart attack Paternal Grandfather   . Arthritis Daughter     Allergies  Allergen Reactions  . Nitrofurantoin   . Penicillins     Current outpatient prescriptions:amphetamine-dextroamphetamine (ADDERALL) 30 MG tablet, Take 30 mg by mouth 2 (two) times daily., Disp: , Rfl: ;  FLUoxetine (PROZAC) 20 MG capsule, Take 20 mg by mouth 3 (three) times daily., Disp: , Rfl: ;  meloxicam (MOBIC) 7.5 MG tablet, Take 1 tablet (7.5 mg total) by mouth daily., Disp: 30 tablet, Rfl: 0;  Rivaroxaban (XARELTO) 20 MG TABS, Take 1 tablet (20 mg  total) by mouth daily., Disp: 30 tablet, Rfl: 2  BP 149/68  Pulse 104  Resp 16  Ht 5' 4.5" (1.638 m)  Wt 187 lb (84.823 kg)  BMI 31.61 kg/m2  Body mass index is 31.61 kg/(m^2).           Review of Systems denies chest pain, dyspnea on exertion, PND, orthopnea     Objective:   Physical Exam BP 149/68  Pulse 104  Resp 16  Ht 5' 4.5" (1.638 m)  Wt 187 lb (84.823 kg)  BMI 31.61 kg/m2  General well-developed well-nourished female no apparent distress alert and oriented x3 Lungs no rhonchi or wheezing Left leg with no distal edema. Reticular veins are evident in the lower third of the leg around the malleolar. A few bulging varicosities in the distal thigh and medial calf which are less prominent than previously. 3+ dorsalis pedis pulse palpable.  Today our venous duplex exam which I reviewed and interpreted. There is no DVT. Left great saphenous vein was totally closed and the anterior accessory branch is small and much less prominent than previously     Assessment:     Successful laser ablation left great saphenous vein with resolution of symptoms left leg-no need for further procedures at this time    Plan:     Return to see us on when necessary  basis

## 2013-07-27 ENCOUNTER — Ambulatory Visit (INDEPENDENT_AMBULATORY_CARE_PROVIDER_SITE_OTHER): Payer: Medicare PPO | Admitting: Family Medicine

## 2013-07-27 ENCOUNTER — Ambulatory Visit (INDEPENDENT_AMBULATORY_CARE_PROVIDER_SITE_OTHER): Payer: Medicare PPO

## 2013-07-27 VITALS — BP 126/71 | HR 85 | Temp 98.8°F | Resp 16 | Ht 64.0 in | Wt 189.4 lb

## 2013-07-27 DIAGNOSIS — S8390XA Sprain of unspecified site of unspecified knee, initial encounter: Secondary | ICD-10-CM

## 2013-07-27 DIAGNOSIS — IMO0002 Reserved for concepts with insufficient information to code with codable children: Secondary | ICD-10-CM

## 2013-07-27 DIAGNOSIS — M25569 Pain in unspecified knee: Secondary | ICD-10-CM

## 2013-07-27 NOTE — Patient Instructions (Signed)
[facility]  Please take your CD to your appointment.  Use the knee brace as needed for stability.  Avoid doing anything to really bothers your knee such as kneeling or deep bending.  However otherwise you can do your regular activities.   Ice and elevate you knee(s) as needed.  If you need anything else in the meantime please let me know!

## 2013-07-27 NOTE — Progress Notes (Signed)
[facility] [address], [place] [id number] [phone] [phone]- [phone]  Date:  07/27/2013   Name:  Renee Young   DOB:  10/05/1945   MRN:  [id number]  PCP:  Elvina SidleLAUENSTEIN,KURT, MD    Chief Complaint: Knee Injury   History of Present Illness:  Renee Young is a 68 y.o. very pleasant female patient who presents with the following:  Here today with an injury.  She has had some trouble with her knees in the past; she has hyperextended her knees on occasion, and has noted some intermittent pain.  She has not had an MRI of her knees that she can recall.  She has not had surgery, but has had injections in her knees in the past.  She does have a history of OA in her knees and is used to that, but this pain seems different.    Today is Friday- on Monday she stepped on an uneven spot and her right knee seemed to buckle. She was able to catch herself from falling and tried just to rest the last few days, seemed to be improving. Yesterday she had to drive a good distance and her knee got worse.    She is able to walk ok, but then will have sudden and unexpected pain if she steps on an uneven spot. She does not really describe a catching in instability.  She had noted some swelling over the last few days.   Most recent A1c 6% in the fall.  She did have DM, but this is now"gone"-  controlled with diet only.    Patient Active Problem List   Diagnosis Date Noted  . Varicose veins of lower extremities with other complications 07/03/2012  . Leg swelling 07/03/2012  . Pain in limb 07/03/2012  . OSTEOARTHRITIS 03/21/2009  . VIRAL URI 02/27/2008  . OTHER VIRAL CONJUNCTIVITIS 01/23/2008  . DIABETES MELLITUS, TYPE II 07/19/2007  . HYPERLIPIDEMIA 07/19/2007  . DEPRESSION 07/19/2007  . ATTENTION DEFICIT DISORDER 07/19/2007  . ASYMPTOMATIC VARICOSE VEINS 07/19/2007    Past Medical History  Diagnosis Date  . Allergy   . Anemia   . Arthritis   . Depression   . Diabetes mellitus without  complication   . UTI (urinary tract infection)   . Kidney stones   . ADD (attention deficit disorder)     Past Surgical History  Procedure Laterality Date  . Cesarean section    . Tubal ligation    . Ureterocele incision      History  Substance Use Topics  . Smoking status: Never Smoker   . Smokeless tobacco: Never Used  . Alcohol Use: 1.2 oz/week    2 Glasses of wine per week    Family History  Problem Relation Age of Onset  . Cancer Mother   . Kidney disease Mother   . Diabetes Father   . Arthritis Sister   . Obesity Brother   . Arthritis Brother   . Heart disease Maternal Grandmother   . Stroke Maternal Grandmother   . Hypertension Maternal Grandmother   . Heart disease Maternal Grandfather   . Hyperlipidemia Maternal Grandfather   . Stroke Paternal Grandmother   . Heart disease Paternal Grandmother   . Heart disease Paternal Grandfather   . Heart attack Paternal Grandfather   . Arthritis Daughter     Allergies  Allergen Reactions  . Nitrofurantoin   . Penicillins     Medication list has been reviewed and updated.  Current Outpatient  Prescriptions on File Prior to Visit  Medication Sig Dispense Refill  . amphetamine-dextroamphetamine (ADDERALL) 30 MG tablet Take 30 mg by mouth 2 (two) times daily.      Marland Kitchen. FLUoxetine (PROZAC) 20 MG capsule Take 20 mg by mouth 3 (three) times daily.       No current facility-administered medications on file prior to visit.    Review of Systems:  As per HPI- otherwise negative.   Physical Examination: Filed Vitals:   07/27/13 0912  BP: 126/71  Pulse: 85  Temp: 98.8 F (37.1 C)  Resp: 16   Filed Vitals:   07/27/13 0912  Height: 5\' 4"  (1.626 m)  Weight: 189 lb 6.4 oz (85.911 kg)   Body mass index is 32.49 kg/(m^2). Ideal Body Weight: Weight in (lb) to have BMI = 25: 145.3  GEN: WDWN, NAD, Non-toxic, A & O x 3, overweight, appears active and well HEENT: Atraumatic, Normocephalic. Neck supple. No masses, No  LAD. Ears and Nose: No external deformity. CV: RRR, No M/G/R. No JVD. No thrill. No extra heart sounds. PULM: CTA B, no wheezes, crackles, rhonchi. No retractions. No resp. distress. No accessory muscle use. ABD: S, NT, ND, +BS. No rebound. No HSM. EXTR: No c/c/e NEURO Normal gait.  PSYCH: Normally interactive. Conversant. Not depressed or anxious appearing.  Calm demeanor.  Left knee: crepitus, otherwise stable and no acute tenderness Right knee: trace effusion.  Crepitus but seems stable.  No abnormal laxity of the ACL.  She is tender along the medial and lateral joint lines.    [facility] reading (PRIMARY) by  Dr. Patsy Lageropland. Right knee: significant degenerative change with loose body Left knee: significant degenerative change  RIGHT KNEE - COMPLETE 4+ VIEW ; STANDING AP KNEES  COMPARISON: Left knee July 17, 2010  FINDINGS: Standing AP knees: There is moderate narrowing medially on both sides. There is spurring medially and laterally. No erosive change. No fracture or dislocation.  Right knee: Standing frontal, lateral, tunnel, and sunrise patellar images were obtained. There is no fracture, dislocation, or effusion. There is moderate narrowing medially. There is also moderate patellofemoral joint narrowing. There is spurring along the posterior superior patella. There is a small amount of meniscal calcification laterally. No well-defined loose body seen. No erosive change.  IMPRESSION: Osteoarthritic change bilaterally. In the right knee, there is some meniscal calcification laterally which is probably secondary to the osteoarthritic change. This is a finding that also may be seen with calcium pyrophosphate deposition disease which may present clinically as pseudogout. There is no fracture or effusion.  LEFT KNEE - COMPLETE 4+ VIEW  COMPARISON: 07/17/2010  FINDINGS: Osseous demineralization. Scattered joint space narrowing and mild spur formation greatest at patellofemoral  joint. No acute fracture, dislocation or bone destruction. No knee joint effusion. Comparison AP view of the RIGHT knee demonstrates mild degenerative changes as well.  IMPRESSION: Degenerative changes LEFT knee without acute abnormality.  Assessment and Plan: Knee pain  Knee sprain - Plan: DG Knee Complete 4 Views Left, DG Knee Complete 4 Views Right  Placed in a hinged knee brace for stability.  She may use OTC medications for pain as needed.  Will refer to ortho as she may need an MRI/ scope.   See patient instructions for more details.     Signed Abbe AmsterdamJessica Copland, MD

## 2013-08-02 ENCOUNTER — Telehealth: Payer: Self-pay

## 2013-08-02 DIAGNOSIS — M25562 Pain in left knee: Secondary | ICD-10-CM

## 2013-08-02 NOTE — Telephone Encounter (Signed)
Per OV with Dr. Patsy Lageropland- referral entered.

## 2013-08-02 NOTE — Telephone Encounter (Signed)
Thanks sara for correcting this.  Gave pt a call to apologize.  LMOM

## 2013-08-02 NOTE — Telephone Encounter (Signed)
Pt states that she saw dr copland on 07/27/13 and thought she was getting a referral to [facility] please check on this as nothing is in sys

## 2013-11-06 ENCOUNTER — Ambulatory Visit: Payer: Medicare FFS | Admitting: Vascular Surgery

## 2013-11-20 ENCOUNTER — Telehealth: Payer: Self-pay

## 2013-11-20 DIAGNOSIS — Z1211 Encounter for screening for malignant neoplasm of colon: Secondary | ICD-10-CM

## 2013-11-20 NOTE — Telephone Encounter (Signed)
Patient tried to return call to clinical TL. Patient says she will be home all day now. Please call back thank you!  [phone]

## 2013-11-20 NOTE — Telephone Encounter (Signed)
Pt would like to have a referral to have a "colon test". Please call [phone]

## 2013-11-20 NOTE — Telephone Encounter (Signed)
Left message on machine to call back  

## 2013-11-20 NOTE — Telephone Encounter (Signed)
Dr. Milus GlazierLauenstein, can we refer pt for a screening colonoscopy?

## 2013-11-21 NOTE — Telephone Encounter (Signed)
Please refer to Dr. Loreta AveMann

## 2013-12-27 LAB — HM COLONOSCOPY

## 2014-01-07 ENCOUNTER — Encounter: Payer: Self-pay | Admitting: Family Medicine

## 2014-01-07 ENCOUNTER — Ambulatory Visit (INDEPENDENT_AMBULATORY_CARE_PROVIDER_SITE_OTHER): Payer: Medicare PPO | Admitting: Family Medicine

## 2014-01-07 VITALS — BP 136/68 | HR 99 | Temp 98.1°F | Resp 16 | Ht 64.25 in | Wt 184.2 lb

## 2014-01-07 DIAGNOSIS — E119 Type 2 diabetes mellitus without complications: Secondary | ICD-10-CM

## 2014-01-07 DIAGNOSIS — Z0000 Encounter for general adult medical examination without abnormal findings: Secondary | ICD-10-CM

## 2014-01-07 DIAGNOSIS — Z23 Encounter for immunization: Secondary | ICD-10-CM

## 2014-01-07 LAB — POCT GLYCOSYLATED HEMOGLOBIN (HGB A1C): Hemoglobin A1C: 6.4

## 2014-01-07 LAB — LIPID PANEL
Cholesterol: 277 mg/dL — ABNORMAL HIGH (ref 0–200)
HDL: 42 mg/dL (ref >39)
LDL Cholesterol: 192 mg/dL — ABNORMAL HIGH (ref 0–99)
Total CHOL/HDL Ratio: 6.6 Ratio
Triglycerides: 217 mg/dL — ABNORMAL HIGH (ref <150)
VLDL: 43 mg/dL — ABNORMAL HIGH (ref 0–40)

## 2014-01-07 LAB — CBC WITH DIFFERENTIAL/PLATELET
Basophils Absolute: 0.1 10*3/uL (ref 0.0–0.1)
Basophils Relative: 1 % (ref 0–1)
Eosinophils Absolute: 0.2 10*3/uL (ref 0.0–0.7)
Eosinophils Relative: 4 % (ref 0–5)
HCT: 40.3 % (ref 36.0–46.0)
Hemoglobin: 13.3 g/dL (ref 12.0–15.0)
Lymphocytes Relative: 24 % (ref 12–46)
Lymphs Abs: 1.4 10*3/uL (ref 0.7–4.0)
MCH: 25.6 pg — ABNORMAL LOW (ref 26.0–34.0)
MCHC: 33 g/dL (ref 30.0–36.0)
MCV: 77.5 fL — ABNORMAL LOW (ref 78.0–100.0)
MPV: 8.9 fL — ABNORMAL LOW (ref 9.4–12.4)
Monocytes Absolute: 0.7 10*3/uL (ref 0.1–1.0)
Monocytes Relative: 12 % (ref 3–12)
Neutro Abs: 3.4 10*3/uL (ref 1.7–7.7)
Neutrophils Relative %: 59 % (ref 43–77)
Platelets: 359 10*3/uL (ref 150–400)
RBC: 5.2 MIL/uL — ABNORMAL HIGH (ref 3.87–5.11)
RDW: 15.2 % (ref 11.5–15.5)
WBC: 5.8 10*3/uL (ref 4.0–10.5)

## 2014-01-07 LAB — COMPREHENSIVE METABOLIC PANEL
ALT: 16 U/L (ref 0–35)
AST: 16 U/L (ref 0–37)
Albumin: 3.9 g/dL (ref 3.5–5.2)
Alkaline Phosphatase: 92 U/L (ref 39–117)
BUN: 14 mg/dL (ref 6–23)
CO2: 26 mEq/L (ref 19–32)
Calcium: 9.6 mg/dL (ref 8.4–10.5)
Chloride: 105 mEq/L (ref 96–112)
Creat: 0.73 mg/dL (ref 0.50–1.10)
Glucose, Bld: 92 mg/dL (ref 70–99)
Potassium: 4.2 mEq/L (ref 3.5–5.3)
Sodium: 140 mEq/L (ref 135–145)
Total Bilirubin: 0.3 mg/dL (ref 0.2–1.2)
Total Protein: 7.3 g/dL (ref 6.0–8.3)

## 2014-01-07 LAB — MICROALBUMIN, URINE: Microalb, Ur: 1.9 mg/dL (ref <2.0)

## 2014-01-07 NOTE — Progress Notes (Signed)
   Subjective:    Patient ID: Renee Young, female    DOB: 06/05/1945, 68 y.o.   MRN: [id number]  HPI    Review of Systems  Constitutional: Negative.   HENT: Positive for trouble swallowing.   Eyes: Negative.   Respiratory: Negative.   Cardiovascular: Negative.   Gastrointestinal: Negative.   Endocrine: Negative.   Genitourinary: Negative.   Musculoskeletal: Positive for back pain, joint swelling and arthralgias.  Allergic/Immunologic: Negative.   Neurological: Negative.   Hematological: Negative.   Psychiatric/Behavioral: Positive for decreased concentration.       Objective:   Physical Exam        Assessment & Plan:

## 2014-01-07 NOTE — Patient Instructions (Signed)

## 2014-01-07 NOTE — Progress Notes (Signed)
° °  Subjective:    Patient ID: Renee Young, female    DOB: 04/08/1945, 68 y.o.   MRN: [id number] This chart was scribed for Elvina SidleKurt Lauenstein, MD by Jolene Provostobert Halas, Medical Scribe. This patient was seen in Room 25 and the patient's care was started a 8:25 AM.  Chief Complaint  Patient presents with   Annual Exam   Dysphagia    less than once a month    HPI HPI Comments: Renee Simmeringnne M Largent is a 68 y.o. female who [facility]. Pt states she is doing well. Pt endorses continued occasional right knee pain. Pt states two of her siblings and her cousin have autoimmune disorders, and is concerned she might develop one. Pt states that her varicose veins have been better after her laser vein surgery. Pt states she uses her support hose for her varicose veins when she is going to be on her feet all day. Pt states she has been exercising three times per week.    Review of Systems  Musculoskeletal: Positive for arthralgias and gait problem (Improving).  see above.     Objective:   Physical Exam  Constitutional: She is oriented to person, place, and time. She appears well-developed and well-nourished.  HENT:  Head: Normocephalic and atraumatic.  Eyes: EOM are normal. Pupils are equal, round, and reactive to light.  Neck: Neck supple.  Cardiovascular: Normal rate, regular rhythm and normal heart sounds.   Pulmonary/Chest: Effort normal and breath sounds normal. No respiratory distress. She has no wheezes.  Abdominal: Soft. She exhibits no distension and no mass. There is no tenderness. There is no rebound and no guarding.  Neurological: She is alert and oriented to person, place, and time.  Skin: Skin is warm and dry.  Psychiatric: She has a normal mood and affect. Her behavior is normal.  Nursing note and vitals reviewed.      Assessment & Plan:    This chart was scribed in my presence and reviewed by me personally.  Medicare annual wellness visit,  subsequent  Type 2 diabetes mellitus without complication - Plan: POCT glycosylated hemoglobin (Hb A1C), Microalbumin, urine, CBC with Differential, Comprehensive metabolic panel, Lipid panel  Signed, Elvina SidleKurt Lauenstein, MD

## 2014-01-08 ENCOUNTER — Encounter: Payer: Self-pay | Admitting: *Deleted

## 2014-02-04 ENCOUNTER — Ambulatory Visit (INDEPENDENT_AMBULATORY_CARE_PROVIDER_SITE_OTHER): Payer: Medicare PPO | Admitting: Family Medicine

## 2014-02-04 VITALS — BP 128/62 | HR 96 | Temp 98.0°F | Resp 18 | Ht 64.5 in | Wt 181.2 lb

## 2014-02-04 DIAGNOSIS — R350 Frequency of micturition: Secondary | ICD-10-CM

## 2014-02-04 DIAGNOSIS — N390 Urinary tract infection, site not specified: Secondary | ICD-10-CM

## 2014-02-04 DIAGNOSIS — A499 Bacterial infection, unspecified: Secondary | ICD-10-CM

## 2014-02-04 DIAGNOSIS — N10 Acute tubulo-interstitial nephritis: Secondary | ICD-10-CM

## 2014-02-04 LAB — POCT URINALYSIS DIPSTICK
BILIRUBIN UA: NEGATIVE
GLUCOSE UA: NEGATIVE
KETONES UA: NEGATIVE
Nitrite, UA: POSITIVE
PROTEIN UA: 100
SPEC GRAV UA: 1.02
Urobilinogen, UA: 0.2
pH, UA: 5.5

## 2014-02-04 LAB — POCT UA - MICROSCOPIC ONLY
CRYSTALS, UR, HPF, POC: NEGATIVE
Casts, Ur, LPF, POC: NEGATIVE
Mucus, UA: NEGATIVE
Yeast, UA: NEGATIVE

## 2014-02-04 MED ORDER — CIPROFLOXACIN HCL 500 MG PO TABS: 500.0000 mg | ORAL_TABLET | Freq: Two times a day (BID) | ORAL | Status: DC

## 2014-02-04 NOTE — Patient Instructions (Signed)
Pyelonephritis, Adult °Pyelonephritis is a kidney infection. In general, there are 2 main types of pyelonephritis: °· Infections that come on quickly without any warning (acute pyelonephritis). °· Infections that persist for a long period of time (chronic pyelonephritis). °CAUSES  °Two main causes of pyelonephritis are: °· Bacteria traveling from the bladder to the kidney. This is a problem especially in pregnant women. The urine in the bladder can become filled with bacteria from multiple causes, including: °¨ Inflammation of the prostate gland (prostatitis). °¨ Sexual intercourse in females. °¨ Bladder infection (cystitis). °· Bacteria traveling from the bloodstream to the tissue part of the kidney. °Problems that may increase your risk of getting a kidney infection include: °· Diabetes. °· Kidney stones or bladder stones. °· Cancer. °· Catheters placed in the bladder. °· Other abnormalities of the kidney or ureter. °SYMPTOMS  °· Abdominal pain. °· Pain in the side or flank area. °· Fever. °· Chills. °· Upset stomach. °· Blood in the urine (dark urine). °· Frequent urination. °· Strong or persistent urge to urinate. °· Burning or stinging when urinating. °DIAGNOSIS  °Your caregiver may diagnose your kidney infection based on your symptoms. A urine sample may also be taken. °TREATMENT  °In general, treatment depends on how severe the infection is.  °· If the infection is mild and caught early, your caregiver may treat you with oral antibiotics and send you home. °· If the infection is more severe, the bacteria may have gotten into the bloodstream. This will require intravenous (IV) antibiotics and a hospital stay. Symptoms may include: °¨ High fever. °¨ Severe flank pain. °¨ Shaking chills. °· Even after a hospital stay, your caregiver may require you to be on oral antibiotics for a period of time. °· Other treatments may be required depending upon the cause of the infection. °HOME CARE INSTRUCTIONS  °· Take your  antibiotics as directed. Finish them even if you start to feel better. °· Make an appointment to have your urine checked to make sure the infection is gone. °· Drink enough fluids to keep your urine clear or pale yellow. °· Take medicines for the bladder if you have urgency and frequency of urination as directed by your caregiver. °SEEK IMMEDIATE MEDICAL CARE IF:  °· You have a fever or persistent symptoms for more than 2-3 days. °· You have a fever and your symptoms suddenly get worse. °· You are unable to take your antibiotics or fluids. °· You develop shaking chills. °· You experience extreme weakness or fainting. °· There is no improvement after 2 days of treatment. °MAKE SURE YOU: °· Understand these instructions. °· Will watch your condition. °· Will get help right away if you are not doing well or get worse. °Document Released: 02/01/2005 Document Revised: 08/03/2011 Document Reviewed: 07/08/2010 °ExitCare® Patient Information ©2015 [employer]. This information is not intended to replace advice given to you by your health care provider. Make sure you discuss any questions you have with your health care provider. ° °

## 2014-02-04 NOTE — Progress Notes (Signed)
Chief Complaint:  Chief Complaint  Patient presents with  . Urinary Frequency    sxs started last night  . Back Pain  . Fever  . Hematuria    HPI: Renee Young is a 68 y.o. female who is here for  Increase urinary sxs, chills and also dysuria, subjective fevers, back pain She has hand swelling and rash with PCN She has not drank enough water.  Past Medical History  Diagnosis Date  . Allergy   . Anemia   . Arthritis   . Depression   . Diabetes mellitus without complication   . UTI (urinary tract infection)   . Kidney stones   . ADD (attention deficit disorder)    Past Surgical History  Procedure Laterality Date  . Cesarean section    . Tubal ligation    . Ureterocele incision     History   Social History  . Marital Status: Married    Spouse Name: N/A    Number of Children: N/A  . Years of Education: N/A   Social History Main Topics  . Smoking status: Never Smoker   . Smokeless tobacco: Never Used  . Alcohol Use: 1.2 oz/week    2 Glasses of wine per week  . Drug Use: No  . Sexual Activity: Yes   Other Topics Concern  . None   Social History Narrative   Married   Retired Financial traderTeacher   Exercises      Family History  Problem Relation Age of Onset  . Cancer Mother   . Kidney disease Mother   . Diabetes Father   . Heart disease Father   . Hyperlipidemia Father   . Hypertension Father   . Stroke Father   . Arthritis Sister   . Obesity Brother   . Arthritis Brother   . Heart disease Maternal Grandmother   . Stroke Maternal Grandmother   . Hypertension Maternal Grandmother   . Heart disease Maternal Grandfather   . Hyperlipidemia Maternal Grandfather   . Stroke Paternal Grandmother   . Heart disease Paternal Grandmother   . Heart disease Paternal Grandfather   . Heart attack Paternal Grandfather   . Arthritis Daughter    Allergies  Allergen Reactions  . Nitrofurantoin   . Penicillins    Prior to Admission medications   Medication Sig  Start Date End Date Taking? Authorizing Provider  amphetamine-dextroamphetamine (ADDERALL) 30 MG tablet Take 30 mg by mouth 2 (two) times daily.   Yes Historical Provider, MD  FLUoxetine (PROZAC) 20 MG capsule Take 20 mg by mouth 3 (three) times daily.   Yes Historical Provider, MD     ROS: The patient denies  night sweats, unintentional weight loss, chest pain, palpitations, wheezing, dyspnea on exertion, nausea, vomiting, abdominal pain, melena, numbness, weakness, or tingling.   All other systems have been reviewed and were otherwise negative with the exception of those mentioned in the HPI and as above.    PHYSICAL EXAM: Filed Vitals:   02/04/14 1346  BP: 128/62  Pulse: 96  Temp: 98 F (36.7 C)  Resp: 18   Filed Vitals:   02/04/14 1346  Height: 5' 4.5" (1.638 m)  Weight: 181 lb 3.2 oz (82.192 kg)   Body mass index is 30.63 kg/(m^2).  General: Alert, no acute distress HEENT:  Normocephalic, atraumatic, oropharynx patent. EOMI, PERRLA Cardiovascular:  Regular rate and rhythm, no rubs murmurs or gallops.  No Carotid bruits, radial pulse intact. No pedal edema.  Respiratory:  Clear to auscultation bilaterally.  No wheezes, rales, or rhonchi.  No cyanosis, no use of accessory musculature GI: No organomegaly, abdomen is soft and non-tender, positive bowel sounds.  No masses. Skin: No rashes. Neurologic: Facial musculature symmetric. Psychiatric: Patient is appropriate throughout our interaction. Lymphatic: No cervical lymphadenopathy Musculoskeletal: Gait intact. + CVA tenderenss   LABS: Results for orders placed or performed in visit on 02/04/14  POCT UA - Microscopic Only  Result Value Ref Range   WBC, Ur, HPF, POC 10-20    RBC, urine, microscopic 25-30    Bacteria, U Microscopic 1+    Mucus, UA NEG    Epithelial cells, urine per micros 0-2    Crystals, Ur, HPF, POC NEG    Casts, Ur, LPF, POC NEG    Yeast, UA NEG   POCT urinalysis dipstick  Result Value Ref Range    Color, UA YELLOW    Clarity, UA CLOUDY    Glucose, UA NEG    Bilirubin, UA NEG    Ketones, UA NEG    Spec Grav, UA 1.020    Blood, UA LARGE    pH, UA 5.5    Protein, UA 100    Urobilinogen, UA 0.2    Nitrite, UA POSITIVE    Leukocytes, UA large (3+)      EKG/XRAY:   Primary [facility]   ASSESSMENT/PLAN: Encounter Diagnoses  Name Primary?  . Frequent urination   . Acute pyelonephritis Yes  . UTI (urinary tract infection), bacterial    Rx cipro 500 mg BID x 10 days, urine cx pending She will push fluids at home She dec,ine IVF Advise to call me if she has tendon issues.   Gross sideeffects, risk and benefits, and alternatives of medications d/w patient. Patient is aware that all medications have potential sideeffects and we are unable to predict every sideeffect or drug-drug interaction that may occur.  Hamilton CapriLE, Johndaniel Catlin PHUONG, DO 02/04/2014 2:35 PM

## 2014-02-07 LAB — URINE CULTURE: Colony Count: >=100000

## 2014-02-09 ENCOUNTER — Ambulatory Visit (INDEPENDENT_AMBULATORY_CARE_PROVIDER_SITE_OTHER): Payer: Medicare PPO | Admitting: Family Medicine

## 2014-02-09 ENCOUNTER — Ambulatory Visit (INDEPENDENT_AMBULATORY_CARE_PROVIDER_SITE_OTHER): Payer: Medicare PPO

## 2014-02-09 VITALS — BP 125/70 | HR 87 | Temp 98.3°F | Resp 16 | Ht 64.3 in | Wt 182.4 lb

## 2014-02-09 DIAGNOSIS — N390 Urinary tract infection, site not specified: Secondary | ICD-10-CM

## 2014-02-09 DIAGNOSIS — M545 Low back pain, unspecified: Secondary | ICD-10-CM

## 2014-02-09 DIAGNOSIS — Q6231 Congenital ureterocele, orthotopic: Secondary | ICD-10-CM

## 2014-02-09 DIAGNOSIS — A499 Bacterial infection, unspecified: Secondary | ICD-10-CM

## 2014-02-09 LAB — POCT URINALYSIS DIPSTICK
Bilirubin, UA: NEGATIVE
Glucose, UA: NEGATIVE
Ketones, UA: NEGATIVE
Nitrite, UA: NEGATIVE
Protein, UA: NEGATIVE
Spec Grav, UA: 1.015
Urobilinogen, UA: 0.2
pH, UA: 5.5

## 2014-02-09 LAB — COMPLETE METABOLIC PANEL WITH GFR
ALT: 18 U/L (ref 0–35)
AST: 17 U/L (ref 0–37)
Albumin: 3.7 g/dL (ref 3.5–5.2)
Alkaline Phosphatase: 92 U/L (ref 39–117)
Creat: 0.97 mg/dL (ref 0.50–1.10)
GFR, Est Non African American: 61 mL/min
Glucose, Bld: 108 mg/dL — ABNORMAL HIGH (ref 70–99)
Potassium: 3.9 mEq/L (ref 3.5–5.3)
Sodium: 137 mEq/L (ref 135–145)
Total Bilirubin: 0.2 mg/dL (ref 0.2–1.2)
Total Protein: 7 g/dL (ref 6.0–8.3)

## 2014-02-09 LAB — POCT UA - MICROSCOPIC ONLY
Casts, Ur, LPF, POC: NEGATIVE
Crystals, Ur, HPF, POC: NEGATIVE
Mucus, UA: NEGATIVE
Yeast, UA: NEGATIVE

## 2014-02-09 LAB — COMPLETE METABOLIC PANEL WITHOUT GFR
BUN: 16 mg/dL (ref 6–23)
CO2: 25 meq/L (ref 19–32)
Calcium: 9.1 mg/dL (ref 8.4–10.5)
Chloride: 102 meq/L (ref 96–112)
GFR, Est African American: 70 mL/min

## 2014-02-09 LAB — POCT CBC
Granulocyte percent: 66.4 %G (ref 37–80)
HCT, POC: 41.6 % (ref 37.7–47.9)
Hemoglobin: 13 g/dL (ref 12.2–16.2)
Lymph, poc: 1.6 (ref 0.6–3.4)
MCH, POC: 24.8 pg — AB (ref 27–31.2)
MCHC: 31.4 g/dL — AB (ref 31.8–35.4)
MCV: 79 fL — AB (ref 80–97)
MID (cbc): 0.4 (ref 0–0.9)
MPV: 6.5 fL (ref 0–99.8)
POC Granulocyte: 4 (ref 2–6.9)
POC LYMPH PERCENT: 26.6 % (ref 10–50)
POC MID %: 7 %M (ref 0–12)
Platelet Count, POC: 346 10*3/uL (ref 142–424)
RBC: 5.26 M/uL (ref 4.04–5.48)
RDW, POC: 15.5 %
WBC: 6 10*3/uL (ref 4.6–10.2)

## 2014-02-09 NOTE — Progress Notes (Signed)
Chief Complaint:  Chief Complaint  Patient presents with  . Follow-up  . Urinary Tract Infection    HPI: Renee Young is a 68 y.o. female who is here for  Recheck UTI Has clinching feeling but no dysuria She has not had fevers since starting abx after 1st day, was wondering why seh is recovering so slowly E coli is sensitive to cipro HAs a hx of ureterocele She has been on cipro , no tendon problems with the meds   Past Medical History  Diagnosis Date  . Allergy   . Anemia   . Arthritis   . Depression   . Diabetes mellitus without complication   . UTI (urinary tract infection)   . Kidney stones   . ADD (attention deficit disorder)    Past Surgical History  Procedure Laterality Date  . Cesarean section    . Tubal ligation    . Ureterocele incision     History   Social History  . Marital Status: Married    Spouse Name: N/A    Number of Children: N/A  . Years of Education: N/A   Social History Main Topics  . Smoking status: Never Smoker   . Smokeless tobacco: Never Used  . Alcohol Use: 1.2 oz/week    2 Glasses of wine per week  . Drug Use: No  . Sexual Activity: Yes   Other Topics Concern  . None   Social History Narrative   Married   Retired Financial traderTeacher   Exercises      Family History  Problem Relation Age of Onset  . Cancer Mother   . Kidney disease Mother   . Diabetes Father   . Heart disease Father   . Hyperlipidemia Father   . Hypertension Father   . Stroke Father   . Arthritis Sister   . Obesity Brother   . Arthritis Brother   . Heart disease Maternal Grandmother   . Stroke Maternal Grandmother   . Hypertension Maternal Grandmother   . Heart disease Maternal Grandfather   . Hyperlipidemia Maternal Grandfather   . Stroke Paternal Grandmother   . Heart disease Paternal Grandmother   . Heart disease Paternal Grandfather   . Heart attack Paternal Grandfather   . Arthritis Daughter    Allergies  Allergen Reactions  .  Nitrofurantoin   . Penicillins    Prior to Admission medications   Medication Sig Start Date End Date Taking? Authorizing Provider  amphetamine-dextroamphetamine (ADDERALL) 30 MG tablet Take 30 mg by mouth 2 (two) times daily.   Yes Historical Provider, MD  ciprofloxacin (CIPRO) 500 MG tablet Take 1 tablet (500 mg total) by mouth 2 (two) times daily. 02/04/14  Yes Kaylyne Axton P Finleigh Cheong, DO  FLUoxetine (PROZAC) 20 MG capsule Take 20 mg by mouth 3 (three) times daily.   Yes Historical Provider, MD     ROS: The patient denies fevers, chills, night sweats, unintentional weight loss, chest pain, palpitations, wheezing, dyspnea on exertion, nausea, vomiting, abdominal pain, hematuria, melena, numbness, weakness, or tingling.  All other systems have been reviewed and were otherwise negative with the exception of those mentioned in the HPI and as above.    PHYSICAL EXAM: Filed Vitals:   02/09/14 1041  BP: 125/70  Pulse: 87  Temp: 98.3 F (36.8 C)  Resp: 16   Filed Vitals:   02/09/14 1041  Height: 5' 4.3" (1.633 m)  Weight: 182 lb 6.4 oz (82.736 kg)   Body mass index is  31.03 kg/(m^2).  General: Alert, no acute distress HEENT:  Normocephalic, atraumatic, oropharynx patent. EOMI, PERRLA Cardiovascular:  Regular rate and rhythm, no rubs murmurs or gallops.  No Carotid bruits, radial pulse intact. No pedal edema.  Respiratory: Clear to auscultation bilaterally.  No wheezes, rales, or rhonchi.  No cyanosis, no use of accessory musculature GI: No organomegaly, abdomen is soft and non-tender, positive bowel sounds.  No masses. Skin: No rashes. Neurologic: Facial musculature symmetric. Psychiatric: Patient is appropriate throughout our interaction. Lymphatic: No cervical lymphadenopathy Musculoskeletal: Gait intact. + CVA tenderness bialterally   LABS: Results for orders placed or performed in visit on 02/09/14  POCT urinalysis dipstick  Result Value Ref Range   Color, UA yellow    Clarity, UA  clear    Glucose, UA neg    Bilirubin, UA neg    Ketones, UA neg    Spec Grav, UA 1.015    Blood, UA large    pH, UA 5.5    Protein, UA neg    Urobilinogen, UA 0.2    Nitrite, UA neg    Leukocytes, UA Trace   POCT UA - Microscopic Only  Result Value Ref Range   WBC, Ur, HPF, POC 3-6    RBC, urine, microscopic 5-10    Bacteria, U Microscopic trace    Mucus, UA neg    Epithelial cells, urine per micros 2-4    Crystals, Ur, HPF, POC neg    Casts, Ur, LPF, POC neg    Yeast, UA neg   POCT CBC  Result Value Ref Range   WBC 6.0 4.6 - 10.2 K/uL   Lymph, poc 1.6 0.6 - 3.4   POC LYMPH PERCENT 26.6 10 - 50 %L   MID (cbc) 0.4 0 - 0.9   POC MID % 7.0 0 - 12 %M   POC Granulocyte 4.0 2 - 6.9   Granulocyte percent 66.4 37 - 80 %G   RBC 5.26 4.04 - 5.48 M/uL   Hemoglobin 13.0 12.2 - 16.2 g/dL   HCT, POC 16.141.6 09.637.7 - 47.9 %   MCV 79.0 (A) 80 - 97 fL   MCH, POC 24.8 (A) 27 - 31.2 pg   MCHC 31.4 (A) 31.8 - 35.4 g/dL   RDW, POC 04.515.5 %   Platelet Count, POC 346 142 - 424 K/uL   MPV 6.5 0 - 99.8 fL     EKG/XRAY:   Primary read [facility]. + moderate stool  burden No obvious e/o renal stone or obstruction Scoliosis   ASSESSMENT/PLAN: Encounter Diagnoses  Name Primary?  . UTI (urinary tract infection), bacterial Yes  . Bilateral low back pain without sciatica   . Congenital ureterocele    Push fluids Miralax prn  Cont with cipro F.u prn, will get future order for UA and urine cx  To make sure she no longer has UTI, labs only.   Gross sideeffects, risk and benefits, and alternatives of medications d/w patient. Patient is aware that all medications have potential sideeffects and we are unable to predict every sideeffect or drug-drug interaction that may occur.  Jalayah Gutridge PHUONG, DO 02/09/2014 11:16 AM

## 2014-02-12 ENCOUNTER — Telehealth: Payer: Self-pay

## 2014-02-12 NOTE — Telephone Encounter (Signed)
Spoke to patient about labs and official xray results.

## 2014-02-15 ENCOUNTER — Encounter: Payer: Self-pay | Admitting: Radiology

## 2014-02-18 ENCOUNTER — Other Ambulatory Visit (INDEPENDENT_AMBULATORY_CARE_PROVIDER_SITE_OTHER): Payer: Medicare PPO | Admitting: Radiology

## 2014-02-18 DIAGNOSIS — A499 Bacterial infection, unspecified: Secondary | ICD-10-CM

## 2014-02-18 DIAGNOSIS — Q6231 Congenital ureterocele, orthotopic: Secondary | ICD-10-CM

## 2014-02-18 DIAGNOSIS — M545 Low back pain, unspecified: Secondary | ICD-10-CM

## 2014-02-18 DIAGNOSIS — N390 Urinary tract infection, site not specified: Secondary | ICD-10-CM

## 2014-02-18 LAB — POCT UA - MICROSCOPIC ONLY
Bacteria, U Microscopic: NEGATIVE
Casts, Ur, LPF, POC: NEGATIVE
Crystals, Ur, HPF, POC: NEGATIVE
Epithelial cells, urine per micros: NEGATIVE
Mucus, UA: NEGATIVE
Yeast, UA: NEGATIVE

## 2014-02-18 LAB — POCT URINALYSIS DIPSTICK
Bilirubin, UA: NEGATIVE
Glucose, UA: NEGATIVE
Ketones, UA: NEGATIVE
Nitrite, UA: NEGATIVE
Protein, UA: NEGATIVE
Spec Grav, UA: 1.015
Urobilinogen, UA: 0.2
pH, UA: 5.5

## 2014-02-20 LAB — URINE CULTURE
Colony Count: NO GROWTH
Organism ID, Bacteria: NO GROWTH

## 2014-02-26 ENCOUNTER — Telehealth: Payer: Self-pay | Admitting: *Deleted

## 2014-02-26 NOTE — Telephone Encounter (Signed)
[facility], stating that patient has not been seen since 11/28/2013 and informed them she "was no longer diabetic).  Health maintenance updated and OD added to care team.

## 2014-02-26 NOTE — Telephone Encounter (Signed)
Phoned and spoke with patient to find out who opthamologist and last DEE.  11/28/2013-[facility] [place] [phone])  [facility]oned [facility] and spoke with Marchelle Folksmanda and she is faxing me a copy of patient's 11/2013 DEE.

## 2014-05-16 ENCOUNTER — Encounter: Payer: Self-pay | Admitting: Family Medicine

## 2014-05-16 ENCOUNTER — Ambulatory Visit (INDEPENDENT_AMBULATORY_CARE_PROVIDER_SITE_OTHER): Payer: Medicare PPO | Admitting: Family Medicine

## 2014-05-16 VITALS — BP 138/77 | HR 106 | Temp 98.3°F | Resp 16 | Ht 63.5 in | Wt 178.8 lb

## 2014-05-16 DIAGNOSIS — R21 Rash and other nonspecific skin eruption: Secondary | ICD-10-CM

## 2014-05-16 DIAGNOSIS — M25562 Pain in left knee: Secondary | ICD-10-CM | POA: Diagnosis not present

## 2014-05-16 DIAGNOSIS — G8929 Other chronic pain: Secondary | ICD-10-CM | POA: Diagnosis not present

## 2014-05-16 MED ORDER — PREDNISONE 20 MG PO TABS: 40.0000 mg | ORAL_TABLET | Freq: Every day | ORAL | Status: DC

## 2014-05-16 MED ORDER — CLOTRIMAZOLE-BETAMETHASONE 1-0.05 % EX CREA: 1.0000 "application " | TOPICAL_CREAM | Freq: Two times a day (BID) | CUTANEOUS | Status: DC

## 2014-05-16 NOTE — Patient Instructions (Signed)
I am suspicious that you have psoriatic arthritis given the rash below the right knee, the pitting of the fingernails, and the diffuse nature of your joint pains  I am referring you to Roanna EpleyBert Fields, MD and giving you some prednisone in the meantime to stop the inflammation Also, we are prescribing cream to heal up the scar of your right lateral knee

## 2014-05-16 NOTE — Progress Notes (Signed)
Patient ID: Renee Young, female   DOB: 12/13/1945, 69 y.o.   MRN: [id number]  This chart was scribed for Elvina SidleKurt Lauenstein, MD by Charline BillsEssence Howell, ED Scribe. The patient was seen in room 27. Patient's care was started at 1:40 PM.  Patient ID: Renee Young MRN: [id number], DOB: 05/11/1945, 69 y.o. Date of Encounter: 05/16/2014, 1:40 PM  Primary Physician: Elvina SidleLAUENSTEIN,KURT, MD  Chief Complaint  Patient presents with   Arthritis    hands are bothering her   Knee Pain    right knee is waking her up at night    Scar    scar on rigfht leg from childhood has started opening up again    HPI: 69 y.o. year old female with history below presents with gradually improving bilateral inner knee pain, R worse than L, for the past month. She describes pain as a sharp sensation that wakes her at night. Pt reports h/o knee pain. She tried hyaluronic acid in August 2015 with temporary relief. She is not interested in surgery.   Arthritis  Pt reports gradually worsening bilateral hand pain for the past few weeks. She reports redness around her cuticle for many years.   Scar Pt has a childhood scar on her R leg that has recently opened. Her scar was initially obtained when she fell on a piece of glass while walking up a hill at age 89. Pt states that this area is very sensitive. She further reports that the scar opens and scabs again every few months.   Past Medical History  Diagnosis Date   Allergy    Anemia    Arthritis    Depression    Diabetes mellitus without complication    UTI (urinary tract infection)    Kidney stones    ADD (attention deficit disorder)      Home Meds: Prior to Admission medications   Medication Sig Start Date End Date Taking? Authorizing Provider  amphetamine-dextroamphetamine (ADDERALL) 30 MG tablet Take 30 mg by mouth 2 (two) times daily.    Historical Provider, MD  ciprofloxacin (CIPRO) 500 MG tablet Take 1 tablet (500 mg total) by mouth 2 (two) times daily.  02/04/14   Thao P Le, DO  FLUoxetine (PROZAC) 20 MG capsule Take 20 mg by mouth 3 (three) times daily.    Historical Provider, MD    Allergies:  Allergies  Allergen Reactions   Nitrofurantoin    Penicillins     History   Social History   Marital Status: Married    Spouse Name: N/A   Number of Children: N/A   Years of Education: N/A   Occupational History   Not on file.   Social History Main Topics   Smoking status: Never Smoker    Smokeless tobacco: Never Used   Alcohol Use: 1.2 oz/week    2 Glasses of wine per week   Drug Use: No   Sexual Activity: Yes   Other Topics Concern   Not on file   Social History Narrative   Married   Retired Financial traderTeacher   Exercises        Review of Systems: Constitutional: negative for chills, fever, night sweats, weight changes, or fatigue  HEENT: negative for vision changes, hearing loss, congestion, rhinorrhea, ST, epistaxis, or sinus pressure Cardiovascular: negative for chest pain or palpitations Respiratory: negative for hemoptysis, wheezing, shortness of breath, or cough Abdominal: negative for abdominal pain, nausea, vomiting, diarrhea, or constipation Msk: + arthralgias  Dermatological: negative for rash, + wound Neurologic:  negative for headache, dizziness, or syncope All other systems reviewed and are otherwise negative with the exception to those above and in the HPI.  Physical Exam: Blood pressure 138/77, pulse 106, temperature 98.3 F (36.8 C), temperature source Oral, resp. rate 16, height 5' 3.5" (1.613 m), weight 178 lb 12.8 oz (81.103 kg), SpO2 96 %., Body mass index is 31.17 kg/(m^2). General: Well developed, well nourished, in no acute distress. Head: Normocephalic, atraumatic, eyes without discharge, sclera non-icteric, nares are without discharge. Bilateral auditory canals clear, TM's are without perforation, pearly grey and translucent with reflective cone of light bilaterally. Oral cavity moist,  posterior pharynx without exudate, erythema, peritonsillar abscess, or post nasal drip.  Neck: Supple. No thyromegaly. Full ROM. No lymphadenopathy. Lungs: Clear bilaterally to auscultation without wheezes, rales, or rhonchi. Breathing is unlabored. Heart: RRR with S1 S2. No murmurs, rubs, or gallops appreciated. Abdomen: Soft, non-tender, non-distended with normoactive bowel sounds. No hepatomegaly. No rebound/guarding. No obvious abdominal masses. Msk:  Strength and tone normal for age. Diffuse PIP and DIP joint swelling of fingers. Diffuse synovial tenderness. Tenderness below the R medial tibial plateau. Extremities/Skin: Warm and dry. No clubbing or cyanosis. Scaly 3 in rash below lateral tibial plateau. Pitting in fingernails with erythema at the cuticle.  Neuro: Alert and oriented X 3. Moves all extremities spontaneously. Gait is normal. CNII-XII grossly in tact. Psych:  Responds to questions appropriately with a normal affect.   Labs:  ASSESSMENT AND PLAN:  69 y.o. year old female with  1. Knee pain, chronic, left   2. Rash and nonspecific skin eruption     This chart was scribed in my presence and reviewed by me personally.    ICD-9-CM ICD-10-CM   1. Knee pain, chronic, left 719.46 M25.562 predniSONE (DELTASONE) 20 MG tablet   338.29 G89.29 Ambulatory referral to Sports Medicine  2. Rash and nonspecific skin eruption 782.1 R21 clotrimazole-betamethasone (LOTRISONE) cream   I am suspicious that patient may have psoriatic arthritis.  Signed, [name], MD   Signed, [name], MD 05/16/2014 1:40 PM

## 2014-05-29 LAB — HM DIABETES EYE EXAM

## 2014-06-25 ENCOUNTER — Ambulatory Visit (INDEPENDENT_AMBULATORY_CARE_PROVIDER_SITE_OTHER): Payer: Medicare PPO | Admitting: Sports Medicine

## 2014-06-25 ENCOUNTER — Encounter: Payer: Self-pay | Admitting: Sports Medicine

## 2014-06-25 VITALS — BP 132/50 | HR 112 | Ht 64.0 in | Wt 180.0 lb

## 2014-06-25 DIAGNOSIS — M170 Bilateral primary osteoarthritis of knee: Secondary | ICD-10-CM | POA: Diagnosis not present

## 2014-06-25 DIAGNOSIS — R269 Unspecified abnormalities of gait and mobility: Secondary | ICD-10-CM

## 2014-06-25 MED ORDER — TRAMADOL HCL 50 MG PO TABS: 50.0000 mg | ORAL_TABLET | Freq: Two times a day (BID) | ORAL | Status: DC | PRN

## 2014-06-26 DIAGNOSIS — R269 Unspecified abnormalities of gait and mobility: Secondary | ICD-10-CM | POA: Insufficient documentation

## 2014-06-26 NOTE — Progress Notes (Signed)
  Renee Young - 69 y.o. female MRN [id number]  Date of birth: 11/27/1945  SUBJECTIVE:  Including CC & ROS.  Patient is a 69 yo C female present with bilateral chronic knee arthritis pain for about 10 years. She has intermittent flare use every few months. She has been treated previously by one the orthopedic groups in town with injection therapy increasing cortisone and visco-supplementation injection every 6 months or so that have been helpful. She was referred to this office for evaluation and consideration for orthotics as an additional option to help improve her knee pain. She has don't PT and aquatic exercise in the past that has been helpful but in general the patient is not very active and would like to do more exercise. She denies any mechanical symptoms of catching, locking, or giving way. Patient is work in the morning, getting up from a sitting, and going up stairs. She has also tried to avoid NSAIDS for pain control because of GI issues in the past but no ulcers.    ROS: Review of systems otherwise negative except for information present in HPI  HISTORY: Past Medical, Surgical, Social, and Family History Reviewed & Updated per EMR. Pertinent Historical Findings include: ADHD, balance issues with frequent falls, DM diet controlled, previous surgery of c-sections Arthritis in family with a brother who had multiple joints replaced.   DATA REVIEWED: Previous xray 2015 4 view: showed bilateral tricompartment degenerative changes, bone spurring, sclerosis of the bone and meniscus calcifications.   PHYSICAL EXAM:  VS: BP:(!) 132/50 mmHg  HR:(!) 112bpm  TEMP: ( )  RESP:   HT:5\' 4"  (162.6 cm)   WT:180 lb (81.647 kg)  BMI:31 KNEE EXAM:  General: well nourished, no acute distress Skin of LE: warm; dry, no rashes, lesions, ecchymosis or erythema. Vascular: Dorsal pedal pulses 2+ bilaterally Neurologically: Sensation to light touch lower extremities equal and intact   Obvious bony  abnormalities, 1+ effusion Palpation: no warmth, bilateral joint line tenderness 2+ retopatellar grinding Range of motion: ROM limited on the left 0-110, right 0-160 Ligaments with solid consistent endpoints including ACL, PCL, LCL, MCL. Meniscal evaluation: equivical McMurray's test, some pain thessaly's test, mildly antalgic gait. Hamstring and quadriceps strength is normal.  ASSESSMENT & PLAN: See problem based charting & AVS for pt instructions. Impression: Bilateral primary DJD of the knees Gait abnormalities  Recommendations: - place patient in knee sleeve for support and compression to wear with activities - Will f/u in 1-2 week for custom orthotics - Provided patient with HEP to with 4 direction hip strengthening - Encouraged patient to engage in regular biking 20-30 minutes 3-4 days a week to strengthen quads and CV health

## 2014-08-06 ENCOUNTER — Encounter: Payer: Self-pay | Admitting: Sports Medicine

## 2014-08-06 ENCOUNTER — Ambulatory Visit (INDEPENDENT_AMBULATORY_CARE_PROVIDER_SITE_OTHER): Payer: Medicare PPO | Admitting: Sports Medicine

## 2014-08-06 VITALS — BP 147/55 | HR 86

## 2014-08-06 DIAGNOSIS — M170 Bilateral primary osteoarthritis of knee: Secondary | ICD-10-CM | POA: Diagnosis not present

## 2014-08-06 DIAGNOSIS — R269 Unspecified abnormalities of gait and mobility: Secondary | ICD-10-CM | POA: Diagnosis not present

## 2014-08-06 NOTE — Progress Notes (Signed)
Patient ID: Renee Young, female   DOB: 13-Mar-1945, 69 y.o.   MRN: [id number]  Patient with osteoarthritis of both knees Walking is quite painful after a while She plans to walking when she travels to [place] We evaluated her and found that she had significant genu valgus This was associated with pronation of the foot and loss of the longitudinal arch  Because of this gait abnormality with felt she may benefit from orthotics to lessen her knee pain  Examination No acute distress BP 147/55 mmHg  Pulse 86  She has pronation of both feet and some chronic breakdown as noted on last exam Bilateral knees shows changes of degenerative joint disease Walking gait reveals dynamic genu valgus She crosses the midline with her medial knee bilaterally  Impression Abnormality of gait  This likely aggravates the pain in her knees  Bilateral osteoarthritis of the knees  Plan Patient was fitted for a : standard, cushioned, semi-rigid orthotic. The orthotic was heated and afterward the patient stood on the orthotic blank positioned on the orthotic stand. The patient was positioned in subtalar neutral position and 10 degrees of ankle dorsiflexion in a weight bearing stance. After completion of molding, a stable base was applied to the orthotic blank. The blank was ground to a stable position for weight bearing. Size: blue leather size 6 Base: heel wedges and blue foam rubber Posting: none Additional orthotic padding: none  Preparation time for the orthotics and the evaluation was 40 minutes face-to-face  At completion of the orthotics she had much less pronation She had good control of the genu valgus to -- only mild  She felt pain relief with these corrections  She will test is over the next month and we will make corrections as needed

## 2014-08-06 NOTE — Assessment & Plan Note (Signed)
Continue conservative care  See if you Get less knee pain by use of orthotics

## 2014-08-06 NOTE — Assessment & Plan Note (Signed)
See documentation  We thank custom orthotics in all shoes will help her gait and lessen her knee problems

## 2014-09-16 ENCOUNTER — Other Ambulatory Visit: Payer: Self-pay | Admitting: *Deleted

## 2014-09-16 ENCOUNTER — Telehealth: Payer: Self-pay | Admitting: *Deleted

## 2014-09-16 NOTE — Telephone Encounter (Signed)
Pt has stopped tramadol and has found better relief with aleve and would rather continue that therapy

## 2014-10-03 ENCOUNTER — Ambulatory Visit (INDEPENDENT_AMBULATORY_CARE_PROVIDER_SITE_OTHER): Payer: Medicare PPO | Admitting: Sports Medicine

## 2014-10-03 ENCOUNTER — Encounter: Payer: Self-pay | Admitting: Sports Medicine

## 2014-10-03 ENCOUNTER — Encounter (INDEPENDENT_AMBULATORY_CARE_PROVIDER_SITE_OTHER): Payer: Self-pay

## 2014-10-03 VITALS — BP 123/58 | HR 89 | Temp 98.5°F

## 2014-10-03 DIAGNOSIS — M170 Bilateral primary osteoarthritis of knee: Secondary | ICD-10-CM | POA: Diagnosis not present

## 2014-10-03 NOTE — Progress Notes (Signed)
   Subjective:    Patient ID: Renee Young, female    DOB: 03-May-1945, 69 y.o.   MRN: [id number]  HPI chief complaint: Bilateral knee pain  Very pleasant 69 year old female comes in today requesting bilateral knee injections. She has a documented history of bilateral knee DJD. She saw Dr.Fields back in June and had a pair of custom orthotics constructed. These have helped her knee pain but she has a trip overseas planned in 10 days and would like to go ahead with cortisone injections so that she may enjoy her trip. She has had cortisone injections in the past. It has been 3 or 4 years since her last injections. She also had a round of Visco supplementation last year at [facility] but this was only minimally helpful. Her pain is primarily along the anterior medial knee. Worse with activity. Occasional swelling. No mechanical symptoms. No recent trauma. No prior knee surgeries.    Review of Systems     Objective:   Physical Exam Well-developed, well-nourished. No acute distress. Awake alert and oriented 3. Vital signs reviewed  Examination of each knee shows full range of motion. 2+ patellofemoral crepitus bilaterally. She is tender to palpation along the medial joint lines bilaterally with pain but no popping with McMurray's. No tenderness along the lateral joint line. Good ligamentous stability. No effusion. Neurovascularly intact distally. Walking without a significant limp.  X-rays of both knees from June 2015 are reviewed. She has mild degenerative changes in both knees.       Assessment & Plan:  Bilateral knee pain secondary to DJD  Patient's knees are injected with cortisone. This is done using an anterior lateral approach after risks and benefits were explained. Patient tolerated this without difficulty. She will continue with her orthotics. She also has a good understanding of her home exercises. Follow-up as needed.  Consent obtained and verified. Time-out  conducted. Noted no overlying erythema, induration, or other signs of local infection. Skin prepped in a sterile fashion. Topical analgesic spray: Ethyl chloride. Joint: left knee Needle: 22g 1.5 inch Completed without difficulty. Meds: 3cc 1% xylocaine, 1cc ( ) depomedrol  Advised to call if fevers/chills, erythema, induration, drainage, or persistent bleeding.  Consent obtained and verified. Time-out conducted. Noted no overlying erythema, induration, or other signs of local infection. Skin prepped in a sterile fashion. Topical analgesic spray: Ethyl chloride. Joint: right knee Needle: 22g 1.5 inch Completed without difficulty. Meds: 3cc 1% xylocaine, 1cc ( ) depomedrol  Advised to call if fevers/chills, erythema, induration, drainage, or persistent bleeding.

## 2015-01-02 DIAGNOSIS — F411 Generalized anxiety disorder: Secondary | ICD-10-CM | POA: Diagnosis not present

## 2015-01-02 DIAGNOSIS — F902 Attention-deficit hyperactivity disorder, combined type: Secondary | ICD-10-CM | POA: Diagnosis not present

## 2015-01-02 DIAGNOSIS — F332 Major depressive disorder, recurrent severe without psychotic features: Secondary | ICD-10-CM | POA: Diagnosis not present

## 2015-01-06 ENCOUNTER — Ambulatory Visit (INDEPENDENT_AMBULATORY_CARE_PROVIDER_SITE_OTHER): Payer: Medicare PPO | Admitting: Family Medicine

## 2015-01-06 ENCOUNTER — Encounter: Payer: Self-pay | Admitting: Family Medicine

## 2015-01-06 VITALS — BP 144/76 | HR 99 | Temp 98.4°F | Resp 16 | Ht 64.0 in | Wt 179.8 lb

## 2015-01-06 DIAGNOSIS — Z7185 Encounter for immunization safety counseling: Secondary | ICD-10-CM

## 2015-01-06 DIAGNOSIS — R739 Hyperglycemia, unspecified: Secondary | ICD-10-CM

## 2015-01-06 DIAGNOSIS — Z7189 Other specified counseling: Secondary | ICD-10-CM | POA: Diagnosis not present

## 2015-01-06 DIAGNOSIS — Z23 Encounter for immunization: Secondary | ICD-10-CM

## 2015-01-06 LAB — HEMOGLOBIN A1C
HEMOGLOBIN A1C: 6.5 % — AB (ref 4.0–6.0)
Hgb A1c MFr Bld: 6.5 % — AB (ref 4.0–6.0)

## 2015-01-06 LAB — POCT GLYCOSYLATED HEMOGLOBIN (HGB A1C): Hemoglobin A1C: 6.5

## 2015-01-06 NOTE — Patient Instructions (Addendum)
H1N1 Influenza (swine flu) Vaccine injection What is this medicine? H1N1 INFLUENZA (SWINE FLU) VACCINE (H1N1 in floo EN zuh (swahyn floo) vak SEEN) is a vaccine to protect from an infection with the pandemic H1N1 flu, also known as the swine flu. The vaccine only helps protect you against this one strain of the flu. This vaccine does not help to the reduce the risk of getting other types of flu. You may also need to get the seasonal influenza virus vaccine. This medicine may be used for other purposes; ask your health care provider or pharmacist if you have questions. What should I tell my health care provider before I take this medicine? They need to know if you have any of these conditions: -Guillain-Barre syndrome -immune system problems -an unusual or allergic reaction to influenza vaccine, eggs, neomycin, polymyxin, other medicines, foods, dyes or preservatives -pregnant or trying to get pregnant -breast-feeding How should I use this medicine? This vaccine is for injection into a muscle. It is given by a health care professional. A copy of Vaccine Information Statements will be given before each vaccination. Read this sheet carefully each time. The sheet may change frequently. Talk to your pediatrician regarding the use of this medicine in children. Special care may be needed. While this drug may be prescribed for children as young as 6 months for selected conditions, precautions do apply. Overdosage: If you think you have taken too much of this medicine contact a poison control center or emergency room at once. NOTE: This medicine is only for you. Do not share this medicine with others. What if I miss a dose? If needed, keep appointments for follow-up (booster) doses as directed. It is important not to miss your dose. Call your doctor or health care professional if you are unable to keep an appointment. What may interact with this medicine? -anakinra -medicines for organ  transplant -medicines to treat cancer -other vaccines -rilonacept -steroid medicines like prednisone or cortisone -tumor necrosis factor (TNF) modifiers like adalimumab, etanercept, infliximab, golimumab, or certolizumab This list may not describe all possible interactions. Give your health care provider a list of all the medicines, herbs, non-prescription drugs, or dietary supplements you use. Also tell them if you smoke, drink alcohol, or use illegal drugs. Some items may interact with your medicine. What should I watch for while using this medicine? Report any side effects to your doctor right away. This vaccine lowers your risk of getting the pandemic H1N1 flu. You can get a milder H1N1 flu infection if you are around others with this flu. This flu vaccine will not protect against colds or other illnesses including other flu viruses. You may also need the seasonal influenza vaccine. What side effects may I notice from receiving this medicine? Side effects that you should report to your doctor or health care professional as soon as possible: -allergic reactions like skin rash, itching or hives, swelling of the face, lips, or tongue -breathing problems -muscle weakness -unusual drooping or paralysis of face Side effects that usually do not require medical attention (Report these to your doctor or health care professional if they continue or are bothersome.): -chills -cough -headache -muscle aches and pains -runny or stuffy nose -sore throat -stomach upset -tiredness This list may not describe all possible side effects. Call your doctor for medical advice about side effects. You may report side effects to FDA at 1-800-FDA-1088. Where should I keep my medicine? This vaccine is only given in a clinic, pharmacy, doctor's office, or other  health care setting and will not be stored at home. NOTE: This sheet is a summary. It may not cover all possible information. If you have questions about  this medicine, talk to your doctor, pharmacist, or health care provider.    2016, Elsevier/Gold Standard. (2008-01-02 16:49:51) Influenza Virus Vaccine injection (Fluarix) What is this medicine? INFLUENZA VIRUS VACCINE (in floo EN zuh VAHY ruhs vak SEEN) helps to reduce the risk of getting influenza also known as the flu. This medicine may be used for other purposes; ask your health care provider or pharmacist if you have questions. What should I tell my health care provider before I take this medicine? They need to know if you have any of these conditions: -bleeding disorder like hemophilia -fever or infection -Guillain-Barre syndrome or other neurological problems -immune system problems -infection with the human immunodeficiency virus (HIV) or AIDS -low blood platelet counts -multiple sclerosis -an unusual or allergic reaction to influenza virus vaccine, eggs, chicken proteins, latex, gentamicin, other medicines, foods, dyes or preservatives -pregnant or trying to get pregnant -breast-feeding How should I use this medicine? This vaccine is for injection into a muscle. It is given by a health care professional. A copy of Vaccine Information Statements will be given before each vaccination. Read this sheet carefully each time. The sheet may change frequently. Talk to your pediatrician regarding the use of this medicine in children. Special care may be needed. Overdosage: If you think you have taken too much of this medicine contact a poison control center or emergency room at once. NOTE: This medicine is only for you. Do not share this medicine with others. What if I miss a dose? This does not apply. What may interact with this medicine? -chemotherapy or radiation therapy -medicines that lower your immune system like etanercept, anakinra, infliximab, and adalimumab -medicines that treat or prevent blood clots like warfarin -phenytoin -steroid medicines like prednisone or  cortisone -theophylline -vaccines This list may not describe all possible interactions. Give your health care provider a list of all the medicines, herbs, non-prescription drugs, or dietary supplements you use. Also tell them if you smoke, drink alcohol, or use illegal drugs. Some items may interact with your medicine. What should I watch for while using this medicine? Report any side effects that do not go away within 3 days to your doctor or health care professional. Call your health care provider if any unusual symptoms occur within 6 weeks of receiving this vaccine. You may still catch the flu, but the illness is not usually as bad. You cannot get the flu from the vaccine. The vaccine will not protect against colds or other illnesses that may cause fever. The vaccine is needed every year. What side effects may I notice from receiving this medicine? Side effects that you should report to your doctor or health care professional as soon as possible: -allergic reactions like skin rash, itching or hives, swelling of the face, lips, or tongue Side effects that usually do not require medical attention (report to your doctor or health care professional if they continue or are bothersome): -fever -headache -muscle aches and pains -pain, tenderness, redness, or swelling at site where injected -weak or tired This list may not describe all possible side effects. Call your doctor for medical advice about side effects. You may report side effects to FDA at 1-800-FDA-1088. Where should I keep my medicine? This vaccine is only given in a clinic, pharmacy, doctor's office, or other health care setting and will not be  stored at home. NOTE: This sheet is a summary. It may not cover all possible information. If you have questions about this medicine, talk to your doctor, pharmacist, or health care provider.    2016, Elsevier/Gold Standard. (2007-08-30 09:30:40)

## 2015-01-06 NOTE — Progress Notes (Signed)
This chart was scribed for Renee Sidle, MD by Stann Ore, medical scribe at [facility].The patient was seen in exam room 8 and the patient's care was started at 12:07 PM.  Patient ID: Renee Young MRN: [id number], DOB: 03/10/1945, 69 y.o. Date of Encounter: 01/06/2015  Primary Physician: Renee Sidle, MD  Chief Complaint:  Chief Complaint  Patient presents with   Follow-up    from her visit with Dr. Darrick Penna   blood work    would like her A1C checked   Flu Vaccine    HPI:  Renee Young is a 69 y.o. female who presents to [facility] for follow up from her visit with Dr. Darrick Penna.  She would like to have her A1c checked. Her last A1c was 6.4 in Nov 2015. She denies having a machine at home anymore. She's been eating well.   She also requested a flu vaccine.   She came into the office with her husband.  She used to teach Latin.   Past Medical History  Diagnosis Date   Allergy    Anemia    Arthritis    Depression    Diabetes mellitus without complication (HCC)    UTI (urinary tract infection)    Kidney stones    ADD (attention deficit disorder)      Home Meds: Prior to Admission medications   Medication Sig Start Date End Date Taking? Authorizing Provider  amphetamine-dextroamphetamine (ADDERALL) 30 MG tablet Take 20 mg by mouth daily.    Yes Historical Provider, MD  FLUoxetine (PROZAC) 20 MG capsule Take 20 mg by mouth 2 (two) times daily.    Yes Historical Provider, MD    Allergies:  Allergies  Allergen Reactions   Nitrofurantoin    Penicillins     Social History   Social History   Marital Status: Married    Spouse Name: N/A   Number of Children: N/A   Years of Education: N/A   Occupational History   Not on file.   Social History Main Topics   Smoking status: Never Smoker    Smokeless tobacco: Never Used   Alcohol Use: 1.2 oz/week    2 Glasses of wine per week   Drug Use: No    Sexual Activity: Yes   Other Topics Concern   Not on file   Social History Narrative   Married   Retired Financial trader        Review of Systems: Constitutional: negative for fever, chills, night sweats, weight changes, or fatigue  HEENT: negative for vision changes, hearing loss, congestion, rhinorrhea, ST, epistaxis, or sinus pressure Cardiovascular: negative for chest pain or palpitations Respiratory: negative for hemoptysis, wheezing, shortness of breath, or cough Abdominal: negative for abdominal pain, nausea, vomiting, diarrhea, or constipation Dermatological: negative for rash Neurologic: negative for headache, dizziness, or syncope All other systems reviewed and are otherwise negative with the exception to those above and in the HPI.  Physical Exam: Blood pressure 144/76, pulse 99, temperature 98.4 F (36.9 C), temperature source Oral, resp. rate 16, height  (1.626 m), weight 179 lb 12.8 oz (81.557 kg), SpO2 98 %., Body mass index is 30.85 kg/(m^2). General: Well developed, well nourished, in no acute distress. Head: Normocephalic, atraumatic, eyes without discharge, sclera non-icteric, nares are without discharge. Bilateral auditory canals clear, TM's are without perforation, pearly grey and translucent with reflective cone of light bilaterally. Oral cavity moist, posterior pharynx without exudate, erythema, peritonsillar abscess,  or post nasal drip.  Neck: Supple. No thyromegaly. Full ROM. No lymphadenopathy. Lungs: Clear bilaterally to auscultation without wheezes, rales, or rhonchi. Breathing is unlabored. Heart: RRR with S1 S2. No murmurs, rubs, or gallops appreciated. Msk:  Strength and tone normal for age. Extremities/Skin: Warm and dry. No clubbing or cyanosis. No edema. No rashes or suspicious lesions. Neuro: Alert and oriented X 3. Moves all extremities spontaneously. Gait is normal. CNII-XII grossly in tact. Psych:  Responds to questions appropriately  with a normal affect.   Labs: Results for orders placed or performed in visit on 01/06/15  POCT glycosylated hemoglobin (Hb A1C)  Result Value Ref Range   Hemoglobin A1C 6.5      ASSESSMENT AND PLAN:  69 y.o. year old female with h/o hyperglycemia This chart was scribed in my presence and reviewed by me personally.    ICD-9-CM ICD-10-CM   1. Hyperglycemia 790.29 R73.9 POCT glycosylated hemoglobin (Hb A1C)  2. Immunization counseling V65.49 Z71.89 Flu Vaccine QUAD 36+ mos IM     Signed, Renee SidleKurt Lauenstein, MD    By signing my name below, I, Stann Oresung-Kai Tsai, attest that this documentation has been prepared under the direction and in the presence of Renee SidleKurt Lauenstein, MD. Electronically Signed: Stann Oresung-Kai Tsai, Scribe. 01/06/2015 , 12:07 PM .  Signed, Renee SidleKurt Lauenstein, MD 01/06/2015 12:07 PM

## 2015-01-15 ENCOUNTER — Encounter: Payer: Self-pay | Admitting: Family Medicine

## 2015-01-15 ENCOUNTER — Telehealth: Payer: Self-pay | Admitting: Family Medicine

## 2015-01-15 NOTE — Telephone Encounter (Signed)
Spoke with patient and she had [facility] eye exam [facility].  Will call and get the report and [facility] the chart and [facility].

## 2015-01-20 ENCOUNTER — Telehealth: Payer: Self-pay

## 2015-01-20 DIAGNOSIS — H9193 Unspecified hearing loss, bilateral: Secondary | ICD-10-CM

## 2015-01-20 NOTE — Telephone Encounter (Signed)
Spoke with pt, she states she thinks she has some hearing loss and wants to go to this clinic. Can we refer or does pt have to come in?

## 2015-01-20 NOTE — Telephone Encounter (Signed)
Pt states that she is needing a written referral to [facility]  WUJ:[id number]:Olga -it has already been scheduled and needs to be faxed today to [phone]

## 2015-01-21 DIAGNOSIS — H903 Sensorineural hearing loss, bilateral: Secondary | ICD-10-CM | POA: Diagnosis not present

## 2015-01-21 NOTE — Telephone Encounter (Signed)
Great!

## 2015-01-21 NOTE — Telephone Encounter (Signed)
Referral note was pended and signed by Dr Milus GlazierLauenstein.  [facility] and confirmed that she received it, and it was acceptable.  Patient was able to be seen at her appointment today.

## 2015-01-21 NOTE — Telephone Encounter (Signed)
Shawna OrleansMelanie did you take care of this?

## 2015-01-30 ENCOUNTER — Encounter: Payer: Self-pay | Admitting: Family Medicine

## 2015-05-14 ENCOUNTER — Ambulatory Visit (INDEPENDENT_AMBULATORY_CARE_PROVIDER_SITE_OTHER): Payer: Medicare Other | Admitting: Family Medicine

## 2015-05-14 VITALS — BP 110/66 | HR 64 | Temp 98.0°F | Resp 17 | Ht 64.0 in | Wt 176.0 lb

## 2015-05-14 DIAGNOSIS — M25561 Pain in right knee: Secondary | ICD-10-CM

## 2015-05-14 DIAGNOSIS — M25562 Pain in left knee: Secondary | ICD-10-CM

## 2015-05-14 DIAGNOSIS — L237 Allergic contact dermatitis due to plants, except food: Secondary | ICD-10-CM | POA: Diagnosis not present

## 2015-05-14 MED ORDER — TRIAMCINOLONE ACETONIDE 0.1 % EX CREA: 1.0000 "application " | TOPICAL_CREAM | Freq: Three times a day (TID) | CUTANEOUS | Status: DC

## 2015-05-14 NOTE — Progress Notes (Signed)
By signing my name below, I, Stann Oresung-Kai Tsai, attest that this documentation has been prepared under the direction and in the presence of Elvina SidleKurt Ailen Strauch, MD. Electronically Signed: Stann Oresung-Kai Tsai, Scribe. 05/14/2015 , 3:45 PM .  Patient was seen in room 11 .   Patient ID: Renee Young MRN: [id number], DOB: 04/14/1945, 70 y.o. Date of Encounter: 05/14/2015  Primary Physician: Elvina SidleLAUENSTEIN,Dearion Huot, MD  Chief Complaint:  Chief Complaint  Patient presents with  . Knee Pain    mostly left but both hurt     HPI:  Renee Simmeringnne M Labine is a 70 y.o. female who presents to [facility] complaining of bilateral knee pain (L>R) that started over 10 years ago. She's had injections done in the past for relief. Her last injection was in August 2016. Her left knee would cause her to wake up at night due to pain.   She also notes having a rash over heft left forearm starting a week ago. She believes she's had some contact with poison while working in her yard.   She used to teach Latin.   Past Medical History  Diagnosis Date  . Allergy   . Anemia   . Arthritis   . Depression   . Diabetes mellitus without complication (HCC)   . UTI (urinary tract infection)   . Kidney stones   . ADD (attention deficit disorder)      Home Meds: Prior to Admission medications   Medication Sig Start Date End Date Taking? Authorizing Provider  amphetamine-dextroamphetamine (ADDERALL) 30 MG tablet Take 20 mg by mouth daily.    Yes Historical Provider, MD  co-enzyme Q-10 30 MG capsule Take 30 mg by mouth 3 (three) times daily.   Yes Historical Provider, MD  FLUoxetine (PROZAC) 20 MG capsule Take 20 mg by mouth 2 (two) times daily.    Yes Historical Provider, MD    Allergies:  Allergies  Allergen Reactions  . Nitrofurantoin   . Penicillins     Social History   Social History  . Marital Status: Married    Spouse Name: N/A  . Number of Children: N/A  . Years of Education: N/A   Occupational  History  . Not on file.   Social History Main Topics  . Smoking status: Never Smoker   . Smokeless tobacco: Never Used  . Alcohol Use: 1.2 oz/week    2 Glasses of wine per week  . Drug Use: No  . Sexual Activity: Yes   Other Topics Concern  . Not on file   Social History Narrative   Married   Retired Financial traderTeacher   Exercises        Review of Systems: Constitutional: negative for fever, chills, night sweats, weight changes, or fatigue  HEENT: negative for vision changes, hearing loss, congestion, rhinorrhea, ST, epistaxis, or sinus pressure Cardiovascular: negative for chest pain or palpitations Respiratory: negative for hemoptysis, wheezing, shortness of breath, or cough Abdominal: negative for abdominal pain, nausea, vomiting, diarrhea, or constipation Dermatological: positive for rash Musc: positive for arthralgia (bilateral knee)  Neurologic: negative for headache, dizziness, or syncope All other systems reviewed and are otherwise negative with the exception to those above and in the HPI.  Physical Exam: Blood pressure 110/66, pulse 64, temperature 98 F (36.7 C), temperature source Oral, resp. rate 17, height 5\' 4"  (1.626 m), weight 176 lb (79.833 kg), SpO2 98 %., Body mass index is 30.2 kg/(m^2). General: Well developed, well nourished, in no acute distress. Head: Normocephalic, atraumatic,  eyes without discharge, sclera non-icteric, nares are without discharge. Bilateral auditory canals clear, TM's are without perforation, pearly grey and translucent with reflective cone of light bilaterally. Oral cavity moist, posterior pharynx without exudate, erythema, peritonsillar abscess, or post nasal drip.  Neck: Supple. No thyromegaly. Full ROM. No lymphadenopathy. Lungs: Clear bilaterally to auscultation without wheezes, rales, or rhonchi. Breathing is unlabored. Heart: RRR with S1 S2. No murmurs, rubs, or gallops appreciated. Abdomen: Soft, non-tender, non-distended with  normoactive bowel sounds. No hepatomegaly. No rebound/guarding. No obvious abdominal masses. Msk:  Strength and tone normal for age.Patient has moderate synovial thickening both knees with no effusion and no localized tenderness. Extremities/Skin: Warm and dry. No clubbing or cyanosis. No edema. No rashes or suspicious lesions.Multiple papules on her forearms consistent with poison ivy Neuro: Alert and oriented X 3. Moves all extremities spontaneously. Gait is normal. CNII-XII grossly in tact. Psych:  Responds to questions appropriately with a normal affect.    Each knee injected with 1cc marcaine and 1cc DepoMedrol without complications; good rom  ASSESSMENT AND PLAN:  70 y.o. year old female with Knee pain, left  Right knee pain  Poison ivy - Plan: triamcinolone cream (KENALOG) 0.1 %  This chart was scribed in my presence and reviewed by me personally.     Signed, [name], MD 05/14/2015 3:45 PM

## 2015-05-14 NOTE — Patient Instructions (Addendum)
Let me know if pain does not subside over the next week or so

## 2015-09-02 ENCOUNTER — Ambulatory Visit (INDEPENDENT_AMBULATORY_CARE_PROVIDER_SITE_OTHER): Payer: Medicare Other | Admitting: Physician Assistant

## 2015-09-02 VITALS — BP 122/72 | HR 97 | Temp 98.0°F | Resp 17 | Ht 64.0 in | Wt 176.0 lb

## 2015-09-02 DIAGNOSIS — L309 Dermatitis, unspecified: Secondary | ICD-10-CM | POA: Diagnosis not present

## 2015-09-02 MED ORDER — TRIAMCINOLONE ACETONIDE 0.025 % EX OINT: 1.0000 "application " | TOPICAL_OINTMENT | Freq: Two times a day (BID) | CUTANEOUS | Status: DC

## 2015-09-02 NOTE — Patient Instructions (Addendum)
Apply a thin layer of triamcinole ointment to affected areas two times a day for two weeks. Buy Eucerin cream and apply daily. If no improvement in 2 weeks come back. If symptoms worsen come back sooner.    Eczema Eczema, also called atopic dermatitis, is a skin disorder that causes inflammation of the skin. It causes a red rash and dry, scaly skin. The skin becomes very itchy. Eczema is generally worse during the cooler winter months and often improves with the warmth of summer. Eczema usually starts showing signs in infancy. Some children outgrow eczema, but it may last through adulthood.  CAUSES  The exact cause of eczema is not known, but it appears to run in families. People with eczema often have a family history of eczema, allergies, asthma, or hay fever. Eczema is not contagious. Flare-ups of the condition may be caused by:   Contact with something you are sensitive or allergic to.   Stress. SIGNS AND SYMPTOMS  Dry, scaly skin.   Red, itchy rash.   Itchiness. This may occur before the skin rash and may be very intense.  DIAGNOSIS  The diagnosis of eczema is usually made based on symptoms and medical history. TREATMENT  Eczema cannot be cured, but symptoms usually can be controlled with treatment and other strategies. A treatment plan might include:  Controlling the itching and scratching.   Use over-the-counter antihistamines as directed for itching. This is especially useful at night when the itching tends to be worse.   Use over-the-counter steroid creams as directed for itching.   Avoid scratching. Scratching makes the rash and itching worse. It may also result in a skin infection (impetigo) due to a break in the skin caused by scratching.   Keeping the skin well moisturized with creams every day. This will seal in moisture and help prevent dryness. Lotions that contain alcohol and water should be avoided because they can dry the skin.   Limiting exposure to  things that you are sensitive or allergic to (allergens).   Recognizing situations that cause stress.   Developing a plan to manage stress.  HOME CARE INSTRUCTIONS   Only take over-the-counter or prescription medicines as directed by your health care provider.   Do not use anything on the skin without checking with your health care provider.   Keep baths or showers short (5 minutes) in warm (not hot) water. Use mild cleansers for bathing. These should be unscented. You may add nonperfumed bath oil to the bath water. It is best to avoid soap and bubble bath.   Immediately after a bath or shower, when the skin is still damp, apply a moisturizing ointment to the entire body. This ointment should be a petroleum ointment. This will seal in moisture and help prevent dryness. The thicker the ointment, the better. These should be unscented.   Keep fingernails cut short. Children with eczema may need to wear soft gloves or mittens at night after applying an ointment.   Dress in clothes made of cotton or cotton blends. Dress lightly, because heat increases itching.   A child with eczema should stay away from anyone with fever blisters or cold sores. The virus that causes fever blisters (herpes simplex) can cause a serious skin infection in children with eczema. SEEK MEDICAL CARE IF:   Your itching interferes with sleep.   Your rash gets worse or is not better within 1 week after starting treatment.   You see pus or soft yellow scabs in the rash  area.   You have a fever.   You have a rash flare-up after contact with someone who has fever blisters.    This information is not intended to replace advice given to you by your health care provider. Make sure you discuss any questions you have with your health care provider.   Document Released: 01/30/2000 Document Revised: 11/22/2012 Document Reviewed: 09/04/2012 Elsevier Interactive Patient Education 2016 [employer]     IF  you received an x-ray today, you will receive an invoice from [facility]. Please contact [facility] at [phone] with questions or concerns regarding your invoice.   IF you received labwork today, you will receive an invoice from [facility]. Please contact [facility] at [phone] with questions or concerns regarding your invoice.   Our billing staff will not be able to assist you with questions regarding bills from these companies.  You will be contacted with the lab results as soon as they are available. The fastest way to get your results is to activate your My Chart account. Instructions are located on the last page of this paperwork. If you have not heard from us regarding the results in 2 weeks, please contact this office.     We recommend that you schedule a mammogram for breast cancer screening. Typically, you do not need a referral to do this. Please contact a local imaging center to schedule your mammogram.  [facility] - [phone]  *ask for the Radiology Department [facility]) - [phone]) [phone] or [phone]) [phone]  [facility] - [phone] [facility] - [phone] [facility] - [phone]  *ask for the Radiology Department [facility] - [phone]  *ask for the Radiology Department [facility] - [phone]  *ask for the Mammography Department [facility] - [phone]) [phone]

## 2015-09-02 NOTE — Progress Notes (Signed)
Renee Young  MRN: [id number] DOB: 09-08-1945  Subjective:  Renee Young is a 70 y.o. female seen in office today for a chief complaint of rash on both legs, trunk and back x 6 months. Pt notes the rash progresses through stages. When it first appears, it is extremely itching and sometimes develops blisters. When pt scratches the rash during this stage, has associated burning. The intermediate stage does not itch but scabs over and the late stage is a non-itching brown/purplish plaque.    Patient has tried antifungal cream, hydrocoritsone, and lotion with minimal relief. Admits to not trying any of these for an extended period of time. Has also stopped coQ10 supplement, changed laundry detergent and soaps to a more skin sensitive brand.   No recent exposure to ticks. Is outside quite a bit and did have poison oak a couple weeks ago, healed appropriately.   FH of psoriasis (father), RA (aunt), autoimmune hepatitis (sister), no FH of skin cancer    Review of Systems  Constitutional: Positive for fatigue (primarily this summer since the weather has been hot). Negative for fever and chills.  Respiratory: Negative for cough and shortness of breath.   Cardiovascular: Negative for chest pain.  Gastrointestinal: Negative for nausea, vomiting and diarrhea.  Genitourinary: Negative for dysuria. Vaginal pain: normal for her.  Musculoskeletal: Positive for arthralgias. Negative for myalgias.  Neurological: Negative for dizziness and light-headedness.    Patient Active Problem List   Diagnosis Date Noted  . Abnormality of gait 06/26/2014  . Varicose veins of lower extremities with other complications 07/03/2012  . Leg swelling 07/03/2012  . Pain in limb 07/03/2012  . DJD (degenerative joint disease) of knee 03/21/2009  . VIRAL URI 02/27/2008  . OTHER VIRAL CONJUNCTIVITIS 01/23/2008  . DIABETES MELLITUS, TYPE II 07/19/2007  . HYPERLIPIDEMIA 07/19/2007  . DEPRESSION 07/19/2007  . ATTENTION  DEFICIT DISORDER 07/19/2007  . ASYMPTOMATIC VARICOSE VEINS 07/19/2007    Current Outpatient Prescriptions on File Prior to Visit  Medication Sig Dispense Refill  . amphetamine-dextroamphetamine (ADDERALL) 30 MG tablet Take 40 mg by mouth daily.     Marland Kitchen FLUoxetine (PROZAC) 20 MG capsule Take 20 mg by mouth 3 (three) times daily.      No current facility-administered medications on file prior to visit.    Allergies  Allergen Reactions  . Nitrofurantoin   . Penicillins     Objective:  BP 122/72 mmHg  Pulse 97  Temp(Src) 98 F (36.7 C) (Oral)  Resp 17  Ht  (1.626 m)  Wt 176 lb (79.833 kg)  BMI 30.20 kg/m2  SpO2 98%  Physical Exam  Constitutional: She is oriented to person, place, and time and well-developed, well-nourished, and in no distress.  HENT:  Head: Normocephalic and atraumatic.  Eyes: Conjunctivae are normal.  Neck: Normal range of motion.  Cardiovascular: Normal rate and normal heart sounds.   Pulmonary/Chest: Effort normal and breath sounds normal.  Neurological: She is alert and oriented to person, place, and time. Gait normal.  Skin: Skin is warm and dry.     Psychiatric: Affect normal.  Vitals reviewed.   Assessment and Plan :   1. Dermatitis -Etiology unknown, will begin therapy with steroid cream BID x 2 wks and daily use of lotion for pt's dry skin. - triamcinolone (KENALOG) 0.025 % ointment; Apply 1 application topically 2 (two) times daily.  Dispense: 30 g; Refill: 0 -If no improvement in 2 weeks, pt to return to clinic for reevaluation. If symptoms  worsen, come back sooner.   Benjiman CoreBrittany Deosha Werden PA-C  [facility] 09/02/2015 2:07 PM

## 2015-09-05 ENCOUNTER — Ambulatory Visit (INDEPENDENT_AMBULATORY_CARE_PROVIDER_SITE_OTHER): Payer: Medicare Other | Admitting: Family Medicine

## 2015-09-05 VITALS — BP 142/80 | HR 94 | Temp 97.5°F | Resp 16 | Ht 64.0 in | Wt 175.2 lb

## 2015-09-05 DIAGNOSIS — M25562 Pain in left knee: Secondary | ICD-10-CM

## 2015-09-05 DIAGNOSIS — M170 Bilateral primary osteoarthritis of knee: Secondary | ICD-10-CM | POA: Diagnosis not present

## 2015-09-05 NOTE — Patient Instructions (Addendum)
  Thank you for coming in today. Return as needed.  You can expect improvement in a few days.  Return if worse.  If no better follow up with [facility] for guided injection.  Call or go to the ER if you develop a large red swollen joint with extreme pain or oozing puss.     IF you received an x-ray today, you will receive an invoice from [facility]. Please contact [facility] at [phone] with questions or concerns regarding your invoice.   IF you received labwork today, you will receive an invoice from [facility]. Please contact [facility] at [phone] with questions or concerns regarding your invoice.   Our billing staff will not be able to assist you with questions regarding bills from these companies.  You will be contacted with the lab results as soon as they are available. The fastest way to get your results is to activate your My Chart account. Instructions are located on the last page of this paperwork. If you have not heard from us regarding the results in 2 weeks, please contact this office.

## 2015-09-05 NOTE — Progress Notes (Signed)
    Kellie Simmeringnne M Trafton is a 70 y.o. female who [facility] today for left knee pain.   Pt noted chronic left knee pain due to DJD. She has had repeated steroid injections that typically last 3-6 months. She notes that the left knee injection she had most recently lasted 2 months. She notes pain with activity and with rest. She takes over the counter medicines that help some as well. No fever, chills, NVD.    Past Medical History  Diagnosis Date  . Allergy   . Anemia   . Arthritis   . Depression   . Diabetes mellitus without complication (HCC)   . UTI (urinary tract infection)   . Kidney stones   . ADD (attention deficit disorder)    Past Surgical History  Procedure Laterality Date  . Cesarean section    . Tubal ligation    . Ureterocele incision     Social History  Substance Use Topics  . Smoking status: Never Smoker   . Smokeless tobacco: Never Used  . Alcohol Use: 1.2 oz/week    2 Glasses of wine per week   ROS as above Medications: Current Outpatient Prescriptions  Medication Sig Dispense Refill  . amphetamine-dextroamphetamine (ADDERALL) 30 MG tablet Take 40 mg by mouth daily.     Marland Kitchen. FLUoxetine (PROZAC) 20 MG capsule Take 20 mg by mouth 3 (three) times daily.     Marland Kitchen. triamcinolone (KENALOG) 0.025 % ointment Apply 1 application topically 2 (two) times daily. 30 g 0   No current facility-administered medications for this visit.   Allergies  Allergen Reactions  . Nitrofurantoin   . Penicillins      Exam:  BP 142/80 mmHg  Pulse 94  Temp(Src) 97.5 F (36.4 C) (Oral)  Resp 16  Ht 5\' 4"  (1.626 m)  Wt 175 lb 3.2 oz (79.47 kg)  BMI 30.06 kg/m2  SpO2 97% Gen: Well NAD Left knee: Mild effusion. Otherwise normal appearing.  Normal ROM with 1+ crepitations.  Stable Ligamentous exam. Normal strength.  X-ray knees bilaterally dated June 2015 reviewed showing DJD.  Knee injection. left Consent obtained and timeout performed. Patient seated in a comfortable  position with legs hanging off the table.  The medial Peri-patellar tendon space was palpated and marked. The skin was then cleaned with alcohol. Cold spray applied. A 25-gauge inch and a half needle was used to inject 80 mg of Depo-Medrol and 4 mL of lidocaine. Patient tolerated procedure well no bleeding. Pain improved following injection   No results found for this or any previous visit (from the past 24 hour(s)). No results found.  Assessment and Plan: 70 y.o. female with knee pain due to DJD. Steroid injection today. Return as needed. If no significant benefit would recommend a trial of ultrasound guided injection at the sports medicine Center.   Discussed warning signs or symptoms. Please see discharge instructions. Patient expresses understanding.

## 2015-10-13 ENCOUNTER — Other Ambulatory Visit: Payer: Self-pay | Admitting: Family Medicine

## 2015-10-13 DIAGNOSIS — Z1231 Encounter for screening mammogram for malignant neoplasm of breast: Secondary | ICD-10-CM

## 2015-10-27 ENCOUNTER — Ambulatory Visit
Admission: RE | Admit: 2015-10-27 | Discharge: 2015-10-27 | Disposition: A | Discharge status: Home / Self Care | Payer: Medicare PPO | Source: Ambulatory Visit | Attending: Family Medicine | Admitting: Family Medicine

## 2015-10-27 DIAGNOSIS — Z1231 Encounter for screening mammogram for malignant neoplasm of breast: Secondary | ICD-10-CM

## 2016-03-17 ENCOUNTER — Other Ambulatory Visit: Payer: Self-pay | Admitting: Family Medicine

## 2016-03-18 ENCOUNTER — Ambulatory Visit (INDEPENDENT_AMBULATORY_CARE_PROVIDER_SITE_OTHER): Payer: Medicare Other | Admitting: Sports Medicine

## 2016-03-18 ENCOUNTER — Other Ambulatory Visit: Payer: Self-pay | Admitting: *Deleted

## 2016-03-18 ENCOUNTER — Encounter: Payer: Self-pay | Admitting: Sports Medicine

## 2016-03-18 ENCOUNTER — Ambulatory Visit
Admission: RE | Admit: 2016-03-18 | Discharge: 2016-03-18 | Disposition: A | Discharge status: Home / Self Care | Payer: Medicare Other | Source: Ambulatory Visit | Attending: Sports Medicine | Admitting: Sports Medicine

## 2016-03-18 VITALS — BP 146/55 | HR 97 | Ht 64.0 in | Wt 170.0 lb

## 2016-03-18 DIAGNOSIS — M25561 Pain in right knee: Secondary | ICD-10-CM

## 2016-03-18 DIAGNOSIS — M25562 Pain in left knee: Secondary | ICD-10-CM

## 2016-03-18 DIAGNOSIS — M170 Bilateral primary osteoarthritis of knee: Secondary | ICD-10-CM | POA: Diagnosis not present

## 2016-03-18 DIAGNOSIS — M1712 Unilateral primary osteoarthritis, left knee: Secondary | ICD-10-CM

## 2016-03-18 MED ORDER — METHYLPREDNISOLONE ACETATE 40 MG/ML IJ SUSP
40.0000 mg | Freq: Once | INTRAMUSCULAR | Status: AC
  Administered 2016-03-18: 40 mg via INTRA_ARTICULAR

## 2016-03-19 NOTE — Progress Notes (Signed)
   Subjective:    Patient ID: Renee Young, female    DOB: 09/08/1945, 71 y.o.   MRN: [id number]  HPI chief complaint: Left knee pain  71 year old female comes in today complaining of returning left knee pain. She has a history of left knee DJD. She has had several injections in the past. Her last injection was in July. Each injection has been beneficial. She usually has several months of pain relief with these. She denies any trauma. She does notice intermittent swelling. She denies mechanical symptoms. Last set of x-rays were from 2015 and they showed only mild degenerative changes in both knees. She denies radiating pain. Pain is worse with activity. Improves at rest. She's also had a prior round of Visco supplementation which was not very successful.  Interim medical history reviewed Medications reviewed Allergies reviewed    Review of Systems    as above Objective:   Physical Exam  Well-developed well-nourished. No acute distress. Vital signs reviewed  Left knee: Range of motion is 0-120. Trace effusion. 1+ boggy synovitis. Slight tenderness to palpation along medial and lateral joint lines. Negative McMurray's. Good joint stability. Neurovascularly intact distally. Walking without significant limp.  X-rays of both knees including AP, lateral, 30 degree flexion, and sunrise views are obtained. X-rays are compared to those films done in 2015. In comparison, she has had some advancement of her osteoarthritis. She has moderate medial compartmental osteoarthritis in the left knee and moderate patellofemoral arthritis bilaterally. Nothing acute is seen.,      Assessment & Plan:  Returning left knee pain secondary to DJD  Patient's left knee is reinjected today. An anterior lateral approach is utilized. Patient was asking about ultrasound-guided cortisone injections but she has had excellent success with landmark based injections in the past. I certainly don't think her body habitus  precludes us from doing a landmark based injection. She tolerates this without difficulty. She'll follow-up for ongoing or recalcitrant issues.  Consent obtained and verified. Time-out conducted. Noted no overlying erythema, induration, or other signs of local infection. Skin prepped in a sterile fashion. Topical analgesic spray: Ethyl chloride. Joint: left knee Needle: 22g 1.5 inch Completed without difficulty. Meds: 1cc (40mg ) depomedrol, 3cc 1% xylocaine  Advised to call if fevers/chills, erythema, induration, drainage, or persistent bleeding.

## 2016-07-07 ENCOUNTER — Encounter: Payer: Self-pay | Admitting: Sports Medicine

## 2016-07-07 ENCOUNTER — Ambulatory Visit (INDEPENDENT_AMBULATORY_CARE_PROVIDER_SITE_OTHER): Payer: Medicare Other | Admitting: Sports Medicine

## 2016-07-07 VITALS — BP 154/72 | Ht 64.0 in | Wt 170.0 lb

## 2016-07-07 DIAGNOSIS — M170 Bilateral primary osteoarthritis of knee: Secondary | ICD-10-CM | POA: Diagnosis not present

## 2016-07-07 MED ORDER — METHYLPREDNISOLONE ACETATE 40 MG/ML IJ SUSP
40.0000 mg | Freq: Once | INTRAMUSCULAR | Status: AC
  Administered 2016-07-07: 40 mg via INTRA_ARTICULAR

## 2016-07-07 NOTE — Progress Notes (Signed)
   Subjective:    Patient ID: Renee Young, female    DOB: 08/11/1945, 71 y.o.   MRN: [id number]  HPI chief complaint: Bilateral knee pain  Patient comes in today requesting repeat cortisone injections into each knee. Her left knee was injected with cortisone back in February. This was helpful up until recently. [place] of [place]. She denies any recent trauma. She's not noticed any swelling. She does occasionally get some "locking" of the left knee but this typically occurs at rest and not with activity. It resolves on its own without needing to manipulate the knee. Recent x-rays shows moderately advanced medial compartmental DJD and patellofemoral DJD bilaterally.    Review of Systems As above     Objective:   Physical Exam  Well-developed, well-nourished. No acute distress. Awake alert and oriented 3. Vital signs reviewed  Examination of each knee shows range of motion from 0-120. No obvious effusion. 1+ boggy synovitis bilaterally. Slight tenderness to palpation along the medial joint lines bilaterally. Negative McMurray's. Good joint stability. Neurovascularly intact distally.  X-rays of both knees are as above      Assessment & Plan:  Bilateral knee pain secondary to DJD  I've agreed to reinject each knee today with cortisone. This was accomplished atraumatically under sterile technique utilizing an anterior lateral approach. Patient tolerated this without difficulty. If she does not get prolonged relief from today's injections then I would recommend Visco supplementation under ultrasound guidance by Dr. Terrilee FilesZach Smith. Patient will follow-up as needed.  Consent obtained and verified. Time-out conducted. Noted no overlying erythema, induration, or other signs of local infection. Skin prepped in a sterile fashion. Topical analgesic spray: Ethyl chloride. Joint: left knee Needle: 22g 1.5 inch Completed without difficulty. Meds: 3cc 1%  xylocaine, 1cc (40mg ) depomedrol  Advised to call if fevers/chills, erythema, induration, drainage, or persistent bleeding.  Consent obtained and verified. Time-out conducted. Noted no overlying erythema, induration, or other signs of local infection. Skin prepped in a sterile fashion. Topical analgesic spray: Ethyl chloride. Joint: right knee Needle: 22g 1.5 inch Completed without difficulty. Meds: 3cc 1% xyolcaine, 1cc (40mg ) depomedrol  Advised to call if fevers/chills, erythema, induration, drainage, or persistent bleeding.

## 2016-11-01 ENCOUNTER — Ambulatory Visit (INDEPENDENT_AMBULATORY_CARE_PROVIDER_SITE_OTHER): Payer: Medicare Other | Admitting: Physician Assistant

## 2016-11-01 ENCOUNTER — Encounter: Payer: Self-pay | Admitting: Physician Assistant

## 2016-11-01 VITALS — BP 132/79 | HR 98 | Temp 98.5°F | Resp 16 | Ht 64.0 in | Wt 176.0 lb

## 2016-11-01 DIAGNOSIS — H1033 Unspecified acute conjunctivitis, bilateral: Secondary | ICD-10-CM

## 2016-11-01 MED ORDER — ERYTHROMYCIN 5 MG/GM OP OINT: 1.0000 "application " | TOPICAL_OINTMENT | Freq: Four times a day (QID) | OPHTHALMIC | 0 refills | Status: DC

## 2016-11-01 NOTE — Patient Instructions (Addendum)
     IF you received an x-ray today, you will receive an invoice from [facility]. Please contact [facility] at [phone] with questions or concerns regarding your invoice.   IF you received labwork today, you will receive an invoice from [facility]. Please contact [facility] at [phone] with questions or concerns regarding your invoice.   Our billing staff will not be able to assist you with questions regarding bills from these companies.  You will be contacted with the lab results as soon as they are available. The fastest way to get your results is to activate your My Chart account. Instructions are located on the last page of this paperwork. If you have not heard from us regarding the results in 2 weeks, please contact this office.      Bacterial Conjunctivitis Bacterial conjunctivitis is an infection of your conjunctiva. This is the clear membrane that covers the white part of your eye and the inner surface of your eyelid. This condition can make your eye:  Red or pink.  Itchy.  This condition is caused by bacteria. This condition spreads very easily from person to person (is contagious) and from one eye to the other eye. Follow these instructions at home: Medicines  Take or apply your antibiotic medicine as told by your doctor. Do not stop taking or applying the antibiotic even if you start to feel better.  Take or apply over-the-counter and prescription medicines only as told by your doctor.  Do not touch your eyelid with the eye drop bottle or the ointment tube. Managing discomfort  Wipe any fluid from your eye with a warm, wet washcloth or a cotton ball.  Place a cool, clean washcloth on your eye. Do this for 10-20 minutes, 3-4 times per day. General instructions  Do not wear contact lenses until the irritation is gone. Wear glasses until your doctor says it is okay to wear contacts.  Do not wear eye makeup until your symptoms are gone. Throw away  any old makeup.  Change or wash your pillowcase every day.  Do not share towels or washcloths with anyone.  Wash your hands often with soap and water. Use paper towels to dry your hands.  Do not touch or rub your eyes.  Do not drive or use heavy machinery if your vision is blurry. Contact a doctor if:  You have a fever.  Your symptoms do not get better after 10 days. Get help right away if:  You have a fever and your symptoms suddenly get worse.  You have very bad pain when you move your eye.  Your face: ? Hurts. ? Is red. ? Is swollen.  You have sudden loss of vision. This information is not intended to replace advice given to you by your health care provider. Make sure you discuss any questions you have with your health care provider. Document Released: 11/11/2007 Document Revised: 07/10/2015 Document Reviewed: 11/14/2014 Elsevier Interactive Patient Education  2018 [employer] Inc.  

## 2016-11-02 ENCOUNTER — Encounter: Payer: Self-pay | Admitting: Family Medicine

## 2016-11-02 ENCOUNTER — Ambulatory Visit (INDEPENDENT_AMBULATORY_CARE_PROVIDER_SITE_OTHER): Payer: Medicare Other | Admitting: Family Medicine

## 2016-11-02 DIAGNOSIS — M170 Bilateral primary osteoarthritis of knee: Secondary | ICD-10-CM | POA: Diagnosis not present

## 2016-11-02 NOTE — Assessment & Plan Note (Signed)
Severe osteophytic changes bilaterally. Patient given another steroid injections with him being greater than 4 months since previous injections. Patient could be a candidate for viscous supplementation feeling all other conservative therapy in the long run. We discussed icing regimen, home exercises, which activities to do in which ones to avoid. Discussed over-the-counter medications. Follow-up again in 4 weeks

## 2016-11-02 NOTE — Progress Notes (Signed)
[name] [address] [place], [place] [address] Phone: [phone] Subjective:    I'm seeing this patient by the request  of:    CC: Bilateral knee pain  OZH:[id number]  Renee Young is a 71 y.o. female coming in with complaint of knee pain. She's been having knee pain for about 10 years. Has been seeing Dr. Margaretha Sheffield. Dr. Margaretha Sheffield said she couldn't get Cortizone shots anymore.  Patient continues to have significant amount of pain. Wasn't discussed the possibility of viscous supplementation. Patient is having instability of the knees. Sometimes stops her from daily activities. Sometimes associated with significant swelling. Rates the severity pain is 7 out of 10 most of the time.     Past Medical History:  Diagnosis Date  . ADD (attention deficit disorder)   . Allergy   . Anemia   . Arthritis   . Depression   . Diabetes mellitus without complication (HCC)   . Kidney stones   . UTI (urinary tract infection)    Past Surgical History:  Procedure Laterality Date  . CESAREAN SECTION    . TUBAL LIGATION    . URETEROCELE INCISION     Social History   Social History  . Marital status: Married    Spouse name: N/A  . Number of children: N/A  . Years of education: N/A   Social History Main Topics  . Smoking status: Never Smoker  . Smokeless tobacco: Never Used  . Alcohol use 1.2 oz/week    2 Glasses of wine per week  . Drug use: No  . Sexual activity: Yes   Other Topics Concern  . None   Social History Narrative   Married   Retired Financial trader      Allergies  Allergen Reactions  . Nitrofurantoin   . Penicillins    Family History  Problem Relation Age of Onset  . Cancer Mother   . Kidney disease Mother   . Diabetes Father   . Heart disease Father   . Hyperlipidemia Father   . Hypertension Father   . Stroke Father   . Arthritis Sister   . Obesity Brother   . Arthritis Brother   . Heart disease Maternal Grandmother     . Stroke Maternal Grandmother   . Hypertension Maternal Grandmother   . Heart disease Maternal Grandfather   . Hyperlipidemia Maternal Grandfather   . Stroke Paternal Grandmother   . Heart disease Paternal Grandmother   . Heart disease Paternal Grandfather   . Heart attack Paternal Grandfather   . Arthritis Daughter      Past medical history, social, surgical and family history all reviewed in electronic medical record.  No pertanent information unless stated regarding to the chief complaint.   Review of Systems:Review of systems updated and as accurate as of 11/02/16  No headache, visual changes, nausea, vomiting, diarrhea, constipation, dizziness, abdominal pain, skin rash, fevers, chills, night sweats, weight loss, swollen lymph nodes, body aches, joint swelling, chest pain, shortness of breath, mood changes. Positive muscle aches  Objective  Blood pressure 130/70, pulse (!) 102, height  (1.626 m), weight 178 lb (80.7 kg), SpO2 97 %. Systems examined below as of 11/02/16   General: No apparent distress alert and oriented x3 mood and affect normal, dressed appropriately.  HEENT: Pupils equal, extraocular movements intact  Respiratory: Patient's speak in full sentences and does not appear short of breath  Cardiovascular: No lower extremity edema, non tender, no  erythema  Skin: Warm dry intact with no signs of infection or rash on extremities or on axial skeleton.  Abdomen: Soft nontender  Neuro: Cranial nerves II through XII are intact, neurovascularly intact in all extremities with 2+ DTRs and 2+ pulses.  Lymph: No lymphadenopathy of posterior or anterior cervical chain or axillae bilaterally.  Gait Mild antalgic gait MSK:  Non tender with full range of motion and good stability and symmetric strength and tone of shoulders, elbows, wrist, hip, and ankles bilaterally.  Knee: Bilateral valgus deformity noted. Large thigh to calf ratio.  Tender to palpation over medial and PF  joint line.  ROM full in flexion and extension and lower leg rotation. instability with valgus force.  painful patellar compression. Patellar glide with moderate crepitus. Patellar and quadriceps tendons unremarkable. Hamstring and quadriceps strength is normal.   After informed written and verbal consent, patient was seated on exam table. Right knee was prepped with alcohol swab and utilizing anterolateral approach, patient's right knee space was injected with 4:1  marcaine 0.5%: Kenalog /dL. Patient tolerated the procedure well without immediate complications.  After informed written and verbal consent, patient was seated on exam table. Left knee was prepped with alcohol swab and utilizing anterolateral approach, patient's left knee space was injected with 4:1  marcaine 0.5%: Kenalog /dL. Patient tolerated the procedure well without immediate complications.     Impression and Recommendations:     This case required medical decision making of moderate complexity.      Note: This dictation was prepared with Dragon dictation along with smaller phrase technology. Any transcriptional errors that result from this process are unintentional.

## 2016-11-02 NOTE — Patient Instructions (Addendum)
Good to see you  Ice 20 minutes 2 times daily. Usually after activity and before bed. Over the counter try  Vitamin D 2000 IU daily  Turmeric  daily  Tart cherry extract any dose at night  See me again in 3 weeks and if not better we will start orthovisc

## 2016-11-04 NOTE — Progress Notes (Signed)
PRIMARY CARE AT [facility] [address] [place] [address] [phone]  Date:  11/01/2016   Name:  Renee Young   DOB:  1945/11/03   MRN:  [id number]  PCP:  [name], MD    History of Present Illness:  Renee Young is a 71 y.o. female patient who presents to PCP with  Chief Complaint  Patient presents with  . Conjunctivitis    possible pink eye/  left eye, x 2 days     2 days of agitation to the left eye.  At first felt like foreign body in the eye.  This dissipated but is pruritc.  No pain with eye movement.  No vision changes.  Yesterday, she noticed the right eye has started to feel agitated.  No apparent congestion, sore throat, or ear fullness.  No interaction with debris.    Patient Active Problem List   Diagnosis Date Noted  . Varicose veins of lower extremities with other complications 07/03/2012  . Pain in limb 07/03/2012  . DJD (degenerative joint disease) of knee 03/21/2009  . HYPERLIPIDEMIA 07/19/2007  . DEPRESSION 07/19/2007  . ATTENTION DEFICIT DISORDER 07/19/2007    Past Medical History:  Diagnosis Date  . ADD (attention deficit disorder)   . Allergy   . Anemia   . Arthritis   . Depression   . Diabetes mellitus without complication (HCC)   . Kidney stones   . UTI (urinary tract infection)     Past Surgical History:  Procedure Laterality Date  . CESAREAN SECTION    . TUBAL LIGATION    . URETEROCELE INCISION      Social History  Substance Use Topics  . Smoking status: Never Smoker  . Smokeless tobacco: Never Used  . Alcohol use 1.2 oz/week    2 Glasses of wine per week    Family History  Problem Relation Age of Onset  . Cancer Mother   . Kidney disease Mother   . Diabetes Father   . Heart disease Father   . Hyperlipidemia Father   . Hypertension Father   . Stroke Father   . Arthritis Sister   . Obesity Brother   . Arthritis Brother   . Heart disease Maternal Grandmother   . Stroke Maternal Grandmother   . Hypertension  Maternal Grandmother   . Heart disease Maternal Grandfather   . Hyperlipidemia Maternal Grandfather   . Stroke Paternal Grandmother   . Heart disease Paternal Grandmother   . Heart disease Paternal Grandfather   . Heart attack Paternal Grandfather   . Arthritis Daughter     Allergies  Allergen Reactions  . Nitrofurantoin   . Penicillins     Medication list has been reviewed and updated.  Current Outpatient Prescriptions on File Prior to Visit  Medication Sig Dispense Refill  . amphetamine-dextroamphetamine (ADDERALL) 20 MG tablet Take 20 mg by mouth 2 (two) times daily.  0  . FLUoxetine (PROZAC) 20 MG capsule Take 20 mg by mouth 3 (three) times daily.     Marland Kitchen FLUZONE HIGH-DOSE 0.5 ML SUSY TO BE ADMINISTERED BY PHARMACIST FOR IMMUNIZATION  0  . triamcinolone (KENALOG) 0.025 % ointment Apply 1 application topically 2 (two) times daily. 30 g 0   No current facility-administered medications on file prior to visit.     ROS ROS otherwise unremarkable unless listed above.  Physical Examination: BP 132/79   Pulse 98   Temp 98.5 F (36.9 C) (Oral)   Resp 16   Ht   (1.626 m)   Wt 176 lb (79.8 kg)   SpO2 96%   BMI 30.21 kg/m  Ideal Body Weight: Weight in (lb) to have BMI = 25: 145.3  Physical Exam  Constitutional: She is oriented to person, place, and time. She appears well-developed and well-nourished. No distress.  HENT:  Head: Normocephalic and atraumatic.  Right Ear: External ear normal.  Left Ear: External ear normal.  Eyes: Pupils are equal, round, and reactive to light. Conjunctivae and EOM are normal.  Dry discharge along the rim of eyelid and medial canthus.   Left eye without corneal abrasion. Mild injection of the left eye.  Cardiovascular: Normal rate.   Pulmonary/Chest: Effort normal. No respiratory distress.  Neurological: She is alert and oriented to person, place, and time.  Skin: She is not diaphoretic.  Psychiatric: She has a normal mood and affect.  Her behavior is normal.     Assessment and Plan: Renee Young is a 71 y.o. female who is here today for  Chief Complaint  Patient presents with  . Conjunctivitis    possible pink eye/  left eye, x 2 days   Acute conjunctivitis of both eyes, unspecified acute conjunctivitis type - Plan: erythromycin (ROMYCIN) ophthalmic ointment  Trena Platt, PA-C [facility] 9/20/20187:56 AM

## 2016-11-13 ENCOUNTER — Other Ambulatory Visit: Payer: Self-pay | Admitting: Physician Assistant

## 2016-11-13 DIAGNOSIS — H1033 Unspecified acute conjunctivitis, bilateral: Secondary | ICD-10-CM

## 2016-11-23 ENCOUNTER — Ambulatory Visit (INDEPENDENT_AMBULATORY_CARE_PROVIDER_SITE_OTHER): Payer: Medicare Other | Admitting: Family Medicine

## 2016-11-23 ENCOUNTER — Encounter: Payer: Self-pay | Admitting: Family Medicine

## 2016-11-23 DIAGNOSIS — M170 Bilateral primary osteoarthritis of knee: Secondary | ICD-10-CM

## 2016-11-23 NOTE — Patient Instructions (Signed)
Good to see you  We started orthovisc today  First one not a lot of change is normal.  Ice is your friend Continue the vitamins Stay active See you again next week!

## 2016-11-23 NOTE — Progress Notes (Signed)
[facility] [address] [place], [place] [address] Phone: [phone] Subjective:     CC: Bilateral knee pain follow-up  OZH:[id number]  Renee Young is a 71 y.o. female coming in with complaint of bilateral knee pain. Found to have severe degenerative joint disease bilaterally. Given injections one month ago. Patient was to do conservative therapy including home exercises. Patient states Did have improvement initially for the first week or she is about 80% better and now unfortunate back to the same pain she was having. Affecting daily activities. Doing the home exercises and has done formal physical therapy in the past. Having some increasing instability. Once to remain active but is finding it difficult secondary to the pain     Past Medical History:  Diagnosis Date  . ADD (attention deficit disorder)   . Allergy   . Anemia   . Arthritis   . Depression   . Diabetes mellitus without complication (HCC)   . Kidney stones   . UTI (urinary tract infection)    Past Surgical History:  Procedure Laterality Date  . CESAREAN SECTION    . TUBAL LIGATION    . URETEROCELE INCISION     Social History   Social History  . Marital status: Married    Spouse name: N/A  . Number of children: N/A  . Years of education: N/A   Social History Main Topics  . Smoking status: Never Smoker  . Smokeless tobacco: Never Used  . Alcohol use 1.2 oz/week    2 Glasses of wine per week  . Drug use: No  . Sexual activity: Yes   Other Topics Concern  . None   Social History Narrative   Married   Retired Financial trader      Allergies  Allergen Reactions  . Nitrofurantoin   . Penicillins    Family History  Problem Relation Age of Onset  . Cancer Mother   . Kidney disease Mother   . Diabetes Father   . Heart disease Father   . Hyperlipidemia Father   . Hypertension Father   . Stroke Father   . Arthritis Sister   . Obesity Brother   . Arthritis  Brother   . Heart disease Maternal Grandmother   . Stroke Maternal Grandmother   . Hypertension Maternal Grandmother   . Heart disease Maternal Grandfather   . Hyperlipidemia Maternal Grandfather   . Stroke Paternal Grandmother   . Heart disease Paternal Grandmother   . Heart disease Paternal Grandfather   . Heart attack Paternal Grandfather   . Arthritis Daughter      Past medical history, social, surgical and family history all reviewed in electronic medical record.  No pertanent information unless stated regarding to the chief complaint.   Review of Systems:Review of systems updated and as accurate as of 11/23/16  No headache, visual changes, nausea, vomiting, diarrhea, constipation, dizziness, abdominal pain, skin rash, fevers, chills, night sweats, weight loss, swollen lymph nodes, body aches, joint swelling,  chest pain, shortness of breath, mood changes.  Positive muscle aches Objective  Blood pressure (!) 150/60, pulse 96, height  (1.626 m), weight 175 lb (79.4 kg), SpO2 97 %. Systems examined below as of 11/23/16   General: No apparent distress alert and oriented x3 mood and affect normal, dressed appropriately.  HEENT: Pupils equal, extraocular movements intact  Respiratory: Patient's speak in full sentences and does not appear short of breath  Cardiovascular: No lower extremity edema,  non tender, no erythema  Skin: Warm dry intact with no signs of infection or rash on extremities or on axial skeleton.  Abdomen: Soft nontender  Neuro: Cranial nerves II through XII are intact, neurovascularly intact in all extremities with 2+ DTRs and 2+ pulses.  Lymph: No lymphadenopathy of posterior or anterior cervical chain or axillae bilaterally.  Gait normal with good balance and coordination.  MSK:  Non tender with full range of motion and good stability and symmetric strength and tone of shoulders, elbows, wrist, hip and ankles bilaterally.  Knee: Bilateral  valgus deformity  noted. Large thigh to calf ratio.  Tender to palpation over medial and PF joint line.  ROM full in flexion and extension and lower leg rotation. instability with valgus force.  painful patellar compression. Patellar glide with moderate crepitus. Patellar and quadriceps tendons unremarkable. Hamstring and quadriceps strength is normal.   After informed written and verbal consent, patient was seated on exam table. Right knee was prepped with alcohol swab and utilizing anterolateral approach, patient's right knee space was injected with15 mg/2.5 mL of Orthovisc(sodium hyaluronate) in a prefilled syringe was injected easily into the knee through a 22-gauge needle..Patient tolerated the procedure well without immediate complications.  After informed written and verbal consent, patient was seated on exam table. Left knee was prepped with alcohol swab and utilizing anterolateral approach, patient's left knee space was injected with15 mg/2.5 mL of Orthovisc(sodium hyaluronate) in a prefilled syringe was injected easily into the knee through a 22-gauge needle..Patient tolerated the procedure well without immediate complications.   Impression and Recommendations:     This case required medical decision making of moderate complexity.      Note: This dictation was prepared with Dragon dictation along with smaller phrase technology. Any transcriptional errors that result from this process are unintentional.

## 2016-11-23 NOTE — Assessment & Plan Note (Signed)
Failed conservative therapy. Patient given bilateral Orthovisc injections today. Patient will start with icing regimen as well as continuing the home exercises. We'll see patient back again in 1 week for second in a series of 4 injections bilaterally.

## 2016-11-30 ENCOUNTER — Encounter: Payer: Self-pay | Admitting: Family Medicine

## 2016-11-30 ENCOUNTER — Ambulatory Visit (INDEPENDENT_AMBULATORY_CARE_PROVIDER_SITE_OTHER): Payer: Medicare Other | Admitting: Family Medicine

## 2016-11-30 DIAGNOSIS — M170 Bilateral primary osteoarthritis of knee: Secondary | ICD-10-CM

## 2016-11-30 NOTE — Progress Notes (Signed)
[facility] [address] [place], [place] [address] Phone: [phone] Subjective:    I'm seeing this patient by the request  of:    CC: Bilateral knee pain  OZH:[id number]  Renee Young is a 71 y.o. female coming in with complaint of bilateral knee pain. Seen 1 month ago and diagnosed with severe osteophytic changes of the knees bilaterally. Patient was to do conservative therapy and is failed everything including formal physical therapy in the past. Patient was having increasing instability and pain. Patient states Having significant pain overall. Patient states the first injections has not been extremely helpful.       Past Medical History:  Diagnosis Date  . ADD (attention deficit disorder)   . Allergy   . Anemia   . Arthritis   . Depression   . Diabetes mellitus without complication (HCC)   . Kidney stones   . UTI (urinary tract infection)    Past Surgical History:  Procedure Laterality Date  . CESAREAN SECTION    . TUBAL LIGATION    . URETEROCELE INCISION     Social History   Social History  . Marital status: Married    Spouse name: N/A  . Number of children: N/A  . Years of education: N/A   Social History Main Topics  . Smoking status: Never Smoker  . Smokeless tobacco: Never Used  . Alcohol use 1.2 oz/week    2 Glasses of wine per week  . Drug use: No  . Sexual activity: Yes   Other Topics Concern  . Not on file   Social History Narrative   Married   Retired Financial trader      Allergies  Allergen Reactions  . Nitrofurantoin   . Penicillins    Family History  Problem Relation Age of Onset  . Cancer Mother   . Kidney disease Mother   . Diabetes Father   . Heart disease Father   . Hyperlipidemia Father   . Hypertension Father   . Stroke Father   . Arthritis Sister   . Obesity Brother   . Arthritis Brother   . Heart disease Maternal Grandmother   . Stroke Maternal Grandmother   . Hypertension  Maternal Grandmother   . Heart disease Maternal Grandfather   . Hyperlipidemia Maternal Grandfather   . Stroke Paternal Grandmother   . Heart disease Paternal Grandmother   . Heart disease Paternal Grandfather   . Heart attack Paternal Grandfather   . Arthritis Daughter      Past medical history, social, surgical and family history all reviewed in electronic medical record.  No pertanent information unless stated regarding to the chief complaint.   Review of Systems:Review of systems updated and as accurate as of 11/30/16  No headache, visual changes, nausea, vomiting, diarrhea, constipation, dizziness, abdominal pain, skin rash, fevers, chills, night sweats, weight loss, swollen lymph nodes, body aches, joint swelling, muscle aches, chest pain, shortness of breath, mood changes.   Objective  There were no vitals taken for this visit. Systems examined below as of 11/30/16   General: No apparent distress alert and oriented x3 mood and affect normal, dressed appropriately.  HEENT: Pupils equal, extraocular movements intact  Respiratory: Patient's speak in full sentences and does not appear short of breath  Cardiovascular: No lower extremity edema, non tender, no erythema  Skin: Warm dry intact with no signs of infection or rash on extremities or on axial skeleton.  Abdomen: Soft nontender  Neuro: Cranial nerves II through XII are intact, neurovascularly intact in all extremities with 2+ DTRs and 2+ pulses.  Lymph: No lymphadenopathy of posterior or anterior cervical chain or axillae bilaterally.  Gait normal with good balance and coordination.  MSK:  Non tender with full range of motion and good stability and symmetric strength and tone of shoulders, elbows, wrist, hip, and ankles bilaterally.  Knee: Bilateral valgus deformity noted. Abnormal thigh to calf ratio.  Tender to palpation over medial and PF joint line.  ROM full in flexion and extension and lower leg rotation. instability  with valgus force.  painful patellar compression. Patellar glide with moderate crepitus. Patellar and quadriceps tendons unremarkable. Hamstring and quadriceps strength is normal.  After informed written and verbal consent, patient was seated on exam table. Right knee was prepped with alcohol swab and utilizing anterolateral approach, patient's right knee space was injected with15 mg/2.5 mL of Orthovisc(sodium hyaluronate) in a prefilled syringe was injected easily into the knee through a 22-gauge needle..Patient tolerated the procedure well without immediate complications.  After informed written and verbal consent, patient was seated on exam table. Left knee was prepped with alcohol swab and utilizing anterolateral approach, patient's left knee space was injected with15 mg/2.5 mL of Orthovisc(sodium hyaluronate) in a prefilled syringe was injected easily into the knee through a 22-gauge needle..Patient tolerated the procedure well without immediate complications.   Impression and Recommendations:     This case required medical decision making of moderate complexity.      Note: This dictation was prepared with Dragon dictation along with smaller phrase technology. Any transcriptional errors that result from this process are unintentional.

## 2016-11-30 NOTE — Assessment & Plan Note (Signed)
Failed conservative therapy. Started viscous supplementation. Continue conservative therapy otherwise in follow-up again for repeat injection in 1 week.

## 2016-11-30 NOTE — Patient Instructions (Signed)
Good ot see you  2 down 2 to go  See you next week!

## 2016-12-07 ENCOUNTER — Ambulatory Visit (INDEPENDENT_AMBULATORY_CARE_PROVIDER_SITE_OTHER): Payer: Medicare Other | Admitting: Family Medicine

## 2016-12-07 ENCOUNTER — Encounter: Payer: Self-pay | Admitting: Family Medicine

## 2016-12-07 DIAGNOSIS — M170 Bilateral primary osteoarthritis of knee: Secondary | ICD-10-CM | POA: Diagnosis not present

## 2016-12-07 NOTE — Assessment & Plan Note (Signed)
Bilateral injections given again today. Tolerated the procedure well. We discussed icing regimen and home exercises. Patient will follow-up with me again in 1 week for fourth and final injection.

## 2016-12-07 NOTE — Progress Notes (Signed)
Tawana ScaleZach Smith D.O. [facility] [address] [place], [place] [address] Phone: [phone] Subjective:     CC: Knee arthritis follow-up  YQM:[id number]:Subjective  Renee Young is a 71 y.o. female coming in for bilateral knee injections. She has not had an increase in her pain since the last injection. Patient is a happy with the results of far. She is here for third in a series of 4 injections of the knees.     Past Medical History:  Diagnosis Date  . ADD (attention deficit disorder)   . Allergy   . Anemia   . Arthritis   . Depression   . Diabetes mellitus without complication (HCC)   . Kidney stones   . UTI (urinary tract infection)    Past Surgical History:  Procedure Laterality Date  . CESAREAN SECTION    . TUBAL LIGATION    . URETEROCELE INCISION     Social History   Social History  . Marital status: Married    Spouse name: N/A  . Number of children: N/A  . Years of education: N/A   Social History Main Topics  . Smoking status: Never Smoker  . Smokeless tobacco: Never Used  . Alcohol use 1.2 oz/week    2 Glasses of wine per week  . Drug use: No  . Sexual activity: Yes   Other Topics Concern  . Not on file   Social History Narrative   Married   Retired Financial traderTeacher   Exercises      Allergies  Allergen Reactions  . Nitrofurantoin   . Penicillins    Family History  Problem Relation Age of Onset  . Cancer Mother   . Kidney disease Mother   . Diabetes Father   . Heart disease Father   . Hyperlipidemia Father   . Hypertension Father   . Stroke Father   . Arthritis Sister   . Obesity Brother   . Arthritis Brother   . Heart disease Maternal Grandmother   . Stroke Maternal Grandmother   . Hypertension Maternal Grandmother   . Heart disease Maternal Grandfather   . Hyperlipidemia Maternal Grandfather   . Stroke Paternal Grandmother   . Heart disease Paternal Grandmother   . Heart disease Paternal Grandfather   . Heart attack Paternal Grandfather    . Arthritis Daughter      Past medical history, social, surgical and family history all reviewed in electronic medical record.  No pertanent information unless stated regarding to the chief complaint.   Review of Systems:Review of systems updated and as accurate as of 12/07/16  No headache, visual changes, nausea, vomiting, diarrhea, constipation, dizziness, abdominal pain, skin rash, fevers, chills, night sweats, weight loss, swollen lymph nodes, body aches,, chest pain, shortness of breath, mood changes. Positive muscle aches and joint swelling  Objective  Blood pressure 120/80. Systems examined below as of 12/07/16   General: No apparent distress alert and oriented x3 mood and affect normal, dressed appropriately.  HEENT: Pupils equal, extraocular movements intact  Respiratory: Patient's speak in full sentences and does not appear short of breath  Cardiovascular: No lower extremity edema, non tender, no erythema  Skin: Warm dry intact with no signs of infection or rash on extremities or on axial skeleton.  Abdomen: Soft nontender  Neuro: Cranial nerves II through XII are intact, neurovascularly intact in all extremities with 2+ DTRs and 2+ pulses.  Lymph: No lymphadenopathy of posterior or anterior cervical chain or axillae bilaterally.  Gait Mild antalgic  gait MSK:  Non tender with full range of motion and good stability and symmetric strength and tone of shoulders, elbows, wrist, hip, and ankles bilaterally.  Knee: Bilateral valgus deformity noted. Large thigh to calf ratio.  Tender to palpation over medial and PF joint line.  ROM full in flexion and extension and lower leg rotation. instability with valgus force.  painful patellar compression. Patellar glide with moderate crepitus. Patellar and quadriceps tendons unremarkable. Hamstring and quadriceps strength is normal.  After informed written and verbal consent, patient was seated on exam table. Right knee was prepped with  alcohol swab and utilizing anterolateral approach, patient's right knee space was injected with15 mg/2.5 mL of Orthovisc(sodium hyaluronate) in a prefilled syringe was injected easily into the knee through a 22-gauge needle..Patient tolerated the procedure well without immediate complications.  After informed written and verbal consent, patient was seated on exam table. Left knee was prepped with alcohol swab and utilizing anterolateral approach, patient's left knee space was injected with15 mg/2.5 mL of Orthovisc(sodium hyaluronate) in a prefilled syringe was injected easily into the knee through a 22-gauge needle..Patient tolerated the procedure well without immediate complications.    Impression and Recommendations:     This case required medical decision making of moderate complexity.      Note: This dictation was prepared with Dragon dictation along with smaller phrase technology. Any transcriptional errors that result from this process are unintentional.

## 2016-12-13 ENCOUNTER — Ambulatory Visit (INDEPENDENT_AMBULATORY_CARE_PROVIDER_SITE_OTHER): Payer: Medicare Other | Admitting: Family Medicine

## 2016-12-13 ENCOUNTER — Encounter: Payer: Self-pay | Admitting: Family Medicine

## 2016-12-13 DIAGNOSIS — M170 Bilateral primary osteoarthritis of knee: Secondary | ICD-10-CM | POA: Diagnosis not present

## 2016-12-13 NOTE — Assessment & Plan Note (Signed)
Bilateral injections given again today. Hopefully this will be beneficial. Continue conservative therapy otherwise. Follow-up again in 4 weeks

## 2016-12-13 NOTE — Patient Instructions (Signed)
Good to see you  You are done.  Stay active See me again in 4 weeks.

## 2016-12-13 NOTE — Progress Notes (Signed)
Renee ScaleZach Carolyne Young D.O. [facility] [address] [place], [place] [address] Phone: [phone] Subjective:     CC: Knee arthritis follow-up  MVH:[id number]:Subjective  Renee Young is a 71 y.o. female coming in for bilateral knee injections. She has not had an increase in her pain since the last injection. Patient is a happy with the results of far. She is here for Fourth and final in a series of 4 injections of the knees.      Past Medical History:  Diagnosis Date  . ADD (attention deficit disorder)   . Allergy   . Anemia   . Arthritis   . Depression   . Diabetes mellitus without complication (HCC)   . Kidney stones   . UTI (urinary tract infection)    Past Surgical History:  Procedure Laterality Date  . CESAREAN SECTION    . TUBAL LIGATION    . URETEROCELE INCISION     Social History   Social History  . Marital status: Married    Spouse name: N/A  . Number of children: N/A  . Years of education: N/A   Social History Main Topics  . Smoking status: Never Smoker  . Smokeless tobacco: Never Used  . Alcohol use 1.2 oz/week    2 Glasses of wine per week  . Drug use: No  . Sexual activity: Yes   Other Topics Concern  . None   Social History Narrative   Married   Retired Financial traderTeacher   Exercises      Allergies  Allergen Reactions  . Nitrofurantoin   . Penicillins    Family History  Problem Relation Age of Onset  . Cancer Mother   . Kidney disease Mother   . Diabetes Father   . Heart disease Father   . Hyperlipidemia Father   . Hypertension Father   . Stroke Father   . Arthritis Sister   . Obesity Brother   . Arthritis Brother   . Heart disease Maternal Grandmother   . Stroke Maternal Grandmother   . Hypertension Maternal Grandmother   . Heart disease Maternal Grandfather   . Hyperlipidemia Maternal Grandfather   . Stroke Paternal Grandmother   . Heart disease Paternal Grandmother   . Heart disease Paternal Grandfather   . Heart attack Paternal  Grandfather   . Arthritis Daughter      Past medical history, social, surgical and family history all reviewed in electronic medical record.  No pertanent information unless stated regarding to the chief complaint.   Review of Systems:Review of systems updated and as accurate as of 12/13/16  No headache, visual changes, nausea, vomiting, diarrhea, constipation, dizziness, abdominal pain, skin rash, fevers, chills, night sweats, weight loss, swollen lymph nodes, body aches,, chest pain, shortness of breath, mood changes. Positive muscle aches and joint swelling  Objective  Blood pressure 110/62, pulse 92, height 5' 4.5" (1.638 m), weight 163 lb (73.9 kg), SpO2 95 %. Systems examined below as of 12/13/16   General: No apparent distress alert and oriented x3 mood and affect normal, dressed appropriately.  HEENT: Pupils equal, extraocular movements intact  Respiratory: Patient's speak in full sentences and does not appear short of breath  Cardiovascular: No lower extremity edema, non tender, no erythema  Skin: Warm dry intact with no signs of infection or rash on extremities or on axial skeleton.  Abdomen: Soft nontender  Neuro: Cranial nerves II through XII are intact, neurovascularly intact in all extremities with 2+ DTRs and 2+ pulses.  Lymph: No lymphadenopathy of posterior or anterior cervical chain or axillae bilaterally.  Gait Mild antalgic gait MSK:  Non tender with full range of motion and good stability and symmetric strength and tone of shoulders, elbows, wrist, hip, and ankles bilaterally.  Knee:Bilateral valgus deformity noted. Large thigh to calf ratio.  Tender to palpation over medial and PF joint line.  ROM full in flexion and extension and lower leg rotation. instability with valgus force.  painful patellar compression. Patellar glide with moderate crepitus. Patellar and quadriceps tendons unremarkable. Hamstring and quadriceps strength is normal.   After informed  written and verbal consent, patient was seated on exam table. Right knee was prepped with alcohol swab and utilizing anterolateral approach, patient's right knee space was injected with15 mg/2.5 mL of Orthovisc(sodium hyaluronate) in a prefilled syringe was injected easily into the knee through a 22-gauge needle..Patient tolerated the procedure well without immediate complications.  After informed written and verbal consent, patient was seated on exam table. Left knee was prepped with alcohol swab and utilizing anterolateral approach, patient's left knee space was injected with15 mg/2.5 mL of Orthovisc(sodium hyaluronate) in a prefilled syringe was injected easily into the knee through a 22-gauge needle..Patient tolerated the procedure well without immediate complications.   Impression and Recommendations:     This case required medical decision making of moderate complexity.      Note: This dictation was prepared with Dragon dictation along with smaller phrase technology. Any transcriptional errors that result from this process are unintentional.

## 2016-12-13 NOTE — Progress Notes (Signed)
Tawana ScaleZach Smith D.O. [facility] [address] [place], [place] [address] Phone: [phone]) [phone] Subjective:    I'm seeing this patient by the request  of:    CC:   BJY:[id number]:Subjective  Renee Young is a 71 y.o. female coming in for follow up for knee pain. Overall patient notes improvement with the series.   Onset-  Location Duration-  Character- Aggravating factors- Reliving factors-  Therapies tried-  Severity-     Past Medical History:  Diagnosis Date  . ADD (attention deficit disorder)   . Allergy   . Anemia   . Arthritis   . Depression   . Diabetes mellitus without complication (HCC)   . Kidney stones   . UTI (urinary tract infection)    Past Surgical History:  Procedure Laterality Date  . CESAREAN SECTION    . TUBAL LIGATION    . URETEROCELE INCISION     Social History   Social History  . Marital status: Married    Spouse name: N/A  . Number of children: N/A  . Years of education: N/A   Social History Main Topics  . Smoking status: Never Smoker  . Smokeless tobacco: Never Used  . Alcohol use 1.2 oz/week    2 Glasses of wine per week  . Drug use: No  . Sexual activity: Yes   Other Topics Concern  . Not on file   Social History Narrative   Married   Retired Financial traderTeacher   Exercises      Allergies  Allergen Reactions  . Nitrofurantoin   . Penicillins    Family History  Problem Relation Age of Onset  . Cancer Mother   . Kidney disease Mother   . Diabetes Father   . Heart disease Father   . Hyperlipidemia Father   . Hypertension Father   . Stroke Father   . Arthritis Sister   . Obesity Brother   . Arthritis Brother   . Heart disease Maternal Grandmother   . Stroke Maternal Grandmother   . Hypertension Maternal Grandmother   . Heart disease Maternal Grandfather   . Hyperlipidemia Maternal Grandfather   . Stroke Paternal Grandmother   . Heart disease Paternal Grandmother   . Heart disease Paternal Grandfather   . Heart attack  Paternal Grandfather   . Arthritis Daughter      Past medical history, social, surgical and family history all reviewed in electronic medical record.  No pertanent information unless stated regarding to the chief complaint.   Review of Systems:Review of systems updated and as accurate as of 12/13/16  No headache, visual changes, nausea, vomiting, diarrhea, constipation, dizziness, abdominal pain, skin rash, fevers, chills, night sweats, weight loss, swollen lymph nodes, body aches, joint swelling, muscle aches, chest pain, shortness of breath, mood changes.   Objective  There were no vitals taken for this visit. Systems examined below as of 12/13/16   General: No apparent distress alert and oriented x3 mood and affect normal, dressed appropriately.  HEENT: Pupils equal, extraocular movements intact  Respiratory: Patient's speak in full sentences and does not appear short of breath  Cardiovascular: No lower extremity edema, non tender, no erythema  Skin: Warm dry intact with no signs of infection or rash on extremities or on axial skeleton.  Abdomen: Soft nontender  Neuro: Cranial nerves II through XII are intact, neurovascularly intact in all extremities with 2+ DTRs and 2+ pulses.  Lymph: No lymphadenopathy of posterior or anterior cervical chain or axillae bilaterally.  Gait normal with good balance and coordination.  MSK:  Non tender with full range of motion and good stability and symmetric strength and tone of shoulders, elbows, wrist, hip, knee and ankles bilaterally.     Impression and Recommendations:     This case required medical decision making of moderate complexity.      Note: This dictation was prepared with Dragon dictation along with smaller phrase technology. Any transcriptional errors that result from this process are unintentional.

## 2016-12-14 ENCOUNTER — Ambulatory Visit: Payer: Medicare Other | Admitting: Family Medicine

## 2016-12-17 ENCOUNTER — Encounter: Payer: Self-pay | Admitting: Physician Assistant

## 2016-12-17 DIAGNOSIS — Z86010 Personal history of colonic polyps: Secondary | ICD-10-CM | POA: Insufficient documentation

## 2016-12-24 ENCOUNTER — Encounter: Payer: Self-pay | Admitting: Family Medicine

## 2016-12-24 ENCOUNTER — Ambulatory Visit (INDEPENDENT_AMBULATORY_CARE_PROVIDER_SITE_OTHER): Payer: Medicare Other | Admitting: Family Medicine

## 2016-12-24 VITALS — BP 140/84 | HR 80 | Temp 98.2°F | Resp 16 | Ht 64.5 in | Wt 180.4 lb

## 2016-12-24 DIAGNOSIS — R1319 Other dysphagia: Secondary | ICD-10-CM | POA: Diagnosis not present

## 2016-12-24 DIAGNOSIS — R296 Repeated falls: Secondary | ICD-10-CM | POA: Diagnosis not present

## 2016-12-24 DIAGNOSIS — Z23 Encounter for immunization: Secondary | ICD-10-CM

## 2016-12-24 DIAGNOSIS — H02729 Madarosis of unspecified eye, unspecified eyelid and periocular area: Secondary | ICD-10-CM

## 2016-12-24 DIAGNOSIS — F329 Major depressive disorder, single episode, unspecified: Secondary | ICD-10-CM | POA: Diagnosis not present

## 2016-12-24 DIAGNOSIS — R3129 Other microscopic hematuria: Secondary | ICD-10-CM | POA: Diagnosis not present

## 2016-12-24 DIAGNOSIS — R739 Hyperglycemia, unspecified: Secondary | ICD-10-CM

## 2016-12-24 DIAGNOSIS — R400 Somnolence: Secondary | ICD-10-CM | POA: Diagnosis not present

## 2016-12-24 DIAGNOSIS — R0683 Snoring: Secondary | ICD-10-CM | POA: Diagnosis not present

## 2016-12-24 DIAGNOSIS — F32A Depression, unspecified: Secondary | ICD-10-CM

## 2016-12-24 DIAGNOSIS — Z0000 Encounter for general adult medical examination without abnormal findings: Secondary | ICD-10-CM

## 2016-12-24 MED ORDER — ZOSTER VAC RECOMB ADJUVANTED 50 MCG/0.5ML IM SUSR: 0.5000 mL | Freq: Once | INTRAMUSCULAR | 1 refills | Status: AC

## 2016-12-24 NOTE — Progress Notes (Signed)
Subjective:  By signing my name below, I, [name], attest that this documentation has been prepared under the direction and in the presence of Wendie Agreste, MD Electronically Signed: Ladene Artist, ED Scribe 12/24/2016 at 11:15 AM.   Patient ID: Renee Young, female    DOB: 1945/06/04, 71 y.o.   MRN: [id number]  Chief Complaint  Patient presents with  . Annual Exam    patient would like flu and pneumonia vaccines   HPI Renee Young is a 71 y.o. female who presents to Primary Care at [facility] for an annual exam. H/o hyperlipidemia, depression, ADD.  Depression Takes Prozac.   Knee Pain Been followed by Dr. Hulan Saas with [facility] for bilateral knee pain. Had her fourth and final injection of Orthovisc of bilateral knees 10/29. Planned on recheck in 4 weeks.   DM Screening She has had borderline Pre-DM/DM with last A1C 6.5 in 12/2014.   Hematuria Noted on prior screening UA. She had testing most recently in 02/2015. Urine culture negative. 2-6 RBCs on micro. Was treated for UTI/pyelo in 01/2014. She saw an urologist and reports a pshx of ureterocele in her 29s. Denies masses/lumps, vaginal bleeding.   CA Screening Colonoscopy: 12/2013, repeat in 3-5 years Cervical CA screening: last pap smear was ~15 years ago; always normal Breast CA Screening: mammogram 10/2015, no evidence of malignancy. Plans to schedule after the holidays.  Immunizations Immunization History  Administered Date(s) Administered  . Influenza, Seasonal, Injecte, Preservative Fre 04/05/2012  . Influenza,inj,Quad PF,6+ Mos 01/07/2014, 01/06/2015  . Pneumococcal Conjugate-13 01/07/2014  . Pneumococcal Polysaccharide-23 04/27/2012  . Tdap 04/27/2012  . Zoster 10/03/2007  Flu: today Pt agrees to have Shingrix vaccine.   Fall Screening 4-5 falls in the past year x 3-4 years. States falls have been due to being out of balance. Falls always occur while gardening on loose soils or on uneven  ground. Does report falling onto concrete once while getting off of a bicycle that she had not riden before; she was wearing a helmet. Denies falls in her home. She has never met with a neurologist to assess her balance but met with PT for balance training several years ago.   Depression Screening Depression screen [facility] 2/9 12/24/2016 11/01/2016 03/18/2016 09/05/2015 09/02/2015  Decreased Interest 1 0 0 0 0  Down, Depressed, Hopeless 0 0 0 0 0  PHQ - 2 Score 1 0 0 0 0   Functional Status Survey: Is the patient deaf or have difficulty hearing?: Yes Does the patient have difficulty seeing, even when wearing glasses/contacts?: Yes(occasional) Does the patient have difficulty concentrating, remembering, or making decisions?: No Does the patient have difficulty walking or climbing stairs?: Yes(due to knee pain) Does the patient have difficulty dressing or bathing?: No Does the patient have difficulty doing errands alone such as visiting a doctor's office or shopping?: No  Mental Status Screening 6CIT Screen 12/24/2016  What Year? 0 points  What month? 0 points  What time? 0 points  Count back from 20 0 points  Months in reverse 0 points  Repeat phrase 2 points  Total Score 2    Visual Acuity Screening   Right eye Left eye Both eyes  Without correction:     With correction: 20/30 20/30 20/25  Vision: Last seen 3-4 weeks ago. States her eye doctor was unsure of her eyelashes.  Dentist: has not seen a dentist recently  Exercise: does gardening and walks for exercise   Advanced Directives Does  not have a living will or advanced directive.  Sleep Disturbances  Pt reports a h/o sleep apnea and snoring. She met with a sleep specialist ~20 years ago and was told she needs to loose 20 lbs. Also reports that she wakes up in the middle of the night and has difficulty going back to sleep. Pt does reports daytime somnolence but states Adderall helps with this which is prescribed by Chapman Moss at  [facility].   Trouble Swallowing Pt reports intermittent trouble swallowing x1 year. She reports painful swallowing at times but denies choking or difficulty finishing a meal. Reports a h/o heartburn but has never taken medication for this; states she simply modifies her diet.   Constipation Intermittent constipation. She has eaten prunes and has a really high fiber diet but admits to not drinking plenty water. Does have a family h/o IBS although she has never been diagnosed with IBS.   Rash Recurrent rash to the top of buttock x 1.5 year.  Eyelash Issues Pt reports having pink eye in August that was treated with antibiotic drops. She noticed that her eyelashes had "broken off" which she is unsure is attributed to old age or the drops. Denies hair lost in any other areas. Does report this happened while under increased stress since both her father and maternal aunt passed within a 24 hour period.  Patient Active Problem List   Diagnosis Date Noted  . History of colonic polyps 12/17/2016  . Varicose veins of lower extremities with other complications 37/05/8887  . Pain in limb 07/03/2012  . DJD (degenerative joint disease) of knee 03/21/2009  . HYPERLIPIDEMIA 07/19/2007  . DEPRESSION 07/19/2007  . ATTENTION DEFICIT DISORDER 07/19/2007   Past Medical History:  Diagnosis Date  . ADD (attention deficit disorder)   . Allergy   . Anemia   . Arthritis   . Depression   . Diabetes mellitus without complication ([place])   . Kidney stones   . UTI (urinary tract infection)    Past Surgical History:  Procedure Laterality Date  . CESAREAN SECTION    . TUBAL LIGATION    . URETEROCELE INCISION     Allergies  Allergen Reactions  . Nitrofurantoin   . Penicillins    Prior to Admission medications   Medication Sig Start Date End Date Taking? Authorizing Provider  amphetamine-dextroamphetamine (ADDERALL) 20 MG tablet Take 20 mg by mouth 2 (two) times daily. 03/06/16   [provider]  erythromycin [facility]) ophthalmic ointment Place 1 application into both eyes 4 (four) times daily. 11/01/16   Ivar Drape D, PA  FLUoxetine (PROZAC) 20 MG capsule Take 20 mg by mouth 3 (three) times daily.     [provider]  FLUZONE HIGH-DOSE 0.5 ML SUSY TO BE ADMINISTERED BY PHARMACIST FOR IMMUNIZATION 12/26/15   [provider]  triamcinolone (KENALOG) 0.025 % ointment Apply 1 application topically 2 (two) times daily. 09/02/15   Leonie Douglas, PA-C   Social History   Socioeconomic History  . Marital status: Married    Spouse name: Not on file  . Number of children: Not on file  . Years of education: Not on file  . Highest education level: Not on file  Social Needs  . Financial resource strain: Not on file  . Food insecurity - worry: Not on file  . Food insecurity - inability: Not on file  . Transportation needs - medical: Not on file  . Transportation needs - non-medical: Not on file  Occupational  History  . Not on file  Tobacco Use  . Smoking status: Never Smoker  . Smokeless tobacco: Never Used  Substance and Sexual Activity  . Alcohol use: Yes    Alcohol/week: 1.2 oz    Types: 2 Glasses of wine per week  . Drug use: No  . Sexual activity: Yes  Other Topics Concern  . Not on file  Social History Narrative   Married   Retired Programmer, applications   Review of [place]: Positive for trouble swallowing.   Eyes:       + Thinning eyelashes  Respiratory: Negative for choking.   Gastrointestinal: Positive for constipation.  Genitourinary: Negative for vaginal bleeding.  Musculoskeletal: Positive for arthralgias.  Skin: Positive for rash.  Psychiatric/Behavioral: Positive for sleep disturbance.      Objective:   Physical Exam  Constitutional: She is oriented to person, place, and time. She appears well-developed and well-nourished.  HENT:  Head: Normocephalic and atraumatic.  Right Ear: External ear normal.  Left  Ear: External ear normal.  Mouth/Throat: Oropharynx is clear and moist.  Eyes: Conjunctivae are normal. Pupils are equal, round, and reactive to light.  Some thin eyelashes, some missing, lower than higher. No significant erythema or crusting around the eyelids.  Neck: Normal range of motion. Neck supple. No thyromegaly present.  Cardiovascular: Normal rate, regular rhythm, normal heart sounds and intact distal pulses.  No murmur heard. Pulmonary/Chest: Effort normal and breath sounds normal. No respiratory distress. She has no wheezes.  Abdominal: Soft. Bowel sounds are normal. There is no tenderness.  Musculoskeletal: Normal range of motion. She exhibits no edema or tenderness.  Lymphadenopathy:    She has no cervical adenopathy.  Neurological: She is alert and oriented to person, place, and time. She displays a negative Romberg sign.  Able to stand quickly without difficulty. Heel to toe is normal. Finger to nose is normal.  Skin: Skin is warm and dry. Rash noted.  Few hyperpigmented areas at the cluneal cleft. No erythema. No blistering.  Psychiatric: She has a normal mood and affect. Her behavior is normal. Thought content normal.   Vitals:   12/24/16 1031  BP: 140/84  Pulse: 80  Resp: 16  Temp: 98.2 F (36.8 C)  TempSrc: Oral  SpO2: 98%  Weight: 180 lb 6.4 oz (81.8 kg)  Height: 5' 4.5" (1.638 m)      Assessment & Plan:    Renee Young is a 71 y.o. female Medicare annual wellness visit, subsequent   - anticipatory guidance as below in AVS, screening labs if needed. Health maintenance items as above in HPI discussed/recommended as applicable.   - no concerning responses on depression, fall, or functional status screening. Any positive responses noted as above. Advanced directives discussed as in CHL.   Snoring - Plan: Ambulatory referral to Sleep Studies Daytime somnolence - Plan: Ambulatory referral to Sleep Studies  - refer to sleep studies, then if psychologic cause  of sleep suspected, follow up to discuss further  Flu vaccine need - Plan: Flu Vaccine QUAD 36+ mos IM  Other dysphagia  - Initially try omeprazole daily for the next 1-2 weeks. If not improving within a few weeks, ENT or GI eval may be needed  Hematuria, microscopic - Plan: POCT Microscopic Urinalysis ([facility]), POCT urinalysis dipstick  - History of hematuria. Planned on urinalysis in office, but was not obtained. Can repeat urine test at follow-up visit in the next few weeks  Hypotrichosis of eyelid, unspecified  laterality - Plan: TSH  - Thinning of eyelids, without apparent other hair loss. Initially start with checking TSH, then consider dermatology eval.  Depression, unspecified depression type - Plan: TSH  - Followed by [facility] counseling. Continue Prozac, check TSH with above symptoms   Hyperglycemia - Plan: Comprehensive metabolic panel, Hemoglobin A1c  - Prior prediabetes, borderline diabetes. Check A1c.  Need for shingles vaccine - Plan: Zoster Vaccine Adjuvanted [facility]) injection  Multiple falls - Plan: Ambulatory referral to Neurology  - Nonfocal neurologic exam, concerning that she is having 4-5 falls per year, but this tended to be outside, with uneven ground.   -Physical therapy/balance training may be helpful, but will have her first evaluated with neurology to see if other testing needed  With other concerns addressed today, and chronic symptoms of rash on lower back, asked her to follow-up to discuss that and other concerns further.  Meds ordered this encounter  Medications  . Zoster Vaccine Adjuvanted [facility]) injection    Sig: Inject 0.5 mLs once for 1 dose into the muscle. Repeat in 2-6 months.    Dispense:  0.5 mL    Refill:  1   Patient Instructions   Thanks for coming in today.   Follow up for mammogram and colonoscopy as planned.   For sleep issue - I will refer you to sleep specialist.   For swallowing issue, try omeprazole (prilosec) once  per day and follow up to discuss symptoms further in next 2-3 weeks. If not resolving in next few weeks, then I would recommend gastroenterology evaluation or ENT eval.   Drink plenty of water, continue fiber in diet. Colace if needed. If not improving - return to discuss further.   For rash on buttocks, it does not appear infected at this time. Please follow up to discuss that area further   I will check urine test again today for blood in urine, but if that is still present, would recommend meeting with urologist to discuss if further testing needed.  I will check blood sugar and other tests today, including thyroid test. If no source on bloodwork, I can refer you to dermatology to discuss further.   I would like you to meet with neurology to discuss your balance and falls.   Return to the clinic or go to the nearest emergency room if any of your symptoms worsen or new symptoms occur.    Keeping You Healthy  Get These Tests  Blood Pressure- Have your blood pressure checked by your healthcare provider at least once a year.  Normal blood pressure is 120/80.  Weight- Have your body mass index (BMI) calculated to screen for obesity.  BMI is a measure of body fat based on height and weight.  You can calculate your own BMI at [employer].it  Cholesterol- Have your cholesterol checked every year.  Diabetes- Have your blood sugar checked every year if you have high blood pressure, high cholesterol, a family history of diabetes or if you are overweight.  Pap Test - Have a pap test every 1 to 5 years if you have been sexually active.  If you are older than 65 and recent pap tests have been normal you may not need additional pap tests.  In addition, if you have had a hysterectomy  for benign disease additional pap tests are not necessary.  Mammogram-Yearly mammograms are essential for early detection of breast Young  Screening for Colon Young- Colonoscopy starting at age 5.  Screening may begin sooner depending on  your family history and other health conditions.  Follow up colonoscopy as directed by your Gastroenterologist.  Screening for Osteoporosis- Screening begins at age 73 with bone density scanning, sooner if you are at higher risk for developing Osteoporosis.  Get these medicines  Calcium with Vitamin D- Your body requires 1200-1500 mg of Calcium a day and [phone] IU of Vitamin D a day.  You can only absorb 500 mg of Calcium at a time therefore Calcium must be taken in 2 or 3 separate doses throughout the day.  Hormones- Hormone therapy has been associated with increased risk for certain cancers and heart disease.  Talk to your healthcare provider about if you need relief from menopausal symptoms.  Aspirin- Ask your healthcare provider about taking Aspirin to prevent Heart Disease and Stroke.  Get these Immuniztions  Flu shot- Every fall  Pneumonia shot- Once after the age of 30; if you are younger ask your healthcare provider if you need a pneumonia shot.  Tetanus- Every ten years.  Zostavax- Once after the age of 55 to prevent shingles.  Take these steps  Don't smoke- Your healthcare provider can help you quit. For tips on how to quit, ask your healthcare provider or go to www.smokefree.gov or call 1-800 QUIT-NOW.  Be physically active- Exercise 5 days a week for a minimum of 30 minutes.  If you are not already physically active, start slow and gradually work up to 30 minutes of moderate physical activity.  Try walking, dancing, bike riding, swimming, etc.  Eat a healthy diet- Eat a variety of healthy foods such as fruits, vegetables, whole grains, low fat milk, low fat cheeses, yogurt, lean meats, chicken, fish, eggs, dried beans, tofu, etc.  For more information go to www.thenutritionsource.org  Dental visit- Brush and floss teeth twice daily; visit your dentist twice a year.  Eye exam- Visit your Optometrist or Ophthalmologist  yearly.  Drink alcohol in moderation- Limit alcohol intake to one drink or less a day.  Never drink and drive.  Depression- Your emotional health is as important as your physical health.  If you're feeling down or losing interest in things you normally enjoy, please talk to your healthcare provider.  Seat Belts- can save your life; always wear one  Smoke/Carbon Monoxide detectors- These detectors need to be installed on the appropriate level of your home.  Replace batteries at least once a year.  Violence- If anyone is threatening or hurting you, please tell your healthcare provider.  Living Will/ Health care power of attorney- Discuss with your healthcare provider and family.  Constipation, Adult Constipation is when a person has fewer bowel movements in a week than normal, has difficulty having a bowel movement, or has stools that are dry, hard, or larger than normal. Constipation may be caused by an underlying condition. It may become worse with age if a person takes certain medicines and does not take in enough fluids. Follow these instructions at home: Eating and drinking   Eat foods that have a lot of fiber, such as fresh fruits and vegetables, whole grains, and beans.  Limit foods that are high in fat, low in fiber, or overly processed, such as french fries, hamburgers, cookies, candies, and soda.  Drink enough fluid to keep your urine clear or pale yellow. General instructions  Exercise regularly or as told by your health care provider.  Go to the restroom when you have the urge to go. Do not hold it in.  Take over-the-counter and prescription medicines  only as told by your health care provider. These include any fiber supplements.  Practice pelvic floor retraining exercises, such as deep breathing while relaxing the lower abdomen and pelvic floor relaxation during bowel movements.  Watch your condition for any changes.  Keep all follow-up visits as told by your health  care provider. This is important. Contact a health care provider if:  You have pain that gets worse.  You have a fever.  You do not have a bowel movement after 4 days.  You vomit.  You are not hungry.  You lose weight.  You are bleeding from the anus.  You have thin, pencil-like stools. Get help right away if:  You have a fever and your symptoms suddenly get worse.  You leak stool or have blood in your stool.  Your abdomen is bloated.  You have severe pain in your abdomen.  You feel dizzy or you faint. This information is not intended to replace advice given to you by your health care provider. Make sure you discuss any questions you have with your health care provider. Document Released: 10/31/2003 Document Revised: 08/22/2015 Document Reviewed: 07/23/2015 Elsevier Interactive Patient Education  2017 [employer].    IF you received an x-ray today, you will receive an invoice from [facility]. Please contact [facility] at [phone] with questions or concerns regarding your invoice.   IF you received labwork today, you will receive an invoice from [facility]. Please contact [facility] at [phone] with questions or concerns regarding your invoice.   Our billing staff will not be able to assist you with questions regarding bills from these companies.  You will be contacted with the lab results as soon as they are available. The fastest way to get your results is to activate your My Chart account. Instructions are located on the last page of this paperwork. If you have not heard from [place] regarding the results in 2 weeks, please contact this office.      I personally performed the services described in this documentation, which was scribed in my presence. The recorded information has been reviewed and considered for accuracy and completeness, addended by me as needed, and agree with information above.  Signed,   Merri Ray, MD Primary Care at  [place].  12/25/16 8:15 AM

## 2016-12-24 NOTE — Patient Instructions (Addendum)
Thanks for coming in today.   Follow up for mammogram and colonoscopy as planned.   For sleep issue - I will refer you to sleep specialist.   For swallowing issue, try omeprazole (prilosec) once per day and follow up to discuss symptoms further in next 2-3 weeks. If not resolving in next few weeks, then I would recommend gastroenterology evaluation or ENT eval.   Drink plenty of water, continue fiber in diet. Colace if needed. If not improving - return to discuss further.   For rash on buttocks, it does not appear infected at this time. Please follow up to discuss that area further   I will check urine test again today for blood in urine, but if that is still present, would recommend meeting with urologist to discuss if further testing needed.  I will check blood sugar and other tests today, including thyroid test. If no source on bloodwork, I can refer you to dermatology to discuss further.   I would like you to meet with neurology to discuss your balance and falls.   Return to the clinic or go to the nearest emergency room if any of your symptoms worsen or new symptoms occur.    Keeping You Healthy  Get These Tests  Blood Pressure- Have your blood pressure checked by your healthcare provider at least once a year.  Normal blood pressure is 120/80.  Weight- Have your body mass index (BMI) calculated to screen for obesity.  BMI is a measure of body fat based on height and weight.  You can calculate your own BMI at https://www.west-esparza.com/www.nhlbisupport.com/bmi/  Cholesterol- Have your cholesterol checked every year.  Diabetes- Have your blood sugar checked every year if you have high blood pressure, high cholesterol, a family history of diabetes or if you are overweight.  Pap Test - Have a pap test every 1 to 5 years if you have been sexually active.  If you are older than 65 and recent pap tests have been normal you may not need additional pap tests.  In addition, if you have had a hysterectomy  for  benign disease additional pap tests are not necessary.  Mammogram-Yearly mammograms are essential for early detection of breast cancer  Screening for Colon Cancer- Colonoscopy starting at age 70. Screening may begin sooner depending on your family history and other health conditions.  Follow up colonoscopy as directed by your Gastroenterologist.  Screening for Osteoporosis- Screening begins at age 70 with bone density scanning, sooner if you are at higher risk for developing Osteoporosis.  Get these medicines  Calcium with Vitamin D- Your body requires 1200-1500 mg of Calcium a day and [phone] IU of Vitamin D a day.  You can only absorb 500 mg of Calcium at a time therefore Calcium must be taken in 2 or 3 separate doses throughout the day.  Hormones- Hormone therapy has been associated with increased risk for certain cancers and heart disease.  Talk to your healthcare provider about if you need relief from menopausal symptoms.  Aspirin- Ask your healthcare provider about taking Aspirin to prevent Heart Disease and Stroke.  Get these Immuniztions  Flu shot- Every fall  Pneumonia shot- Once after the age of 70; if you are younger ask your healthcare provider if you need a pneumonia shot.  Tetanus- Every ten years.  Zostavax- Once after the age of 70 to prevent shingles.  Take these steps  Don't smoke- Your healthcare provider can help you quit. For tips on how to quit, ask  your healthcare provider or go to www.smokefree.gov or call 1-800 QUIT-NOW.  Be physically active- Exercise 5 days a week for a minimum of 30 minutes.  If you are not already physically active, start slow and gradually work up to 30 minutes of moderate physical activity.  Try walking, dancing, bike riding, swimming, etc.  Eat a healthy diet- Eat a variety of healthy foods such as fruits, vegetables, whole grains, low fat milk, low fat cheeses, yogurt, lean meats, chicken, fish, eggs, dried beans, tofu, etc.  For more  information go to www.thenutritionsource.org  Dental visit- Brush and floss teeth twice daily; visit your dentist twice a year.  Eye exam- Visit your Optometrist or Ophthalmologist yearly.  Drink alcohol in moderation- Limit alcohol intake to one drink or less a day.  Never drink and drive.  Depression- Your emotional health is as important as your physical health.  If you're feeling down or losing interest in things you normally enjoy, please talk to your healthcare provider.  Seat Belts- can save your life; always wear one  Smoke/Carbon Monoxide detectors- These detectors need to be installed on the appropriate level of your home.  Replace batteries at least once a year.  Violence- If anyone is threatening or hurting you, please tell your healthcare provider.  Living Will/ Health care power of attorney- Discuss with your healthcare provider and family.  Constipation, Adult Constipation is when a person has fewer bowel movements in a week than normal, has difficulty having a bowel movement, or has stools that are dry, hard, or larger than normal. Constipation may be caused by an underlying condition. It may become worse with age if a person takes certain medicines and does not take in enough fluids. Follow these instructions at home: Eating and drinking   Eat foods that have a lot of fiber, such as fresh fruits and vegetables, whole grains, and beans.  Limit foods that are high in fat, low in fiber, or overly processed, such as french fries, hamburgers, cookies, candies, and soda.  Drink enough fluid to keep your urine clear or pale yellow. General instructions  Exercise regularly or as told by your health care provider.  Go to the restroom when you have the urge to go. Do not hold it in.  Take over-the-counter and prescription medicines only as told by your health care provider. These include any fiber supplements.  Practice pelvic floor retraining exercises, such as deep  breathing while relaxing the lower abdomen and pelvic floor relaxation during bowel movements.  Watch your condition for any changes.  Keep all follow-up visits as told by your health care provider. This is important. Contact a health care provider if:  You have pain that gets worse.  You have a fever.  You do not have a bowel movement after 4 days.  You vomit.  You are not hungry.  You lose weight.  You are bleeding from the anus.  You have thin, pencil-like stools. Get help right away if:  You have a fever and your symptoms suddenly get worse.  You leak stool or have blood in your stool.  Your abdomen is bloated.  You have severe pain in your abdomen.  You feel dizzy or you faint. This information is not intended to replace advice given to you by your health care provider. Make sure you discuss any questions you have with your health care provider. Document Released: 10/31/2003 Document Revised: 08/22/2015 Document Reviewed: 07/23/2015 Elsevier Interactive Patient Education  2017 [employer]  IF you received an x-ray today, you will receive an invoice from [facility]. Please contact [facility] at [phone] with questions or concerns regarding your invoice.   IF you received labwork today, you will receive an invoice from [facility]. Please contact [facility] at [phone] with questions or concerns regarding your invoice.   Our billing staff will not be able to assist you with questions regarding bills from these companies.  You will be contacted with the lab results as soon as they are available. The fastest way to get your results is to activate your My Chart account. Instructions are located on the last page of this paperwork. If you have not heard from us regarding the results in 2 weeks, please contact this office.     

## 2016-12-25 LAB — COMPREHENSIVE METABOLIC PANEL
A/G RATIO: 1.4 (ref 1.2–2.2)
ALT: 19 IU/L (ref 0–32)
AST: 21 IU/L (ref 0–40)
Albumin: 4.2 g/dL (ref 3.5–4.8)
Alkaline Phosphatase: 91 IU/L (ref 39–117)
BILIRUBIN TOTAL: 0.2 mg/dL (ref 0.0–1.2)
BUN/Creatinine Ratio: 25 (ref 12–28)
BUN: 19 mg/dL (ref 8–27)
CALCIUM: 9.5 mg/dL (ref 8.7–10.3)
CHLORIDE: 101 mmol/L (ref 96–106)
CO2: 26 mmol/L (ref 20–29)
Creatinine, Ser: 0.75 mg/dL (ref 0.57–1.00)
GFR calc Af Amer: 93 mL/min/{1.73_m2} (ref >59)
GFR, EST NON AFRICAN AMERICAN: 81 mL/min/{1.73_m2} (ref >59)
GLOBULIN, TOTAL: 3 g/dL (ref 1.5–4.5)
Glucose: 99 mg/dL (ref 65–99)
POTASSIUM: 4.6 mmol/L (ref 3.5–5.2)
SODIUM: 139 mmol/L (ref 134–144)
Total Protein: 7.2 g/dL (ref 6.0–8.5)

## 2016-12-25 LAB — HEMOGLOBIN A1C
Est. average glucose Bld gHb Est-mCnc: 146 mg/dL
HEMOGLOBIN A1C: 6.7 % — AB (ref 4.8–5.6)

## 2016-12-25 LAB — TSH: TSH: 3.16 u[IU]/mL (ref 0.450–4.500)

## 2017-01-05 ENCOUNTER — Other Ambulatory Visit: Payer: Self-pay | Admitting: Family Medicine

## 2017-01-05 DIAGNOSIS — Z1231 Encounter for screening mammogram for malignant neoplasm of breast: Secondary | ICD-10-CM

## 2017-01-11 ENCOUNTER — Other Ambulatory Visit: Payer: Self-pay

## 2017-01-11 ENCOUNTER — Ambulatory Visit: Payer: Medicare Other | Admitting: Family Medicine

## 2017-01-11 ENCOUNTER — Encounter: Payer: Self-pay | Admitting: Family Medicine

## 2017-01-11 VITALS — BP 130/60 | HR 108 | Temp 98.4°F | Resp 17 | Ht 64.5 in | Wt 181.6 lb

## 2017-01-11 DIAGNOSIS — R21 Rash and other nonspecific skin eruption: Secondary | ICD-10-CM | POA: Diagnosis not present

## 2017-01-11 DIAGNOSIS — L609 Nail disorder, unspecified: Secondary | ICD-10-CM

## 2017-01-11 DIAGNOSIS — L659 Nonscarring hair loss, unspecified: Secondary | ICD-10-CM | POA: Diagnosis not present

## 2017-01-11 DIAGNOSIS — R319 Hematuria, unspecified: Secondary | ICD-10-CM | POA: Diagnosis not present

## 2017-01-11 LAB — POCT URINALYSIS DIP (MANUAL ENTRY)
Bilirubin, UA: NEGATIVE
Glucose, UA: NEGATIVE mg/dL
Ketones, POC UA: NEGATIVE mg/dL
LEUKOCYTES UA: NEGATIVE
NITRITE UA: NEGATIVE
PH UA: 6.5 (ref 5.0–8.0)
PROTEIN UA: NEGATIVE mg/dL
Spec Grav, UA: 1.015 (ref 1.010–1.025)
UROBILINOGEN UA: 0.2 U/dL

## 2017-01-11 LAB — POC MICROSCOPIC URINALYSIS (UMFC): MUCUS RE: ABSENT

## 2017-01-11 MED ORDER — CLOTRIMAZOLE-BETAMETHASONE 1-0.05 % EX CREA: 1.0000 "application " | TOPICAL_CREAM | Freq: Two times a day (BID) | CUTANEOUS | 0 refills | Status: DC

## 2017-01-11 NOTE — Patient Instructions (Addendum)
I will refer you to dermatologist to evaluate the area on your back as well as thinning eyebrows, and nail changes.  Psoriasis is possible based on the description of your symptoms.  Could consider rheumatology evaluation, especially if you have persistent knee pain, but will start with dermatology evaluation first.  Lotrisone cream could be used to the area on the upper buttocks, as that could be due to fungus as well as inflammation. However if you want to wait until seen by dermatology to start treatment, that is also okay.  If any further difficulty with swallowing, I would recommend evaluation with Ear nose and throat specialist. Let me know if that occurs.   Urine test did show blood again today. I will refer you to urology to determine if other testing is needed. If any burning with urination, abdominal pain or new symptoms, please return to discuss further.   Keep follow up with neurology as planned.     Hematuria, Adult Hematuria is blood in your urine. It can be caused by a bladder infection, kidney infection, prostate infection, kidney stone, or cancer of your urinary tract. Infections can usually be treated with medicine, and a kidney stone usually will pass through your urine. If neither of these is the cause of your hematuria, further workup to find out the reason may be needed. It is very important that you tell your health care provider about any blood you see in your urine, even if the blood stops without treatment or happens without causing pain. Blood in your urine that happens and then stops and then happens again can be a symptom of a very serious condition. Also, pain is not a symptom in the initial stages of many urinary cancers. Follow these instructions at home:  Drink lots of fluid, 3-4 quarts a day. If you have been diagnosed with an infection, cranberry juice is especially recommended, in addition to large amounts of water.  Avoid caffeine, tea, and carbonated beverages  because they tend to irritate the bladder.  Avoid alcohol because it may irritate the prostate.  Take all medicines as directed by your health care provider.  If you were prescribed an antibiotic medicine, finish it all even if you start to feel better.  If you have been diagnosed with a kidney stone, follow your health care provider's instructions regarding straining your urine to catch the stone.  Empty your bladder often. Avoid holding urine for long periods of time.  After a bowel movement, women should cleanse front to back. Use each tissue only once.  Empty your bladder before and after sexual intercourse if you are a female. Contact a health care provider if:  You develop back pain.  You have a fever.  You have a feeling of sickness in your stomach (nausea) or vomiting.  Your symptoms are not better in 3 days. Return sooner if you are getting worse. Get help right away if:  You develop severe vomiting and are unable to keep the medicine down.  You develop severe back or abdominal pain despite taking your medicines.  You begin passing a large amount of blood or clots in your urine.  You feel extremely weak or faint, or you pass out. This information is not intended to replace advice given to you by your health care provider. Make sure you discuss any questions you have with your health care provider. Document Released: 02/01/2005 Document Revised: 07/10/2015 Document Reviewed: 10/02/2012 Elsevier Interactive Patient Education  2017 [employer]   IF  you received an x-ray today, you will receive an invoice from [facility]. Please contact [facility] at [phone] with questions or concerns regarding your invoice.   IF you received labwork today, you will receive an invoice from [facility]. Please contact [facility] at [phone] with questions or concerns regarding your invoice.   Our billing staff will not be able to assist you with questions  regarding bills from these companies.  You will be contacted with the lab results as soon as they are available. The fastest way to get your results is to activate your My Chart account. Instructions are located on the last page of this paperwork. If you have not heard from us regarding the results in 2 weeks, please contact this office.

## 2017-01-11 NOTE — Progress Notes (Signed)
hySj

## 2017-01-11 NOTE — Progress Notes (Signed)
Subjective:  By signing my name below, I, [name], attest that this documentation has been prepared under the direction and in the presence of [name], MD Electronically Signed: Charline Bills, ED Scribe 01/11/2017 at 2:50 PM.   Patient ID: Renee Young, female    DOB: September 25, 1945, 71 y.o.   MRN: [id number]  Chief Complaint  Patient presents with  . recheck urine   HPI Renee Young is a 71 y.o. female who presents to Primary Care at [facility] for follow-up from medicare wellness exam. Multiple other issues discussed at that time.   Dysphagia See last visit. Recommended daily omeprazole, then ENT or GI eval if not improving. Pt states that she is able to swallow food and water without difficulty. States dysphagia has improved with competing 2 week coarse of omeprazole.  Hematuria Planned on repeat urine testing today. Noted on pervious screening UA. See details last visit. Denies difficulty urinating, hematuria. Pt has not seen an urologist in over 35 years.   Thinning of Eyelashes TSH obtained last visit, normal. Consider derm eval.   H/o Falls Referred to neuro last visit, appointment schedule for 12/19. Also referred to sleep specialist due to snoring and daytime somnolence.   Rash  Pt reports recurrent rash to the top of the buttock since 03/2015. Tried antifungal cream for 1 week without relief. Pt states rash becomes more irritated in the summer. She also reports outbreaks in scalp and thickening of toenails. Pt was seen by Dr. Milus Glazier with concern of pitting of fingernails and joint pain on eval in 04/2015. Reports she has had pink and silver patches, none present at this time. Still having knee pain at this time that she attributes to change in weather. Pt's father had a h/o psoriasis and brother has a h/o arthritis. She has not seen a dermatologist.  Patient Active Problem List   Diagnosis Date Noted  . History of colonic polyps 12/17/2016  . Varicose veins of  lower extremities with other complications 07/03/2012  . Pain in limb 07/03/2012  . DJD (degenerative joint disease) of knee 03/21/2009  . HYPERLIPIDEMIA 07/19/2007  . DEPRESSION 07/19/2007  . ATTENTION DEFICIT DISORDER 07/19/2007   Past Medical History:  Diagnosis Date  . ADD (attention deficit disorder)   . Allergy   . Anemia   . Arthritis   . Depression   . Diabetes mellitus without complication (HCC)   . Kidney stones   . UTI (urinary tract infection)    Past Surgical History:  Procedure Laterality Date  . CESAREAN SECTION    . TUBAL LIGATION    . URETEROCELE INCISION     Allergies  Allergen Reactions  . Nitrofurantoin   . Penicillins    Prior to Admission medications   Medication Sig Start Date End Date Taking? Authorizing Provider  amphetamine-dextroamphetamine (ADDERALL) 20 MG tablet Take 20 mg by mouth 2 (two) times daily. 03/06/16  Yes [provider]  cholecalciferol (VITAMIN D) 1000 units tablet Take 1,000 Units by mouth daily.   Yes [provider]  FLUoxetine (PROZAC) 20 MG capsule Take 20 mg by mouth 3 (three) times daily.    Yes [provider]  omeprazole (PRILOSEC) 20 MG capsule Take 20 mg by mouth daily.   Yes [provider]  Turmeric 500 MG TABS Take by mouth.   Yes [provider]  FLUZONE HIGH-DOSE 0.5 ML SUSY TO BE ADMINISTERED BY PHARMACIST FOR IMMUNIZATION 12/26/15   [provider]   Social  History   Socioeconomic History  . Marital status: Married    Spouse name: Not on file  . Number of children: Not on file  . Years of education: Not on file  . Highest education level: Not on file  Social Needs  . Financial resource strain: Not on file  . Food insecurity - worry: Not on file  . Food insecurity - inability: Not on file  . Transportation needs - medical: Not on file  . Transportation needs - non-medical: Not on file  Occupational History  . Not on file  Tobacco Use  . Smoking status:  Never Smoker  . Smokeless tobacco: Never Used  Substance and Sexual Activity  . Alcohol use: Yes    Alcohol/week: 1.2 oz    Types: 2 Glasses of wine per week  . Drug use: No  . Sexual activity: Yes  Other Topics Concern  . Not on file  Social History Narrative   Married   Retired Financial traderTeacher   Exercises   Review of Systems  HENT: Negative for trouble swallowing.   Genitourinary: Negative for difficulty urinating and hematuria.  Musculoskeletal: Positive for arthralgias.      Objective:   Physical Exam  Constitutional: She is oriented to person, place, and time. She appears well-developed and well-nourished. No distress.  HENT:  Head: Normocephalic and atraumatic.  Eyes: Conjunctivae and EOM are normal.  Neck: Neck supple. No tracheal deviation present.  Cardiovascular: Normal rate.  Pulmonary/Chest: Effort normal. No respiratory distress.  Musculoskeletal: Normal range of motion.  Neurological: She is alert and oriented to person, place, and time.  Skin: Skin is warm and dry.  Hyperpigmentation at the upper cluneal cleft. Does not extend towards anus. Few surrounding rounded patches. Area is slightly thickened. Rash approximately 5 x 2.5 cm. No surrounding erythema or discharge. Slight ridging and pits on nails of fingers and vertical banding of toenails. Thickened great toenail on L vs R. No surrounding rash.  Psychiatric: She has a normal mood and affect. Her behavior is normal.  Nursing note and vitals reviewed.  Vitals:   01/11/17 1425  BP: 130/60  Pulse: (!) 108  Resp: 17  Temp: 98.4 F (36.9 C)  SpO2: 96%  Weight: 181 lb 9.6 oz (82.4 kg)  Height: 5' 4.5" (1.638 m)   Results for orders placed or performed in visit on 01/11/17  POCT urinalysis dipstick  Result Value Ref Range   Color, UA yellow yellow   Clarity, UA clear clear   Glucose, UA negative negative mg/dL   Bilirubin, UA negative negative   Ketones, POC UA negative negative mg/dL   Spec Grav, UA [phone]  1.010 - 1.025   Blood, UA moderate (A) negative   pH, UA 6.5 5.0 - 8.0   Protein Ur, POC negative negative mg/dL   Urobilinogen, UA 0.2 0.2 or 1.0 E.U./dL   Nitrite, UA Negative Negative   Leukocytes, UA Negative Negative  POCT Microscopic Urinalysis ([facility])  Result Value Ref Range   WBC,UR,HPF,POC None None WBC/hpf   RBC,UR,HPF,POC Moderate (A) None RBC/hpf   Bacteria Many (A) None, Too numerous to count   Mucus Absent Absent   Epithelial Cells, UR Per Microscopy Few (A) None, Too numerous to count cells/hpf      Assessment & Plan:    Renee Simmeringnne M Frankowski is a 71 y.o. female Hematuria, unspecified type - Plan: POCT urinalysis dipstick, POCT Microscopic Urinalysis ([facility]), Ambulatory referral to Urology  - persistent asx hematuria. Reports diagnosis many years ago,  but unknown plan for follow up/monitoring. Refer to urology for deciding further eval.   Hypotrichosis Rash and nonspecific skin eruption - Plan: clotrimazole-betamethasone (LOTRISONE) cream Nail abnormality  - with added history of knee pain, and nail abnormalities, psoriasis in differential. Refer to derm for further eval  - lotrisone for buttock rash if worsens as may have tinea component. If not bothersome, could wait until derm eval.   Advised to return if any return of dysphagia sx's or would refer to ENT if that occurs.   Meds ordered this encounter  Medications  . clotrimazole-betamethasone (LOTRISONE) cream    Sig: Apply 1 application topically 2 (two) times daily.    Dispense:  30 g    Refill:  0   Patient Instructions   I will refer you to dermatologist to evaluate the area on your back as well as thinning eyebrows, and nail changes.  Psoriasis is possible based on the description of your symptoms.  Could consider rheumatology evaluation, especially if you have persistent knee pain, but will start with dermatology evaluation first.  Lotrisone cream could be used to the area on the upper buttocks, as that could be  due to fungus as well as inflammation. However if you want to wait until seen by dermatology to start treatment, that is also okay.  If any further difficulty with swallowing, I would recommend evaluation with Ear nose and throat specialist. Let me know if that occurs.   Urine test did show blood again today. I will refer you to urology to determine if other testing is needed. If any burning with urination, abdominal pain or new symptoms, please return to discuss further.   Keep follow up with neurology as planned.     Hematuria, Adult Hematuria is blood in your urine. It can be caused by a bladder infection, kidney infection, prostate infection, kidney stone, or cancer of your urinary tract. Infections can usually be treated with medicine, and a kidney stone usually will pass through your urine. If neither of these is the cause of your hematuria, further workup to find out the reason may be needed. It is very important that you tell your health care provider about any blood you see in your urine, even if the blood stops without treatment or happens without causing pain. Blood in your urine that happens and then stops and then happens again can be a symptom of a very serious condition. Also, pain is not a symptom in the initial stages of many urinary cancers. Follow these instructions at home:  Drink lots of fluid, 3-4 quarts a day. If you have been diagnosed with an infection, cranberry juice is especially recommended, in addition to large amounts of water.  Avoid caffeine, tea, and carbonated beverages because they tend to irritate the bladder.  Avoid alcohol because it may irritate the prostate.  Take all medicines as directed by your health care provider.  If you were prescribed an antibiotic medicine, finish it all even if you start to feel better.  If you have been diagnosed with a kidney stone, follow your health care provider's instructions regarding straining your urine to catch the  stone.  Empty your bladder often. Avoid holding urine for long periods of time.  After a bowel movement, women should cleanse front to back. Use each tissue only once.  Empty your bladder before and after sexual intercourse if you are a female. Contact a health care provider if:  You develop back pain.  You have a fever.  You have a feeling of sickness in your stomach (nausea) or vomiting.  Your symptoms are not better in 3 days. Return sooner if you are getting worse. Get help right away if:  You develop severe vomiting and are unable to keep the medicine down.  You develop severe back or abdominal pain despite taking your medicines.  You begin passing a large amount of blood or clots in your urine.  You feel extremely weak or faint, or you pass out. This information is not intended to replace advice given to you by your health care provider. Make sure you discuss any questions you have with your health care provider. Document Released: 02/01/2005 Document Revised: 07/10/2015 Document Reviewed: 10/02/2012 Elsevier Interactive Patient Education  2017 [employer]   IF you received an x-ray today, you will receive an invoice from [facility]. Please contact [facility] at [phone] with questions or concerns regarding your invoice.   IF you received labwork today, you will receive an invoice from [facility]. Please contact [facility] at [phone] with questions or concerns regarding your invoice.   Our billing staff will not be able to assist you with questions regarding bills from these companies.  You will be contacted with the lab results as soon as they are available. The fastest way to get your results is to activate your My Chart account. Instructions are located on the last page of this paperwork. If you have not heard from us regarding the results in 2 weeks, please contact this office.      I personally performed the services described in this  documentation, which was scribed in my presence. The recorded information has been reviewed and considered for accuracy and completeness, addended by me as needed, and agree with information above.  Signed,   Meredith StaggersJeffrey Riccardo Holeman, MD Primary Care at [facility].  01/13/17 4:36 PM

## 2017-01-18 ENCOUNTER — Telehealth: Payer: Self-pay | Admitting: Family Medicine

## 2017-01-18 NOTE — Telephone Encounter (Signed)
Kathie RhodesBetty from [facility] sent me a message stating that pt has an appt with Dr. Frances FurbishAthar at their office on 12/19 for sleep study consults, and Dr. Frances FurbishAthar said she can address the falls for the neuro referral at this appt as well so pt does not have two referrals. They asked that I close the neuro referral. Thanks!

## 2017-01-31 ENCOUNTER — Institutional Professional Consult (permissible substitution): Payer: Medicare Other | Admitting: Neurology

## 2017-02-02 ENCOUNTER — Institutional Professional Consult (permissible substitution): Payer: Medicare Other | Admitting: Neurology

## 2017-02-03 ENCOUNTER — Ambulatory Visit: Payer: Medicare Other

## 2017-03-09 ENCOUNTER — Encounter: Payer: Self-pay | Admitting: Neurology

## 2017-03-09 ENCOUNTER — Ambulatory Visit: Payer: Medicare Other | Admitting: Neurology

## 2017-03-09 VITALS — BP 147/82 | HR 98 | Ht 64.5 in | Wt 180.0 lb

## 2017-03-09 DIAGNOSIS — Z820 Family history of epilepsy and other diseases of the nervous system: Secondary | ICD-10-CM | POA: Diagnosis not present

## 2017-03-09 DIAGNOSIS — Z9181 History of falling: Secondary | ICD-10-CM | POA: Diagnosis not present

## 2017-03-09 DIAGNOSIS — R0683 Snoring: Secondary | ICD-10-CM

## 2017-03-09 DIAGNOSIS — R351 Nocturia: Secondary | ICD-10-CM

## 2017-03-09 DIAGNOSIS — E669 Obesity, unspecified: Secondary | ICD-10-CM

## 2017-03-09 NOTE — Patient Instructions (Addendum)
Fall risk is real! Please remember to stand up slowly and get your bearings first turn slowly, no bending down to pick anything, no heavy lifting, be extra careful at night and first thing in the morning. Also, be careful in the Bathroom and the kitchen.  Please increase your water intake.  Consider using a cane for safety.  Your falls may be due to a combination of things: normal aging, suboptimal water intake, scoliosis, degenerative arthritis of your knees, and possible atherosclerosis of the blood vessels in your brain. Please refrain from smoking marijuana altogether.   Remember to drink plenty of fluid, eat healthy meals and do not skip any meals. Try to eat protein with a every meal and eat a healthy snack such as fruit or nuts in between meals. Try to keep a regular sleep-wake schedule and try to exercise daily, particularly in the form of walking, 20-30 minutes a day, if you can. Change positions slowly and you should start using a cane.   As far as diagnostic testing: we will do a MRI brain.   Based on your symptoms and your exam I believe you are at risk for obstructive sleep apnea or OSA, and I think we should proceed with a sleep study to determine whether you do or do not have OSA and how severe it is. If you have more than mild OSA, I want you to consider treatment with CPAP. Please remember, the risks and ramifications of moderate to severe obstructive sleep apnea or OSA are: Cardiovascular disease, including congestive heart failure, stroke, difficult to control hypertension, arrhythmias, and even type 2 diabetes has been linked to untreated OSA. Sleep apnea causes disruption of sleep and sleep deprivation in most cases, which, in turn, can cause recurrent headaches, problems with memory, mood, concentration, focus, and vigilance. Most people with untreated sleep apnea report excessive daytime sleepiness, which can affect their ability to drive. Please do not drive if you feel sleepy.   I  will likely see you back after your tests and we will keep you posted as to your test.   Our sleep lab administrative assistant will call you to schedule your sleep study. If you don't hear back from her by about 2 weeks from now, please feel free to call her at [phone]. You can leave a message with your phone number and concerns, if you get the voicemail box. She will call back as soon as possible.

## 2017-03-09 NOTE — Progress Notes (Signed)
Subjective:    Patient ID: Renee Young is a 72 y.o. female.  HPI     Renee FoleySaima Gwendloyn Forsee, MD, PhD [facility] [address], [address] [address] [place], [place] [id number]  Dear Dr. Neva Young,   I saw your patient, Renee Young, upon your kind request in my neurologic clinic today for initial consultation of her sleep disorder, in particular, concern for underlying obstructive sleep apnea and Hx of gait disorder. The patient is unaccompanied today. Of note, she no showed for an appointment on 02/02/2017. As you know, Renee Young is a 72 year old right-handed woman with an underlying medical history of depression, arthritis and bilateral knee pain, allergies, anemia, ADD, type 2 diabetes, kidney stones, hyperlipidemia, and obesity, who reports snoring and non-restorative sleep. I reviewed your office note from 01/11/2017. The patient has had some falls. Her Epworth sleepiness score is 2 out of 24 today, fatigue score is 33 out of 63. She is married and lives with her husband. They have 2 children. She is retired. She is a nonsmoker and drinks alcohol about once a week, smokes marijuana less than 2/year and drinks caffeine in the form of tea or soda, 2 servings per day on average.  She states, that she has always fallen easily, even in HS. In the past few years, she has fallen more easily, she had one episode of LOC, she attributes this to dehydration, was [place] her [place] in [place] and fell into water, as she was leaning over and looking down. This was in 2015. She hit her head, but did not seek medical attention. She has a history of scoliosis. She has arthritis affecting both knees. She does not typically use a cane or walker. She admits that she does not drink water very much. Typically just in the morning and in the evening, she may not drink enough fluids altogether. Her husband has complained about her snoring. She had a sleep study many years ago which showed sleep apnea but she  has never been on CPAP. She has a family history of sleep apnea in her brother, her sister's son, and also suspected in her maternal grandfather. Bedtime is around 11, wakeup time around 8:30. She has nocturia about 2-3 times per average night, denies morning headaches. Most recent fall occurred in November as she was doing yard work.   Her Past Medical History Is Significant For: Past Medical History:  Diagnosis Date  . ADD (attention deficit disorder)   . Allergy   . Anemia   . Arthritis   . Depression   . Diabetes mellitus without complication (HCC)   . Kidney stones   . UTI (urinary tract infection)     Her Past Surgical History Is Significant For: Past Surgical History:  Procedure Laterality Date  . CESAREAN SECTION    . TUBAL LIGATION    . URETEROCELE INCISION      Her Family History Is Significant For: Family History  Problem Relation Age of Onset  . Cancer Mother   . Kidney disease Mother   . Diabetes Father   . Heart disease Father   . Hyperlipidemia Father   . Hypertension Father   . Stroke Father   . Arthritis Sister   . Obesity Brother   . Arthritis Brother   . Heart disease Maternal Grandmother   . Stroke Maternal Grandmother   . Hypertension Maternal Grandmother   . Heart disease Maternal Grandfather   . Hyperlipidemia Maternal Grandfather   . Stroke Paternal  Grandmother   . Heart disease Paternal Grandmother   . Heart disease Paternal Grandfather   . Heart attack Paternal Grandfather   . Arthritis Daughter     Her Social History Is Significant For: Social History   Socioeconomic History  . Marital status: Married    Spouse name: None  . Number of children: None  . Years of education: None  . Highest education level: None  Social Needs  . Financial resource strain: None  . Food insecurity - worry: None  . Food insecurity - inability: None  . Transportation needs - medical: None  . Transportation needs - non-medical: None  Occupational  History  . None  Tobacco Use  . Smoking status: Never Smoker  . Smokeless tobacco: Never Used  Substance and Sexual Activity  . Alcohol use: Yes    Alcohol/week: 1.2 oz    Types: 2 Glasses of wine per week  . Drug use: No  . Sexual activity: Yes  Other Topics Concern  . None  Social History Narrative   Married   Retired Financial trader    Her Allergies Are:  Allergies  Allergen Reactions  . Nitrofurantoin   . Penicillins   :   Her Current Medications Are:  Outpatient Encounter Medications as of 03/09/2017  Medication Sig  . amphetamine-dextroamphetamine (ADDERALL) 20 MG tablet Take 20 mg by mouth 2 (two) times daily.  . cholecalciferol (VITAMIN D) 1000 units tablet Take 1,000 Units by mouth daily.  Marland Kitchen FLUoxetine (PROZAC) 20 MG capsule Take 20 mg by mouth 3 (three) times daily.   Marland Kitchen FLUZONE HIGH-DOSE 0.5 ML SUSY TO BE ADMINISTERED BY PHARMACIST FOR IMMUNIZATION  . [DISCONTINUED] clotrimazole-betamethasone (LOTRISONE) cream Apply 1 application topically 2 (two) times daily.  . [DISCONTINUED] omeprazole (PRILOSEC) 20 MG capsule Take 20 mg by mouth daily.  . [DISCONTINUED] Turmeric 500 MG TABS Take by mouth.   No facility-administered encounter medications on file as of 03/09/2017.   :  Review of Systems:  Out of a complete 14 point review of systems, all are reviewed and negative with the exception of these symptoms as listed below:   Review of Systems  Neurological:       Pt presents today to discuss her sleep and falls. Pt had a sleep study more than 10 years ago but has never been on a cpap. Pt does endorse snoring. Pt is also complaining of multiple falls.  Epworth Sleepiness Scale 0= would never doze 1= slight chance of dozing 2= moderate chance of dozing 3= high chance of dozing  Sitting and reading: 0 Watching TV: 1 Sitting inactive in a public place (ex. Theater or meeting): 0 As a passenger in a car for an hour without a break: 0 Lying down to rest in  the afternoon: 1 Sitting and talking to someone: 0 Sitting quietly after lunch (no alcohol): 0 In a car, while stopped in traffic: 0 Total: 2     Objective:  Neurological Exam  Physical Exam Physical Examination:   Vitals:   03/09/17 0850  BP: (!) 147/82  Pulse: 98    General Examination: The patient is a very pleasant 72 y.o. female in no acute distress. She appears well-developed and well-nourished and well groomed.   HEENT: Normocephalic, atraumatic, pupils are equal, round and reactive to light and accommodation. Extraocular tracking is good without limitation to gaze excursion or nystagmus noted. Normal smooth pursuit is noted. Hearing is grossly intact. Face is symmetric with normal facial animation and  normal facial sensation. Speech is clear with no dysarthria noted. There is no hypophonia. There is no lip, neck/head, jaw or voice tremor. Neck is supple with full range of passive and active motion. Oropharynx exam reveals: moderate mouth dryness, adequate dental hygiene and moderate airway crowding, due to redundant soft palate, tonsils in place of 1+. Mallampati is class II. Tongue protrudes centrally and palate elevates symmetrically. Neck size is 15 inches. She has a mild overbite.   Chest: Clear to auscultation without wheezing, rhonchi or crackles noted.  Heart: S1+S2+0, regular and normal without murmurs, rubs or gallops noted.   Abdomen: Soft, non-tender and non-distended with normal bowel sounds appreciated on auscultation.  Extremities: There is no pitting edema in the distal lower extremities bilaterally. Pedal pulses are intact.  Skin: Warm and dry without trophic changes noted.  Musculoskeletal: exam reveals no obvious joint deformities, tenderness or joint swelling or erythema, with the exception of arthritic changes in both hands, knee swelling in knee discomfort bilaterally, scoliosis noted.  Neurologically:  Mental status: The patient is awake, alert and  oriented in all 4 spheres. Her immediate and remote memory, attention, language skills and fund of knowledge are appropriate. There is no evidence of aphasia, agnosia, apraxia or anomia. Speech is clear with normal prosody and enunciation. Thought process is linear. Mood is normal and affect is normal.  Cranial nerves II - XII are as described above under HEENT exam. In addition: shoulder shrug is normal but shoulder height is unequal, right shoulder higher than left. Motor exam: Normal bulk, strength and tone is noted. There is no drift, tremor or rebound. Romberg is negative, except for mild sway. Reflexes are 1-2+ throughout. Fine motor skills and coordination: intact with normal finger taps, normal hand movements, normal rapid alternating patting, normal foot taps and normal foot agility.  Cerebellar testing: No dysmetria or intention tremor on finger to nose testing. Heel to shin is unremarkable bilaterally. There is no truncal or gait ataxia.  Sensory exam: intact to light touch in the upper and lower extremities.  Gait, station and balance: She stands easily. No veering to one side is noted. No leaning to one side is noted. Posture is age-appropriate and stance is narrow based. Gait shows normal stride length and normal pace. No problems turning are noted.  Assessment and Plan:    In summary, Renee Young is a very pleasant 72 y.o.-year old female with an underlying medical history of depression, arthritis and bilateral knee pain, allergies, anemia, ADD, type 2 diabetes, kidney stones, hyperlipidemia, and obesity, whose history and physical exam are concerning for obstructive sleep apnea (OSA). As far as her falls and fall risk, this is likely secondary to a combination of factors including aging, scoliosis, arthritis, suboptimal hydration and prior falls. She is advised to stay better hydrated with water, refrain from smoking marijuana altogether, consider using a cane for gait safety. We will  proceed with a brain MRI without contrast to rule out any structural cause. I talked to the patient about OSA, its prognosis and treatment options. We talked about medical treatments, surgical interventions and non-pharmacological approaches. I explained in particular the risks and ramifications of untreated moderate to severe OSA, especially with respect to developing cardiovascular disease down the Road, including congestive heart failure, difficult to treat hypertension, cardiac arrhythmias, or stroke. Even type 2 diabetes has, in part, been linked to untreated OSA. Symptoms of untreated OSA include daytime sleepiness, memory problems, mood irritability and mood disorder such as depression  and anxiety, lack of energy, as well as recurrent headaches, especially morning headaches. We talked about trying to maintain a healthy lifestyle in general, as well as the importance of weight control. I encouraged the patient to eat healthy, exercise daily and keep well hydrated, to keep a scheduled bedtime and wake time routine, to not skip any meals and eat healthy snacks in between meals. I advised the patient not to drive when feeling sleepy. I recommended the following at this time: sleep study with potential positive airway pressure titration. (We will score hypopneas at 4%).   I explained the sleep test procedure to the patient and also outlined possible surgical and non-surgical treatment options of OSA, including the use of a custom-made dental device (which would require a referral to a specialist dentist or oral surgeon), upper airway surgical options, such as pillar implants, radiofrequency surgery, tongue base surgery, and UPPP (which would involve a referral to an ENT surgeon). Rarely, jaw surgery such as mandibular advancement may be considered.  I also explained the CPAP treatment option to the patient, who indicated that she would be willing to try CPAP if the need arises, but is not sure if her husband  would want her to be on a CPAP.  I explained the importance of being compliant with PAP treatment, not only for insurance purposes but primarily to improve Her symptoms, and for the patient's long term health benefit, including to reduce Her cardiovascular risks. I answered all her questions today and the patient was in agreement.  I would like to see her back after the sleep study and MRI brain are completed and we'll keep her posted in the inte as to her test results by phone call.  Thank you very much for allowing me to participate in the care of this nice patient. If I can be of any further assistance to you please do not hesitate to call me at [phone].  Sincerely,   Renee Foley, MD, PhD

## 2017-03-14 ENCOUNTER — Ambulatory Visit
Admission: RE | Admit: 2017-03-14 | Discharge: 2017-03-14 | Disposition: A | Discharge status: Home / Self Care | Payer: Medicare Other | Source: Ambulatory Visit | Attending: Family Medicine | Admitting: Family Medicine

## 2017-03-14 DIAGNOSIS — Z1231 Encounter for screening mammogram for malignant neoplasm of breast: Secondary | ICD-10-CM

## 2017-04-20 ENCOUNTER — Encounter: Payer: Self-pay | Admitting: Physician Assistant

## 2017-04-20 ENCOUNTER — Ambulatory Visit (INDEPENDENT_AMBULATORY_CARE_PROVIDER_SITE_OTHER): Payer: Medicare Other

## 2017-04-20 ENCOUNTER — Other Ambulatory Visit: Payer: Self-pay

## 2017-04-20 ENCOUNTER — Ambulatory Visit: Payer: Medicare Other | Admitting: Physician Assistant

## 2017-04-20 VITALS — BP 131/77 | HR 100 | Temp 98.0°F | Resp 16 | Ht 63.0 in | Wt 180.4 lb

## 2017-04-20 DIAGNOSIS — Z9181 History of falling: Secondary | ICD-10-CM

## 2017-04-20 DIAGNOSIS — M25561 Pain in right knee: Secondary | ICD-10-CM

## 2017-04-20 DIAGNOSIS — R0781 Pleurodynia: Secondary | ICD-10-CM

## 2017-04-20 NOTE — Patient Instructions (Addendum)
  Take ibuprofen and Tylenol for pain for the next 3-5 days.  Apply heat and/or ice, whichever feels better. Do this 2-3 times daily.  Aspercream is an analgesic over the counter medication that may be helpful.  Come back if you are not improving in 7-10 days.   Thank you for coming in today. I hope you feel we met your needs.  Feel free to call PCP if you have any questions or further requests.  Please consider signing up for MyChart if you do not already have it, as this is a great way to communicate with me.  Best,  Whitney McVey, PA-C   IF you received an x-ray today, you will receive an invoice from [facility]. Please contact [facility] at [phone] with questions or concerns regarding your invoice.   IF you received labwork today, you will receive an invoice from [facility]. Please contact [facility] at [phone] with questions or concerns regarding your invoice.   Our billing staff will not be able to assist you with questions regarding bills from these companies.  You will be contacted with the lab results as soon as they are available. The fastest way to get your results is to activate your My Chart account. Instructions are located on the last page of this paperwork. If you have not heard from [place] regarding the results in 2 weeks, please contact this office.

## 2017-04-20 NOTE — Progress Notes (Signed)
Renee Young  MRN: [id number] DOB: 1946/02/02  PCP: [name], MD  Subjective:  Pt is a 72 year old female who presents to clinic for fall 4 days ago. She has been in the process of moving recently. She was carrying a load with both her hands and her shoe got caught on the floor. She fell onto a concrete step, landing on her right knee and left rib. Pain has not improved since that time. Endorses pain under her left breast. +bruises on her right knee and left shoulder. Pain is worse when she takes a deep breath.  Denies shob.   Review of Systems  Respiratory: Negative for cough, chest tightness, shortness of breath and wheezing.   Musculoskeletal: Positive for arthralgias. Negative for gait problem, joint swelling, myalgias, neck pain and neck stiffness.  Skin: Positive for color change (bruising).  Neurological: Negative for weakness.    Patient Active Problem List   Diagnosis Date Noted  . History of colonic polyps 12/17/2016  . Varicose veins of lower extremities with other complications 07/03/2012  . Pain in limb 07/03/2012  . DJD (degenerative joint disease) of knee 03/21/2009  . HYPERLIPIDEMIA 07/19/2007  . DEPRESSION 07/19/2007  . ATTENTION DEFICIT DISORDER 07/19/2007    Current Outpatient Medications on File Prior to Visit  Medication Sig Dispense Refill  . amphetamine-dextroamphetamine (ADDERALL) 20 MG tablet Take 20 mg by mouth 2 (two) times daily.  0  . cholecalciferol (VITAMIN D) 1000 units tablet Take 1,000 Units by mouth daily.    Marland Kitchen FLUoxetine (PROZAC) 20 MG capsule Take 20 mg by mouth 3 (three) times daily.     Marland Kitchen FLUZONE HIGH-DOSE 0.5 ML SUSY TO BE ADMINISTERED BY PHARMACIST FOR IMMUNIZATION  0  . Multiple Vitamin (MULTIVITAMIN) tablet Take 1 tablet by mouth daily.     No current facility-administered medications on file prior to visit.     Allergies  Allergen Reactions  . Nitrofurantoin   . Penicillins      Objective:  BP (!) 161/83   Pulse 100    Temp 98 F (36.7 C) (Oral)   Resp 16   Ht 5\' 3"  (1.6 m)   Wt 180 lb 6.4 oz (81.8 kg)   SpO2 97%   BMI 31.96 kg/m   Physical Exam  Constitutional: She is oriented to person, place, and time and well-developed, well-nourished, and in no distress. No distress.  Cardiovascular: Normal rate, regular rhythm and normal heart sounds.  Pulmonary/Chest: Effort normal and breath sounds normal. She exhibits bony tenderness. She exhibits no laceration, no crepitus and no deformity.    Musculoskeletal:       Left shoulder: She exhibits tenderness. She exhibits normal range of motion, no bony tenderness, no swelling, no effusion, no crepitus and no deformity.       Right knee: She exhibits swelling. She exhibits normal range of motion, no effusion, no deformity and normal patellar mobility. Tenderness found. Medial joint line tenderness noted. No lateral joint line and no patellar tendon tenderness noted.  Neurological: She is alert and oriented to person, place, and time. GCS score is 15.  Skin: Skin is warm and dry.  Psychiatric: Mood, memory, affect and judgment normal.  Vitals reviewed.  Dg Ribs Unilateral W/chest Left  Result Date: 04/20/2017 CLINICAL DATA:  Larey Seat 4 days ago, tender to palpation at central anterior chest EXAM: LEFT RIBS AND CHEST - 3+ VIEW COMPARISON:  None FINDINGS: Normal heart size, mediastinal contours, and pulmonary vascularity. Atherosclerotic calcification aorta.  Question new nodular density versus superimposed artifact at lateral RIGHT upper lobe. Lungs otherwise clear. No acute infiltrate, pleural effusion or pneumothorax. Bones appear demineralized. No rib fracture or bone destruction. IMPRESSION: No acute osseous abnormalities. Question new nodular density at the lateral RIGHT upper chest; CT chest recommended to exclude pulmonary nodule. Electronically Signed   By: Ulyses SouthwardMark  Boles M.D.   On: 04/20/2017 15:29   Dg Knee Complete 4 Views Right  Result Date:  04/20/2017 CLINICAL DATA:  Larey SeatFell 4 days ago, injury, bruising, tender to palpation at medial joint line EXAM: RIGHT KNEE - COMPLETE 4+ VIEW COMPARISON:  03/18/2016 FINDINGS: Mild osseous demineralization. Tricompartmental osteoarthritic changes with joint space narrowing and spur formation. No acute fracture, dislocation, or bone destruction. No knee joint effusion. IMPRESSION: Tricompartmental osteoarthritic changes. No acute abnormalities. Electronically Signed   By: Ulyses SouthwardMark  Boles M.D.   On: 04/20/2017 15:30    Assessment and Plan :  1. Acute pain of right knee - DG Knee Complete 4 Views Right; Future 2. Rib pain on left side - DG Ribs Unilateral W/Chest Left; Future - Pt presents for rib and knee pain s/p fall. It appears she has a h/o falling, which is known to her PCP. Her x-rays are negative for fracture today. Advised PRICE principles. RTC in 7-10 days Iif no improvement.   - Rib x-ray shows possible new nodular density at the lateral RIGHT upper chest; CT chest recommended to exclude pulmonary nodule. Discussed this result with pt today. She does not want further imaging at this time. She would like to discuss this with PCP Dr Renee Young.  Plan to send message to her Dr. Neva Young with this information.  3. History of falling   Marco CollieWhitney Lemonte Al, PA-C  [facility] at [facility] 04/20/2017 2:47 PM

## 2017-04-27 ENCOUNTER — Ambulatory Visit
Admission: RE | Admit: 2017-04-27 | Discharge: 2017-04-27 | Disposition: A | Discharge status: Home / Self Care | Payer: Medicare Other | Source: Ambulatory Visit | Attending: Neurology | Admitting: Neurology

## 2017-04-27 DIAGNOSIS — Z9181 History of falling: Secondary | ICD-10-CM

## 2017-04-27 DIAGNOSIS — Z820 Family history of epilepsy and other diseases of the nervous system: Secondary | ICD-10-CM

## 2017-04-27 DIAGNOSIS — E669 Obesity, unspecified: Secondary | ICD-10-CM

## 2017-04-27 DIAGNOSIS — R351 Nocturia: Secondary | ICD-10-CM

## 2017-04-27 DIAGNOSIS — R0683 Snoring: Secondary | ICD-10-CM

## 2017-04-29 ENCOUNTER — Ambulatory Visit (INDEPENDENT_AMBULATORY_CARE_PROVIDER_SITE_OTHER): Payer: Medicare Other | Admitting: Neurology

## 2017-04-29 DIAGNOSIS — Z9181 History of falling: Secondary | ICD-10-CM

## 2017-04-29 DIAGNOSIS — G4720 Circadian rhythm sleep disorder, unspecified type: Secondary | ICD-10-CM

## 2017-04-29 DIAGNOSIS — Z820 Family history of epilepsy and other diseases of the nervous system: Secondary | ICD-10-CM

## 2017-04-29 DIAGNOSIS — E669 Obesity, unspecified: Secondary | ICD-10-CM

## 2017-04-29 DIAGNOSIS — R0683 Snoring: Secondary | ICD-10-CM

## 2017-04-29 DIAGNOSIS — G4710 Hypersomnia, unspecified: Secondary | ICD-10-CM

## 2017-04-29 DIAGNOSIS — R351 Nocturia: Secondary | ICD-10-CM

## 2017-05-02 ENCOUNTER — Telehealth: Payer: Self-pay

## 2017-05-02 NOTE — Progress Notes (Signed)
Please call patient regarding the recent brain MRI: The brain scan showed a normal structure of the brain and no significant volume loss which we call atrophy. There were changes in the deeper structures of the brain, which we call white matter changes or microvascular changes. These were reported as mild in Her case. These are tiny white spots, that occur with time and are seen in a variety of conditions, including with normal aging, chronic hypertension, chronic headaches, especially migraine HAs, chronic diabetes, chronic hyperlipidemia. These are not strokes and no mass or lesion or contrast enhancement was seen which is reassuring. Again, there were no acute findings, such as a stroke, or mass or blood products. No further action is required on this test at this time, other than re-enforcing the importance of good blood pressure control, good cholesterol control, good blood sugar control, and weight management.   No reason on MRI for her falls.   Please remind patient to keep any upcoming appointments or tests and to call us with any interim questions, concerns, problems or updates.   Thanks,   Huston FoleySaima Audrey Thull, MD, PhD

## 2017-05-02 NOTE — Telephone Encounter (Signed)
I called pt to discuss her sleep study results. No answer, left a message asking her to call me back. 

## 2017-05-02 NOTE — Telephone Encounter (Signed)
-----   Message from Huston FoleySaima Athar, MD sent at 05/02/2017  4:16 PM EDT ----- Please call patient regarding the recent brain MRI: The brain scan showed a normal structure of the brain and no significant volume loss which we call atrophy. There were changes in the deeper structures of the brain, which we call white matter changes or microvascular changes. These were reported as mild in Her case. These are tiny white spots, that occur with time and are seen in a variety of conditions, including with normal aging, chronic hypertension, chronic headaches, especially migraine HAs, chronic diabetes, chronic hyperlipidemia. These are not strokes and no mass or lesion or contrast enhancement was seen which is reassuring. Again, there were no acute findings, such as a stroke, or mass or blood products. No further action is required on this test at this time, other than re-enforcing the importance of good blood pressure control, good cholesterol control, good blood sugar control, and weight management.   No reason on MRI for her falls.   Please remind patient to keep any upcoming appointments or tests and to call us with any interim questions, concerns, problems or updates.   Thanks,   Huston FoleySaima Athar, MD, PhD

## 2017-05-03 NOTE — Telephone Encounter (Signed)
Pt returned my call. I advised her of her MRI brain results. Pt verbalized understanding of results. Pt reports that she had her sleep study last weekend. I will call pt when those results become available. Pt verbalized understanding.

## 2017-05-05 NOTE — Procedures (Signed)
PATIENT'S NAME:  Renee Young, Renee Young DOB:      10/07/1945      MR#:    [id number]     DATE OF RECORDING: 04/29/2017 REFERRING M.D.:  Meredith StaggersJeffrey Greene MD Study Performed:   Baseline Polysomnogram HISTORY: [age] year old right-handed woman with a history of depression, arthritis and bilateral knee pain, allergies, anemia, ADD, type 2 diabetes, kidney stones, hyperlipidemia, and obesity, who reports snoring and non-restorative sleep. The patient endorsed the Epworth Sleepiness Scale at 2 points. The patient's weight 180 pounds with a height of 64 (inches), resulting in a BMI of 30.5 kg/m2. The patient's neck circumference measured 15 inches.   CURRENT MEDICATIONS: Adderall, Vitamin D, Prozac   PROCEDURE:  This is a multichannel digital polysomnogram utilizing the Somnostar 11.2 system.  Electrodes and sensors were applied and monitored per AASM Specifications.   EEG, EOG, Chin and Limb EMG, were sampled at 200 Hz.  ECG, Snore and Nasal Pressure, Thermal Airflow, Respiratory Effort, CPAP Flow and Pressure, Oximetry was sampled at 50 Hz. Digital video and audio were recorded.      BASELINE STUDY  Lights Out was at 22:27 and Lights On at 05:14.  Total recording time (TRT) was 408 minutes, with a total sleep time (TST) of  307.5 minutes.   The patient's sleep latency was 28.5 minutes, which is mildly delayed. REM latency was 364 minutes, which is markedly delayed. The sleep efficiency was 75.4 %.     SLEEP ARCHITECTURE: WASO (Wake after sleep onset) was 86 minutes with mild sleep fragmentation noted and 2 longer periods of wakefulness. There were 13 minutes in Stage N1, 197 minutes Stage N2, 68 minutes Stage N3 and 29.5 minutes in Stage REM.  The percentage of Stage N1 was 4.2%, Stage N2 was 64.1%, which is increased, Stage N3 was 22.1%, which is normal, and Stage R (REM sleep) was 9.6%, which is reduced. The arousals were noted as: 53 were spontaneous, 1 were associated with PLMs, 20 were associated with respiratory  events.   Audio and video analysis did not show any abnormal or unusual movements, behaviors, phonations or vocalizations. The patient took 2 bathroom breaks. Intermittent mild to rare moderate snoring was noted. Then EKG was in keeping with normal sinus rhythm (NSR) with rare PVCs noted.    RESPIRATORY ANALYSIS:  There were a total of 9 respiratory events:  3 obstructive apneas, 0 central apneas and 0 mixed apneas with a total of 3 apneas and an apnea index (AI) of .6 /hour. There were 6 hypopneas with a hypopnea index of 1.2 /hour. The patient also had 16 respiratory event related arousals (RERAs).      The total APNEA/HYPOPNEA INDEX (AHI) was 1.8/hour and the total RESPIRATORY DISTURBANCE INDEX was 4.9 /hour.  1 events occurred in REM sleep and 12 events in NREM. The REM AHI was 2. /hour, versus a non-REM AHI of 1.7. The patient spent 0 minutes of total sleep time in the supine position and 308 minutes in non-supine.. The supine AHI was 0.0 versus a non-supine AHI of 1.8.  OXYGEN SATURATION & C02:  The Wake baseline 02 saturation was 98%, with the lowest being 92%. Time spent below 89% saturation equaled 0 minutes.  PERIODIC LIMB MOVEMENTS: The patient had a total of 8 Periodic Limb Movements.  The Periodic Limb Movement (PLM) index was 1.6 and the PLM Arousal index was .2/hour.  Post-study, the patient indicated that sleep was worse than usual.   IMPRESSION:  1. Primary Snoring 2. Dysfunctions associated  with sleep stages or arousal from sleep  RECOMMENDATIONS:  1. This study does not demonstrate any significant obstructive or central sleep disordered breathing. Snoring was noted, ranging from mild to moderate. For disturbing snoring, an oral appliance (through a qualified dentist) can be considered; weight loss and sleep off the back likely will help alleviate the snoring (supine sleep was not achieved during this study). This study does not support an intrinsic sleep disorder as a cause of  the patient's symptoms. Other causes, including circadian rhythm disturbances, an underlying mood disorder, medication effect and/or an underlying medical problem cannot be ruled out. 2. This study shows sleep fragmentation and abnormal sleep stage percentages; these are nonspecific findings and per se do not signify an intrinsic sleep disorder or a cause for the patient's sleep-related symptoms. Causes include (but are not limited to) the first night effect of the sleep study, circadian rhythm disturbances, medication effect or an underlying mood disorder or medical problem.  3. The patient should be cautioned not to drive, work at heights, or operate dangerous or heavy equipment when tired or sleepy. Review and reiteration of good sleep hygiene measures should be pursued with any patient. 4. The patient will can follow-up with her referring provider, who will be notified of the test results.   I certify that I have reviewed the entire raw data recording prior to the issuance of this report in accordance with the Standards of Accreditation of the American Academy of Sleep Medicine (AASM)    Huston Foley, MD, PhD Diplomat, American Board of Psychiatry and Neurology (Neurology and Sleep Medicine)

## 2017-05-05 NOTE — Progress Notes (Signed)
Patient referred by Dr. Neva SeatGreene, seen by me on 03/09/17, diagnostic PSG on 04/29/17.   Please call and notify the patient that the recent sleep study did not show any significant obstructive sleep apnea. Snoring was noted, for which weight loss and avoiding sleeping on the back will help. For disturbing snoring, an oral appliance (through a qualified dentist) can be considered.  Please remind patient to try to maintain good sleep hygiene, which means: Keep a regular sleep and wake schedule and make enough time for sleep (7 1/2 to 8 1/2 hours for the average adult), try not to exercise or have a meal within 2 hours of your bedtime, try to keep your bedroom conducive for sleep, that is, cool and dark, without light distractors such as an illuminated alarm clock, and refrain from watching TV right before sleep or in the middle of the night and do not keep the TV or radio on during the night. If a nightlight is used, have it away from the visual field. Also, try not to use or play on electronic devices at bedtime, such as your cell phone, tablet PC or laptop. If you like to read at bedtime on an electronic device, try to dim the background light as much as possible. Do not eat in the middle of the night. Keep pets away from the bedroom environment. For stress relief, try meditation, deep breathing exercises (there are many books and CDs available), a white noise machine or fan can help to diffuse other noise distractors, such as traffic noise. Do not drink alcohol before bedtime, as it can disturb sleep and cause middle of the night awakenings. Never mix alcohol and sedating medications! Avoid narcotic pain medication close to bedtime, as opioids/narcotics can suppress breathing drive and breathing effort.   She can FU with PCP at this point.  Thanks,  Huston FoleySaima Marketta Valadez, MD, PhD [facility])

## 2017-05-09 ENCOUNTER — Telehealth: Payer: Self-pay | Admitting: Neurology

## 2017-05-09 NOTE — Telephone Encounter (Signed)
-----   Message from Huston FoleySaima Athar, MD sent at 05/05/2017  8:48 AM EDT ----- Patient referred by Dr. Neva SeatGreene, seen by me on 03/09/17, diagnostic PSG on 04/29/17.   Please call and notify the patient that the recent sleep study did not show any significant obstructive sleep apnea. Snoring was noted, for which weight loss and avoiding sleeping on the back will help. For disturbing snoring, an oral appliance (through a qualified dentist) can be considered.  Please remind patient to try to maintain good sleep hygiene, which means: Keep a regular sleep and wake schedule and make enough time for sleep (7 1/2 to 8 1/2 hours for the average adult), try not to exercise or have a meal within 2 hours of your bedtime, try to keep your bedroom conducive for sleep, that is, cool and dark, without light distractors such as an illuminated alarm clock, and refrain from watching TV right before sleep or in the middle of the night and do not keep the TV or radio on during the night. If a nightlight is used, have it away from the visual field. Also, try not to use or play on electronic devices at bedtime, such as your cell phone, tablet PC or laptop. If you like to read at bedtime on an electronic device, try to dim the background light as much as possible. Do not eat in the middle of the night. Keep pets away from the bedroom environment. For stress relief, try meditation, deep breathing exercises (there are many books and CDs available), a white noise machine or fan can help to diffuse other noise distractors, such as traffic noise. Do not drink alcohol before bedtime, as it can disturb sleep and cause middle of the night awakenings. Never mix alcohol and sedating medications! Avoid narcotic pain medication close to bedtime, as opioids/narcotics can suppress breathing drive and breathing effort.   She can FU with PCP at this point.  Thanks,  Huston FoleySaima Athar, MD, PhD [facility])

## 2017-05-09 NOTE — Telephone Encounter (Signed)
Called patient to discuss sleep study results. No answer at this time. LVM for the patient to call back.   

## 2017-05-10 NOTE — Telephone Encounter (Signed)
I called pt. I advised pt that Dr. Frances FurbishAthar reviewed pt's sleep study and found that pt did not show any significant osa. Snoring was noted and I recommended weight loss and the avoidance of supine sleep, and also recommended an oral appliance made by a qualified dentist if her snoring is disturbing to her. Dr. Frances FurbishAthar recommends that pt follow up with her PCP. I reviewed sleep hygiene recommendations with the pt, including trying to keep a regular sleep wake schedule, avoiding electronics in the bedroom, keeping the bedroom cool, dark, and quiet, and avoiding eating or exercising within 2 hours of bedtime as well as eating in the middle of the night. I advised pt to keep pets out of the bedroom. I discussed with pt the importance of stress relief and to try meditation, deep breathing exercises, and/or a white noise machine or fan to diffuse other noise distractors. I advised pt to not drink alcohol before bedtime and to never mix alcohol and sedating medications. Pt was advised to avoid narcotic pain medication close to bedtime. I advised pt that a copy of these sleep study results will be sent to Dr. Neva SeatGreene. Pt verbalized understanding of results. Pt had no questions at this time but was encouraged to call back if questions arise.

## 2017-05-25 ENCOUNTER — Encounter: Payer: Self-pay | Admitting: Physician Assistant

## 2017-12-01 ENCOUNTER — Telehealth: Payer: Self-pay | Admitting: Family Medicine

## 2017-12-01 NOTE — Telephone Encounter (Signed)
Dr Neva Seat please advise. Dgaddy, CM

## 2017-12-01 NOTE — Telephone Encounter (Signed)
Copied from CRM [id number]. Topic: Referral - Request for Referral >> Dec 01, 2017  9:59 AM Gaynelle Adu wrote: Has patient seen PCP for this complaint? Yes  Referral for which specialty: Diagnostic breast exam   Preferred provider/office: the [facility] ( [address]) Reason for referral:  Lump on left breast

## 2017-12-01 NOTE — Telephone Encounter (Signed)
More info needed please.  It appears her most recent mammogram was in January with repeat testing recommended in 1 year.  I do see the information on lump of left breast discussed in 2014, but not recently.  If new findings/symptoms, recommend eval in office first to determine next step.  Thanks.

## 2017-12-02 NOTE — Telephone Encounter (Signed)
Spoke with pt and she informed me that she has a Lump on her breast that is changing colors and would like to have it evaluated. Pt states she has a cold at this time so she will call back and schedule an appointment once she is over her cold.

## 2017-12-03 NOTE — Telephone Encounter (Signed)
Sounds good.  We can eval in office and determine if imaging needed.

## 2017-12-06 ENCOUNTER — Ambulatory Visit: Payer: Medicare Other | Admitting: Family Medicine

## 2017-12-06 ENCOUNTER — Encounter: Payer: Self-pay | Admitting: Family Medicine

## 2017-12-06 VITALS — BP 118/60 | HR 94 | Temp 98.6°F | Ht 62.5 in | Wt 180.4 lb

## 2017-12-06 DIAGNOSIS — Z23 Encounter for immunization: Secondary | ICD-10-CM | POA: Diagnosis not present

## 2017-12-06 DIAGNOSIS — N6082 Other benign mammary dysplasias of left breast: Secondary | ICD-10-CM

## 2017-12-06 DIAGNOSIS — L0390 Cellulitis, unspecified: Secondary | ICD-10-CM

## 2017-12-06 MED ORDER — SULFAMETHOXAZOLE-TRIMETHOPRIM 800-160 MG PO TABS: 1.0000 | ORAL_TABLET | Freq: Two times a day (BID) | ORAL | 0 refills | Status: DC

## 2017-12-06 NOTE — Progress Notes (Signed)
Subjective:    Patient ID: Renee Young, female    DOB: [birth year], 72 y.o.   MRN: [id number]  HPI   Patient presents to clinic to establish with new PCP, she had previously lived close to [place], but recently moved to [place] area - She is new to [place].  Patient's mood is stable on Prozac 60 mg/day.  She also uses Adderall 2 times a day due to ADD, feels this medication is very helpful to her so she can accomplish daily tasks.  Patient's medical history, social history, surgical history and family history reviewed and updated accordingly in chart.  Patient's main concern is a lump on the left breast she noticed about 3 months ago, at first it was small about the size of a grain of rice.  Now it is about the size of a marble, seems to be more surface like on the skin rather than within the breast.  States that she decided to finally get it checked out due to skin around the area becoming red and a little tender.  Denies fever or chills.  Denies any drainage from the area.  Last mammogram was done in January 2019 and was negative for abnormality.  Patient Active Problem List   Diagnosis Date Noted  . History of colonic polyps 12/17/2016  . Varicose veins of lower extremities with other complications 07/03/2012  . Pain in limb 07/03/2012  . DJD (degenerative joint disease) of knee 03/21/2009  . HYPERLIPIDEMIA 07/19/2007  . DEPRESSION 07/19/2007  . ATTENTION DEFICIT DISORDER 07/19/2007   Social History   Tobacco Use  . Smoking status: Never Smoker  . Smokeless tobacco: Never Used  Substance Use Topics  . Alcohol use: Yes    Alcohol/week: 2.0 standard drinks    Types: 2 Glasses of wine per week   Past Surgical History:  Procedure Laterality Date  . CESAREAN SECTION    . TUBAL LIGATION    . URETEROCELE INCISION     Family History  Problem Relation Age of Onset  . Cancer Mother   . Kidney disease Mother   . Diabetes Father   . Heart disease Father   . Hyperlipidemia  Father   . Hypertension Father   . Stroke Father   . Arthritis Sister   . Obesity Brother   . Arthritis Brother   . Heart disease Maternal Grandmother   . Stroke Maternal Grandmother   . Hypertension Maternal Grandmother   . Heart disease Maternal Grandfather   . Hyperlipidemia Maternal Grandfather   . Stroke Paternal Grandmother   . Heart disease Paternal Grandmother   . Heart disease Paternal Grandfather   . Heart attack Paternal Grandfather   . Arthritis Daughter    Review of Systems  Constitutional: Negative for chills, fatigue and fever.  HENT: Negative for congestion, ear pain, sinus pain and sore throat.   Eyes: Negative.   Respiratory: Negative for cough, shortness of breath and wheezing.   Cardiovascular: Negative for chest pain, palpitations and leg swelling.  Gastrointestinal: Negative for abdominal pain, diarrhea, nausea and vomiting.  Genitourinary: Negative for dysuria, frequency and urgency.  Musculoskeletal: Negative for arthralgias and myalgias.  Skin: Area on left breast - ?cyst of skin.  Neurological: Negative for syncope, light-headedness and headaches.  Psychiatric/Behavioral: The patient is not nervous/anxious.      Objective:   Physical Exam  Constitutional: She is oriented to person, place, and time. She appears well-nourished. No distress.  HENT:  Head: Normocephalic and atraumatic.  Eyes:  Conjunctivae and EOM are normal. No scleral icterus.  Neck: Neck supple. No tracheal deviation present.  Cardiovascular: Normal rate and regular rhythm.  Pulmonary/Chest: Effort normal and breath sounds normal. No respiratory distress. She has no wheezes. She has no rales. Right breast exhibits no inverted nipple, no mass, no nipple discharge, no skin change and no tenderness. Left breast exhibits skin change. Left breast exhibits no inverted nipple, no nipple discharge and no tenderness.  Blue circle is area in question on breast, is about size of marble, skin is  red and tender in that area, does seem to be more on surface of skin. Otherwise no other breast abnormalities palpated.     Musculoskeletal: Normal range of motion. She exhibits no edema or deformity.  Neurological: She is alert and oriented to person, place, and time.  Skin: Skin is warm and dry. She is not diaphoretic. No pallor.  Psychiatric: She has a normal mood and affect. Her behavior is normal.  Nursing note and vitals reviewed.  Vitals:   12/06/17 1446  BP: 118/60  Pulse: 94  Temp: 98.6 F (37 C)  SpO2: 93%      Assessment & Plan:   Cyst of skin of left breast/cellulitis -- I suspect area on left breast is an inflamed cyst, with surrounding skin cellulitis.  We will treat with antibiotic Bactrim twice daily for 10 days.  Due to cyst area feeling hard and being about the size of marble, I want to be sure nothing else is happening in the breast, so we will get new mammogram imaging completed.  Patient is agreeable to this plan.  Flu vaccine given in clinic today.  Patient will follow-up in approximately 3 months for monitoring of her chronic medical conditions.  She is aware she can return to clinic sooner if any issues arise.

## 2017-12-07 ENCOUNTER — Encounter: Payer: Self-pay | Admitting: Family Medicine

## 2018-01-05 ENCOUNTER — Ambulatory Visit: Payer: Medicare Other | Admitting: Sports Medicine

## 2018-01-05 ENCOUNTER — Encounter: Payer: Self-pay | Admitting: Sports Medicine

## 2018-01-05 VITALS — BP 146/76 | Ht 63.5 in | Wt 180.0 lb

## 2018-01-05 DIAGNOSIS — M170 Bilateral primary osteoarthritis of knee: Secondary | ICD-10-CM | POA: Diagnosis not present

## 2018-01-05 MED ORDER — METHYLPREDNISOLONE ACETATE 40 MG/ML IJ SUSP
40.0000 mg | Freq: Once | INTRAMUSCULAR | Status: AC
  Administered 2018-01-05: 40 mg via INTRA_ARTICULAR

## 2018-01-05 NOTE — Patient Instructions (Signed)
Voltaren 1% Gel is the cream that you are going to look for while you are in [place]....  If you have any pain despite this injection, feel free to give us a call...Marland Kitchen..Marland Kitchen

## 2018-01-06 ENCOUNTER — Encounter: Payer: Self-pay | Admitting: Sports Medicine

## 2018-01-06 NOTE — Progress Notes (Signed)
   Subjective:    Patient ID: Renee Young, female    DOB: 07/18/1945, 72 y.o.   MRN: [id number]  HPI chief complaint: Bilateral knee pain  72 year old female comes in today requesting repeat cortisone injections into each knee.  She has a documented history of bilateral knee DJD.  Last cortisone injection was in the spring 2018.  This was followed by a round of Visco supplementation.  Visco supplementation worked well for her up until recently.  She has a trip to [place] planned in a couple of weeks.  Her pain is beginning to return.  It is identical in nature to what she has experienced previously.  No recent trauma.  She does endorse some mild intermittent swelling.  Also stiffness.  No mechanical symptoms.  Interim medical history reviewed Medications reviewed Allergies reviewed    Review of Systems As above    Objective:   Physical Exam  Well-developed, well-nourished.  No acute distress.  Awake alert and oriented x3.  Vital signs reviewed  Examination of each knee shows range of motion from 0 to 120 degrees.  1+ boggy synovitis bilaterally.  Tenderness to palpation along the medial joint lines bilaterally.  Negative McMurray's.  Knee is grossly stable ligamentous exam.  Neurovascularly intact distally.  Walking with a slight limp.      Assessment & Plan:  Returning bilateral knee pain secondary to DJD  Each of her knees were injected today with cortisone.  This was accomplished atraumatically under sterile technique utilizing an anterior lateral approach.  Patient tolerates this without difficulty.  If symptoms persist consider updated x-rays.  Since she will be traveling to [place], I recommended that she purchase some Voltaren gel and use that as needed.  Follow-up for ongoing or recalcitrant issues.  Consent obtained and verified. Time-out conducted. Noted no overlying erythema, induration, or other signs of local infection. Skin prepped in a sterile fashion. Topical analgesic  spray: Ethyl chloride. Joint: right knee Needle: 25g 1.5 inch Completed without difficulty. Meds: 3cc 1% xylocaine, 1cc (40mg ) depomedrol  Advised to call if fevers/chills, erythema, induration, drainage, or persistent bleeding.  Consent obtained and verified. Time-out conducted. Noted no overlying erythema, induration, or other signs of local infection. Skin prepped in a sterile fashion. Topical analgesic spray: Ethyl chloride. Joint: left knee Needle: 25g 1.5 inch Completed without difficulty. Meds: 3cc 1% xylocaine, 1cc (40mg ) depomedrol  Advised to call if fevers/chills, erythema, induration, drainage, or persistent bleeding.

## 2018-02-20 ENCOUNTER — Other Ambulatory Visit: Payer: Self-pay | Admitting: Family Medicine

## 2018-02-20 DIAGNOSIS — Z1231 Encounter for screening mammogram for malignant neoplasm of breast: Secondary | ICD-10-CM

## 2018-03-17 ENCOUNTER — Ambulatory Visit
Admission: RE | Admit: 2018-03-17 | Discharge: 2018-03-17 | Disposition: A | Discharge status: Home / Self Care | Payer: Medicare Other | Source: Ambulatory Visit | Attending: Family Medicine | Admitting: Family Medicine

## 2018-03-17 DIAGNOSIS — Z1231 Encounter for screening mammogram for malignant neoplasm of breast: Secondary | ICD-10-CM

## 2018-06-27 ENCOUNTER — Ambulatory Visit (INDEPENDENT_AMBULATORY_CARE_PROVIDER_SITE_OTHER): Payer: Medicare Other | Admitting: Family Medicine

## 2018-06-27 ENCOUNTER — Other Ambulatory Visit: Payer: Self-pay

## 2018-06-27 ENCOUNTER — Ambulatory Visit: Payer: Self-pay

## 2018-06-27 ENCOUNTER — Encounter: Payer: Self-pay | Admitting: Family Medicine

## 2018-06-27 VITALS — BP 138/88 | HR 105 | Ht 64.0 in | Wt 180.0 lb

## 2018-06-27 DIAGNOSIS — M170 Bilateral primary osteoarthritis of knee: Secondary | ICD-10-CM | POA: Diagnosis not present

## 2018-06-27 MED ORDER — METHYLPREDNISOLONE ACETATE 40 MG/ML IJ SUSP
40.0000 mg | Freq: Once | INTRAMUSCULAR | Status: AC
  Administered 2018-06-27: 14:00:00 40 mg via INTRA_ARTICULAR

## 2018-06-27 MED ORDER — METHYLPREDNISOLONE ACETATE 40 MG/ML IJ SUSP
40.0000 mg | Freq: Once | INTRAMUSCULAR | Status: AC
  Administered 2018-06-27: 40 mg via INTRA_ARTICULAR

## 2018-06-27 MED ORDER — DICLOFENAC SODIUM 2 % TD SOLN: 1.0000 "application " | Freq: Two times a day (BID) | TRANSDERMAL | 2 refills | Status: DC

## 2018-06-27 NOTE — Progress Notes (Signed)
Renee Young - 73 y.o. female MRN [id number]  Date of birth: 1945/03/07  SUBJECTIVE:  Including CC & ROS.  Chief Complaint  Patient presents with   Knee Pain    bilateral knee    Renee Young is a 73 y.o. female that is presenting with acute on chronic worsening bilateral knee pain.  The pain is occurring over the medial joint line of each knee.  Having some swelling.  Denies any mechanical symptoms.  The pain is throbbing in nature.  Has received steroid and gel injections in the past.  The last steroid injection was in November of pain.  She does get relief with steroid injections.  She denies any inciting event or acute trauma.  Has not been to physical therapy in some time.  Pain is localized to the knee.  Denies any fevers or chills.  Denies any numbness or tingling..  Independent review of the right knee xray from 2019 shows mild to moderate degenerative changes.   Independent review of the left knee xray from 2019 shows severe degenerative changes.    Review of Systems  Constitutional: Negative for fever.  HENT: Negative for congestion.   Respiratory: Negative for cough.   Cardiovascular: Negative for chest pain.  Gastrointestinal: Negative for abdominal pain.  Musculoskeletal: Positive for arthralgias and joint swelling.  Skin: Negative for color change.  Neurological: Negative for weakness.  Hematological: Negative for adenopathy.    HISTORY: Past Medical, Surgical, Social, and Family History Reviewed & Updated per EMR.   Pertinent Historical Findings include:  Past Medical History:  Diagnosis Date   ADD (attention deficit disorder)    Allergy    Anemia    Arthritis    Chicken pox    Colon polyps    Depression    Diabetes mellitus without complication (HCC)    GERD (gastroesophageal reflux disease)    Kidney stones    Phlebitis    UTI (urinary tract infection)    UTI (urinary tract infection)     Past Surgical History:  Procedure Laterality  Date   CESAREAN SECTION     TUBAL LIGATION     URETEROCELE INCISION      Allergies  Allergen Reactions   Nitrofurantoin    Penicillins     Family History  Problem Relation Age of Onset   Cancer Mother    Kidney disease Mother    Diabetes Father    Heart disease Father    Hyperlipidemia Father    Hypertension Father    Stroke Father    Arthritis Sister    Obesity Brother    Arthritis Brother    Heart disease Maternal Grandmother    Stroke Maternal Grandmother    Hypertension Maternal Grandmother    Heart disease Maternal Grandfather    Hyperlipidemia Maternal Grandfather    Stroke Paternal Grandmother    Heart disease Paternal Grandmother    Heart disease Paternal Grandfather    Heart attack Paternal Grandfather    Arthritis Daughter      Social History   Socioeconomic History   Marital status: Married    Spouse name: Not on file   Number of children: Not on file   Years of education: Not on file   Highest education level: Not on file  Occupational History   Not on file  Social Needs   Financial resource strain: Not on file   Food insecurity:    Worry: Not on file    Inability: Not on file  Transportation needs:    Medical: Not on file    Non-medical: Not on file  Tobacco Use   Smoking status: Never Smoker   Smokeless tobacco: Never Used  Substance and Sexual Activity   Alcohol use: Yes    Alcohol/week: 2.0 standard drinks    Types: 2 Glasses of wine per week   Drug use: No   Sexual activity: Yes  Lifestyle   Physical activity:    Days per week: Not on file    Minutes per session: Not on file   Stress: Not on file  Relationships   Social connections:    Talks on phone: Not on file    Gets together: Not on file    Attends religious service: Not on file    Active member of club or organization: Not on file    Attends meetings of clubs or organizations: Not on file    Relationship status: Not on file    Intimate partner violence:    Fear of current or ex partner: Not on file    Emotionally abused: Not on file    Physically abused: Not on file    Forced sexual activity: Not on file  Other Topics Concern   Not on file  Social History Narrative   Married   Retired Financial trader     PHYSICAL EXAM:  VS: BP 138/88    Pulse (!) 105    Ht 5\' 4"  (1.626 m)    Wt 180 lb (81.6 kg)    BMI 30.90 kg/m  Physical Exam Gen: NAD, alert, cooperative with exam, well-appearing ENT: normal lips, normal nasal mucosa,  Eye: normal EOM, normal conjunctiva and lids CV:  no edema, +2 pedal pulses   Resp: no accessory muscle use, non-labored,  Skin: no rashes, no areas of induration  Neuro: normal tone, normal sensation to touch Psych:  normal insight, alert and oriented MSK:  Right and left Knee: Normal to inspection with no erythema  Mild effusion b/l  Palpation normal with no warmth,patellar tenderness, or condyle tenderness. Mild medial joint line tenderness  ROM full in flexion and extension and lower leg rotation. Ligaments with solid consistent endpoints including  LCL, MCL. Negative Mcmurray's tests. Non painful patellar compression. Patellar glide without crepitus. Patellar and quadriceps tendons unremarkable. Hamstring and quadriceps strength is normal.  Neurovascularly intact     Aspiration/Injection Procedure Note Renee Young Mar 29, 1945  Procedure: Injection Indications: Right knee pain  Procedure Details Consent: Risks of procedure as well as the alternatives and risks of each were explained to the (patient/caregiver).  Consent for procedure obtained. Time Out: Verified patient identification, verified procedure, site/side was marked, verified correct patient position, special equipment/implants available, medications/allergies/relevent history reviewed, required imaging and test results available.  Performed.  The area was cleaned with iodine and alcohol swabs.    The  right knee superior lateral suprapatellar pouch was injected using 1 cc's of 40 mg Depo-Medrol and 4 cc's of 0.5 % bupivacaine with a 22 1 1/2" needle.  Ultrasound was used. Images were obtained in long views showing the injection.     A sterile dressing was applied.  Patient did tolerate procedure well.   Aspiration/Injection Procedure Note Renee Young 07-10-1945  Procedure: Injection Indications: Left knee pain  Procedure Details Consent: Risks of procedure as well as the alternatives and risks of each were explained to the (patient/caregiver).  Consent for procedure obtained. Time Out: Verified patient identification, verified procedure, site/side was marked, verified  correct patient position, special equipment/implants available, medications/allergies/relevent history reviewed, required imaging and test results available.  Performed.  The area was cleaned with iodine and alcohol swabs.    The left knee superior lateral suprapatellar pouch was injected using 1 cc's of 40 mg Depo-Medrol and 4 cc's of 0.5 % bupivacaine with a 22 1 1/2" needle.  Ultrasound was used. Images were obtained in long views showing the injection.     A sterile dressing was applied.  Patient did tolerate procedure well.     ASSESSMENT & PLAN:   DJD (degenerative joint disease) of knee Acute on chronic worsening of her degenerative changes in each knee.  No mechanical symptoms today. -Bilateral knee injections. -Pennsaid. -Counseled on home exercise therapy and supportive care. -If no improvement would consider gel injections. Would also consider getting updated x-rays consider physical therapy

## 2018-06-27 NOTE — Patient Instructions (Signed)
Nice to meet you  Take tylenol 650 mg three times a day is the best evidence based medicine we have for arthritis.  Glucosamine sulfate 750mg  twice a day is a supplement that has been shown to help moderate to severe arthritis. Vitamin D 2000 IU daily Fish oil 2 grams daily.  Tumeric 500mg  twice daily.  Capsaicin topically up to four times a day may also help with pain. Cortisone injections are an option if these interventions do not seem to make a difference or need more relief.  Please send me a message on MyChart with any questions or updates.  Please see me back and we can do the gel injections.

## 2018-06-27 NOTE — Assessment & Plan Note (Signed)
Acute on chronic worsening of her degenerative changes in each knee.  No mechanical symptoms today. -Bilateral knee injections. -Pennsaid. -Counseled on home exercise therapy and supportive care. -If no improvement would consider gel injections. Would also consider getting updated x-rays consider physical therapy

## 2018-07-06 ENCOUNTER — Other Ambulatory Visit: Payer: Self-pay

## 2018-07-06 ENCOUNTER — Ambulatory Visit (INDEPENDENT_AMBULATORY_CARE_PROVIDER_SITE_OTHER): Payer: Medicare Other | Admitting: Family Medicine

## 2018-07-06 ENCOUNTER — Ambulatory Visit: Payer: Self-pay

## 2018-07-06 ENCOUNTER — Encounter: Payer: Self-pay | Admitting: Family Medicine

## 2018-07-06 VITALS — BP 149/76 | HR 99 | Ht 65.0 in | Wt 180.0 lb

## 2018-07-06 DIAGNOSIS — M170 Bilateral primary osteoarthritis of knee: Secondary | ICD-10-CM

## 2018-07-06 NOTE — Assessment & Plan Note (Signed)
Acutely worsening of her knee pain.  Did not get much benefit from the steroid injections. -Initiate gelsyn 3 gel injections.  - f/u in one week.

## 2018-07-06 NOTE — Patient Instructions (Signed)
Good to see you  Please send me a message in MyChart with any questions or updates.  Please see me back in 1 week.   --Dr. Schmitz  

## 2018-07-06 NOTE — Progress Notes (Addendum)
Renee Young - 73 y.o. female MRN [id number]  Date of birth: May 23, 1945  SUBJECTIVE:  Including CC & ROS.  Chief Complaint  Patient presents with  . Knee Pain    bilateral knee    Renee Young is a 73 y.o. female that is presenting with acute on chronic worsening bilateral knee pain.  She was given steroid injections on 5/12.  She had some improvement initially with that but the pain quickly returned.  The pain is sharp and throbbing.  She has received gel injections in the past and those lasted for about a year.  She denies any radicular symptoms.  Symptoms seem to be localized to the knee.  She denies any mechanical symptoms.  Symptoms are moderate to severe.  Symptoms are worse after activity.  Denies any injury or inciting trauma..    Review of Systems  Constitutional: Negative for fever.  HENT: Negative for congestion.   Respiratory: Negative for cough.   Cardiovascular: Negative for chest pain.  Gastrointestinal: Negative for abdominal pain.  Musculoskeletal: Positive for arthralgias.  Skin: Negative for color change.  Neurological: Negative for weakness.  Hematological: Negative for adenopathy.    HISTORY: Past Medical, Surgical, Social, and Family History Reviewed & Updated per EMR.   Pertinent Historical Findings include:  Past Medical History:  Diagnosis Date  . ADD (attention deficit disorder)   . Allergy   . Anemia   . Arthritis   . Chicken pox   . Colon polyps   . Depression   . Diabetes mellitus without complication (HCC)   . GERD (gastroesophageal reflux disease)   . Kidney stones   . Phlebitis   . UTI (urinary tract infection)   . UTI (urinary tract infection)     Past Surgical History:  Procedure Laterality Date  . CESAREAN SECTION    . TUBAL LIGATION    . URETEROCELE INCISION      Allergies  Allergen Reactions  . Nitrofurantoin   . Penicillins     Family History  Problem Relation Age of Onset  . Cancer Mother   . Kidney disease Mother    . Diabetes Father   . Heart disease Father   . Hyperlipidemia Father   . Hypertension Father   . Stroke Father   . Arthritis Sister   . Obesity Brother   . Arthritis Brother   . Heart disease Maternal Grandmother   . Stroke Maternal Grandmother   . Hypertension Maternal Grandmother   . Heart disease Maternal Grandfather   . Hyperlipidemia Maternal Grandfather   . Stroke Paternal Grandmother   . Heart disease Paternal Grandmother   . Heart disease Paternal Grandfather   . Heart attack Paternal Grandfather   . Arthritis Daughter      Social History   Socioeconomic History  . Marital status: Married    Spouse name: Not on file  . Number of children: Not on file  . Years of education: Not on file  . Highest education level: Not on file  Occupational History  . Not on file  Social Needs  . Financial resource strain: Not on file  . Food insecurity:    Worry: Not on file    Inability: Not on file  . Transportation needs:    Medical: Not on file    Non-medical: Not on file  Tobacco Use  . Smoking status: Never Smoker  . Smokeless tobacco: Never Used  Substance and Sexual Activity  . Alcohol use: Yes  Alcohol/week: 2.0 standard drinks    Types: 2 Glasses of wine per week  . Drug use: No  . Sexual activity: Yes  Lifestyle  . Physical activity:    Days per week: Not on file    Minutes per session: Not on file  . Stress: Not on file  Relationships  . Social connections:    Talks on phone: Not on file    Gets together: Not on file    Attends religious service: Not on file    Active member of club or organization: Not on file    Attends meetings of clubs or organizations: Not on file    Relationship status: Not on file  . Intimate partner violence:    Fear of current or ex partner: Not on file    Emotionally abused: Not on file    Physically abused: Not on file    Forced sexual activity: Not on file  Other Topics Concern  . Not on file  Social History  Narrative   Married   Retired Financial trader     PHYSICAL EXAM:  VS: BP (!) 149/76   Pulse 99   Ht  (1.651 m)   Wt 180 lb (81.6 kg)   BMI 29.95 kg/m  Physical Exam Gen: NAD, alert, cooperative with exam, well-appearing ENT: normal lips, normal nasal mucosa,  Eye: normal EOM, normal conjunctiva and lids CV:  no edema, +2 pedal pulses   Resp: no accessory muscle use, non-labored,  Skin: no rashes, no areas of induration  Neuro: normal tone, normal sensation to touch Psych:  normal insight, alert and oriented MSK:  Right and left Knee: Normal to inspection with no erythema or effusion or obvious bony abnormalities. Palpation normal with no warmth or condyle tenderness. Mild medial joint line tenderness b/l  ROM full in flexion and extension and lower leg rotation. Mild instability with valgus and varus stress testing. Negative Mcmurray's [place]ss[place] tests. Mild pain with patellar compression. Patellar and quadriceps tendons unremarkable. Hamstring and quadriceps strength is normal.  Neurovascularly intact    Aspiration/Injection Procedure Note Renee Young 28-Sep-1945  Procedure: Injection Indications: right knee pain   Procedure Details Consent: Risks of procedure as well as the alternatives and risks of each were explained to the (patient/caregiver).  Consent for procedure obtained. Time Out: Verified patient identification, verified procedure, site/side was marked, verified correct patient position, special equipment/implants available, medications/allergies/relevent history reviewed, required imaging and test results available.  Performed.  The area was cleaned with iodine and alcohol swabs.    The right knee superior lateral suprapatellar pouch was injected using 4 cc's of 1% lidocaine with a 21 2" needle.  The syringe was switched and a 16.8 mg/69mL of Gelsyn3 was injected. Ultrasound was used. Images were obtained in  Long views showing the injection.     A sterile dressing was applied.  Patient did tolerate procedure well.      Aspiration/Injection Procedure Note Renee Young 07-Nov-1945  Procedure: Injection Indications: left knee pain   Procedure Details Consent: Risks of procedure as well as the alternatives and risks of each were explained to the (patient/caregiver).  Consent for procedure obtained. Time Out: Verified patient identification, verified procedure, site/side was marked, verified correct patient position, special equipment/implants available, medications/allergies/relevent history reviewed, required imaging and test results available.  Performed.  The area was cleaned with iodine and alcohol swabs.    The left knee superior lateral suprapatellar pouch was injected using 4 cc's of  1% lidocaine with a 21 2" needle.  The syringe was switched and a 16.8 mg/2 mL of Gelsyn3 was injected. Ultrasound was used. Images were obtained in  Long views showing the injection.    A sterile dressing was applied.  Patient did tolerate procedure well.      ASSESSMENT & PLAN:   DJD (degenerative joint disease) of knee Acutely worsening of her knee pain.  Did not get much benefit from the steroid injections. -Initiate gelsyn 3 gel injections.  - f/u in one week.

## 2018-07-13 ENCOUNTER — Ambulatory Visit: Payer: Self-pay

## 2018-07-13 ENCOUNTER — Other Ambulatory Visit: Payer: Self-pay

## 2018-07-13 ENCOUNTER — Ambulatory Visit (INDEPENDENT_AMBULATORY_CARE_PROVIDER_SITE_OTHER): Payer: Medicare Other | Admitting: Family Medicine

## 2018-07-13 DIAGNOSIS — M170 Bilateral primary osteoarthritis of knee: Secondary | ICD-10-CM | POA: Diagnosis not present

## 2018-07-13 NOTE — Assessment & Plan Note (Signed)
Completed 2 of 3 Gelsyn 3 injection  - f/u in one week

## 2018-07-13 NOTE — Patient Instructions (Signed)
Good to see you  Please send me a message in MyChart with any questions or updates.  Please see me back in 1 week.   --Dr. Schmitz  

## 2018-07-13 NOTE — Progress Notes (Signed)
Renee Young - 73 y.o. female MRN [id number]  Date of birth: 02/12/1946  SUBJECTIVE:  Including CC & ROS.  Chief Complaint  Patient presents with  . Knee Pain    Bilateral knee    Renee Young is a 73 y.o. female that is  Presenting for her second injection of Gelsyn 3.    Review of Systems  HISTORY: Past Medical, Surgical, Social, and Family History Reviewed & Updated per EMR.   Pertinent Historical Findings include:  Past Medical History:  Diagnosis Date  . ADD (attention deficit disorder)   . Allergy   . Anemia   . Arthritis   . Chicken pox   . Colon polyps   . Depression   . Diabetes mellitus without complication (HCC)   . GERD (gastroesophageal reflux disease)   . Kidney stones   . Phlebitis   . UTI (urinary tract infection)   . UTI (urinary tract infection)     Past Surgical History:  Procedure Laterality Date  . CESAREAN SECTION    . TUBAL LIGATION    . URETEROCELE INCISION      Allergies  Allergen Reactions  . Nitrofurantoin   . Penicillins     Family History  Problem Relation Age of Onset  . Cancer Mother   . Kidney disease Mother   . Diabetes Father   . Heart disease Father   . Hyperlipidemia Father   . Hypertension Father   . Stroke Father   . Arthritis Sister   . Obesity Brother   . Arthritis Brother   . Heart disease Maternal Grandmother   . Stroke Maternal Grandmother   . Hypertension Maternal Grandmother   . Heart disease Maternal Grandfather   . Hyperlipidemia Maternal Grandfather   . Stroke Paternal Grandmother   . Heart disease Paternal Grandmother   . Heart disease Paternal Grandfather   . Heart attack Paternal Grandfather   . Arthritis Daughter      Social History   Socioeconomic History  . Marital status: Married    Spouse name: Not on file  . Number of children: Not on file  . Years of education: Not on file  . Highest education level: Not on file  Occupational History  . Not on file  Social Needs  . Financial  resource strain: Not on file  . Food insecurity:    Worry: Not on file    Inability: Not on file  . Transportation needs:    Medical: Not on file    Non-medical: Not on file  Tobacco Use  . Smoking status: Never Smoker  . Smokeless tobacco: Never Used  Substance and Sexual Activity  . Alcohol use: Yes    Alcohol/week: 2.0 standard drinks    Types: 2 Glasses of wine per week  . Drug use: No  . Sexual activity: Yes  Lifestyle  . Physical activity:    Days per week: Not on file    Minutes per session: Not on file  . Stress: Not on file  Relationships  . Social connections:    Talks on phone: Not on file    Gets together: Not on file    Attends religious service: Not on file    Active member of club or organization: Not on file    Attends meetings of clubs or organizations: Not on file    Relationship status: Not on file  . Intimate partner violence:    Fear of current or ex partner: Not on file  Emotionally abused: Not on file    Physically abused: Not on file    Forced sexual activity: Not on file  Other Topics Concern  . Not on file  Social History Narrative   Married   Retired Financial trader     PHYSICAL EXAM:  VS: There were no vitals taken for this visit. Physical Exam Gen: NAD, alert, cooperative with exam, well-appearing   Aspiration/Injection Procedure Note Renee Young 02-14-1946  Procedure: Injection Indications: Right knee pain   Procedure Details Consent: Risks of procedure as well as the alternatives and risks of each were explained to the (patient/caregiver).  Consent for procedure obtained. Time Out: Verified patient identification, verified procedure, site/side was marked, verified correct patient position, special equipment/implants available, medications/allergies/relevent history reviewed, required imaging and test results available.  Performed.  The area was cleaned with iodine and alcohol swabs.    The right knee superior lateral  suprapatellar pouch was injected using 4 cc's of 1% lidocaine with a 22 1 1/2" needle.  The syringe was switched and a 16.8/2 mL of Gelsyn 3 was injected. Ultrasound was used. Images were obtained in  Long views showing the injection.     A sterile dressing was applied.  Patient did tolerate procedure well.   Aspiration/Injection Procedure Note Renee Young 11/18/1945  Procedure: Injection Indications: left knee pain   Procedure Details Consent: Risks of procedure as well as the alternatives and risks of each were explained to the (patient/caregiver).  Consent for procedure obtained. Time Out: Verified patient identification, verified procedure, site/side was marked, verified correct patient position, special equipment/implants available, medications/allergies/relevent history reviewed, required imaging and test results available.  Performed.  The area was cleaned with iodine and alcohol swabs.    The left knee superior lateral suprapatellar pouch was injected using 4 cc's of 1% lidocaine with a 22 1 1/2" needle.  The syringe was switched and a 16.8 mg/2 mL of Gelsyn 3 was injected. Ultrasound was used. Images were obtained in  Long views showing the injection.    A sterile dressing was applied.  Patient did tolerate procedure well.     ASSESSMENT & PLAN:   DJD (degenerative joint disease) of knee Completed 2 of 3 Gelsyn 3 injection  - f/u in one week

## 2018-07-20 ENCOUNTER — Ambulatory Visit: Payer: Self-pay

## 2018-07-20 ENCOUNTER — Ambulatory Visit (INDEPENDENT_AMBULATORY_CARE_PROVIDER_SITE_OTHER): Payer: Medicare Other | Admitting: Family Medicine

## 2018-07-20 DIAGNOSIS — M170 Bilateral primary osteoarthritis of knee: Secondary | ICD-10-CM

## 2018-07-20 NOTE — Progress Notes (Signed)
Renee Young - 73 y.o. female MRN [id number]  Date of birth: 02/26/1945  SUBJECTIVE:  Including CC & ROS.  No chief complaint on file.   Renee Young is a 73 y.o. female that is presenting for her third Gelsyn 3 injection.    Review of Systems  HISTORY: Past Medical, Surgical, Social, and Family History Reviewed & Updated per EMR.   Pertinent Historical Findings include:  Past Medical History:  Diagnosis Date  . ADD (attention deficit disorder)   . Allergy   . Anemia   . Arthritis   . Chicken pox   . Colon polyps   . Depression   . Diabetes mellitus without complication (HCC)   . GERD (gastroesophageal reflux disease)   . Kidney stones   . Phlebitis   . UTI (urinary tract infection)   . UTI (urinary tract infection)     Past Surgical History:  Procedure Laterality Date  . CESAREAN SECTION    . TUBAL LIGATION    . URETEROCELE INCISION      Allergies  Allergen Reactions  . Nitrofurantoin   . Penicillins     Family History  Problem Relation Age of Onset  . Cancer Mother   . Kidney disease Mother   . Diabetes Father   . Heart disease Father   . Hyperlipidemia Father   . Hypertension Father   . Stroke Father   . Arthritis Sister   . Obesity Brother   . Arthritis Brother   . Heart disease Maternal Grandmother   . Stroke Maternal Grandmother   . Hypertension Maternal Grandmother   . Heart disease Maternal Grandfather   . Hyperlipidemia Maternal Grandfather   . Stroke Paternal Grandmother   . Heart disease Paternal Grandmother   . Heart disease Paternal Grandfather   . Heart attack Paternal Grandfather   . Arthritis Daughter      Social History   Socioeconomic History  . Marital status: Married    Spouse name: Not on file  . Number of children: Not on file  . Years of education: Not on file  . Highest education level: Not on file  Occupational History  . Not on file  Social Needs  . Financial resource strain: Not on file  . Food insecurity:     Worry: Not on file    Inability: Not on file  . Transportation needs:    Medical: Not on file    Non-medical: Not on file  Tobacco Use  . Smoking status: Never Smoker  . Smokeless tobacco: Never Used  Substance and Sexual Activity  . Alcohol use: Yes    Alcohol/week: 2.0 standard drinks    Types: 2 Glasses of wine per week  . Drug use: No  . Sexual activity: Yes  Lifestyle  . Physical activity:    Days per week: Not on file    Minutes per session: Not on file  . Stress: Not on file  Relationships  . Social connections:    Talks on phone: Not on file    Gets together: Not on file    Attends religious service: Not on file    Active member of club or organization: Not on file    Attends meetings of clubs or organizations: Not on file    Relationship status: Not on file  . Intimate partner violence:    Fear of current or ex partner: Not on file    Emotionally abused: Not on file    Physically abused: Not  on file    Forced sexual activity: Not on file  Other Topics Concern  . Not on file  Social History Narrative   Married   Retired Financial traderTeacher   Exercises     PHYSICAL EXAM:  VS: There were no vitals taken for this visit. Physical Exam Gen: NAD, alert, cooperative with exam, well-appearing    Aspiration/Injection Procedure Note Kellie Simmeringnne M Whipple 08/03/1945  Procedure: Injection Indications: right knee pain   Procedure Details Consent: Risks of procedure as well as the alternatives and risks of each were explained to the (patient/caregiver).  Consent for procedure obtained. Time Out: Verified patient identification, verified procedure, site/side was marked, verified correct patient position, special equipment/implants available, medications/allergies/relevent history reviewed, required imaging and test results available.  Performed.  The area was cleaned with iodine and alcohol swabs.    The right knee superior lateral suprapatellar pouch was injected using 4 cc's of 1%  lidocaine with a 22 1 1/2" needle.  The syringe was switched and a 16.8 mg/2 mL of Gelsyn 3 was injected. Ultrasound was used. Images were obtained in  Long views showing the injection.    A sterile dressing was applied.  Patient did tolerate procedure well.   Aspiration/Injection Procedure Note Kellie Simmeringnne M Stampley 09/23/1945  Procedure: Injection Indications: left knee pain   Procedure Details Consent: Risks of procedure as well as the alternatives and risks of each were explained to the (patient/caregiver).  Consent for procedure obtained. Time Out: Verified patient identification, verified procedure, site/side was marked, verified correct patient position, special equipment/implants available, medications/allergies/relevent history reviewed, required imaging and test results available.  Performed.  The area was cleaned with iodine and alcohol swabs.    The left knee superior lateral suprapatellar pouch was injected using 4 cc's of 1% lidocaine with a 22 1 1/2" needle.  The syringe was switched and a 16.8 mg/2 mL of Gelsyn 3 was injected. Ultrasound was used. Images were obtained in  Long views showing the injection.     A sterile dressing was applied.  Patient did tolerate procedure well.    ASSESSMENT & PLAN:   DJD (degenerative joint disease) of knee Has completed her series of gel injections. -She can follow-up in 4 weeks if needed. -Could consider PRP injections.

## 2018-07-20 NOTE — Patient Instructions (Signed)
Good to see you  Please send me a message in MyChart with any questions or updates.  Please see me back in 4 weeks if needed.   --Dr. Jordan Likes

## 2018-07-20 NOTE — Assessment & Plan Note (Signed)
Has completed her series of gel injections. -She can follow-up in 4 weeks if needed. -Could consider PRP injections.

## 2019-03-09 ENCOUNTER — Other Ambulatory Visit: Payer: Self-pay | Admitting: Family Medicine

## 2019-03-09 DIAGNOSIS — Z1231 Encounter for screening mammogram for malignant neoplasm of breast: Secondary | ICD-10-CM

## 2019-04-03 DIAGNOSIS — F332 Major depressive disorder, recurrent severe without psychotic features: Secondary | ICD-10-CM | POA: Diagnosis not present

## 2019-04-03 DIAGNOSIS — F411 Generalized anxiety disorder: Secondary | ICD-10-CM | POA: Diagnosis not present

## 2019-04-03 DIAGNOSIS — F902 Attention-deficit hyperactivity disorder, combined type: Secondary | ICD-10-CM | POA: Diagnosis not present

## 2019-04-11 DIAGNOSIS — Z23 Encounter for immunization: Secondary | ICD-10-CM | POA: Diagnosis not present

## 2019-04-17 ENCOUNTER — Ambulatory Visit: Payer: Medicare PPO | Admitting: Sports Medicine

## 2019-04-17 ENCOUNTER — Ambulatory Visit
Admission: RE | Admit: 2019-04-17 | Discharge: 2019-04-17 | Disposition: A | Discharge status: Home / Self Care | Payer: Medicare PPO | Source: Ambulatory Visit | Attending: *Deleted | Admitting: *Deleted

## 2019-04-17 ENCOUNTER — Other Ambulatory Visit: Payer: Self-pay

## 2019-04-17 ENCOUNTER — Ambulatory Visit
Admission: RE | Admit: 2019-04-17 | Discharge: 2019-04-17 | Disposition: A | Discharge status: Home / Self Care | Payer: Medicare PPO | Source: Ambulatory Visit | Attending: Sports Medicine | Admitting: Sports Medicine

## 2019-04-17 ENCOUNTER — Encounter: Payer: Self-pay | Admitting: Sports Medicine

## 2019-04-17 VITALS — BP 148/82 | Ht 64.25 in | Wt 180.0 lb

## 2019-04-17 DIAGNOSIS — M1712 Unilateral primary osteoarthritis, left knee: Secondary | ICD-10-CM | POA: Diagnosis not present

## 2019-04-17 DIAGNOSIS — M170 Bilateral primary osteoarthritis of knee: Secondary | ICD-10-CM

## 2019-04-17 DIAGNOSIS — Z1231 Encounter for screening mammogram for malignant neoplasm of breast: Secondary | ICD-10-CM

## 2019-04-17 DIAGNOSIS — M1711 Unilateral primary osteoarthritis, right knee: Secondary | ICD-10-CM | POA: Diagnosis not present

## 2019-04-17 NOTE — Patient Instructions (Addendum)
It was great to see you!  Our plans for today:  - Wear your knee compression sleeve while you are walking and out and about. - Do the exercises prescribed to help strengthen the muscles that stablize your knee. - We are getting updated xrays of your knees. We will call you with the results of the xrays.  Go to [facility] at [facility]. [address], [address], [place], [place] [address] Phone: [phone]  After these, if you decide to repeat your knee injections or want a surgical consultation for knee replacement, let us know.   Take care and seek immediate care sooner if you develop any concerns.   Dr. Linwood Dibbles

## 2019-04-17 NOTE — Progress Notes (Signed)
    SUBJECTIVE:   CHIEF COMPLAINT: knee pain  HPI:   Patient endorses occasional bilateral knee pain for the past few months, L>R, with walking that seem to be associated with overextension. States it feels like her knee will give out and reports difficulties with balance and stability when this occurs. Denies any falls. Notices more when getting up from a seated or lying position. States this pain is anteriorly located and feels different than her typical arthritic pain. Pain only lasts a few moments and has difficulty reproducing the pain. Denies any inciting injuries but does note she has been less active this winter due to the weather. Denies any injections or procedures since finishing her gelsyn injections in b/l knees in 07/2018. Has been occurring more frequently lately, as much as several times per week. With multiple times per day, sometimes the pain lasts for a few hours. Has been taking ibuprofen for pain with some relief.  PERTINENT  PMH / PSH: DJD of knee, HLD, depression, ADD  OBJECTIVE:   BP (!) 148/82   Ht 5' 4.25" (1.632 m)   Wt 180 lb (81.6 kg)   BMI 30.66 kg/m   Gen: well appearing, in NAD MSK: Bilateral Knee: Inspection: no redness or swelling. Palpation: tenderness over joint lines, Lateral>medial ROM: full extension and flexion. Strength: 4/5 quad and abductor strength bilaterally. Stability: no appreciable joint laxity. Special Tests: - Anterior/posterior drawer negative - Lachman negative. Candie Chroman with reproduction of arthritic pain. Neurovascular: intact   ASSESSMENT/PLAN:   Knee Pain 2/2 supporting muscle weakness and known DJD. Could also have loose body contributing to pain and instability. No joint or ligamentous laxity on exam. Will update bilateral knee xrays and provide home quad strengthening exercises. Follow up in one month. At that time, if still with significant pain, can consider repeating gelsyn injections. Ultimately, will require  bilateral kene replacements in the future for curative effect.  Ellwood Dense, DO [facility]   Patient seen and evaluated with the resident.  I agree with the above plan of care.  Updated x-rays are reviewed.  She has severe bone-on-bone medial compartmental DJD in both knees.  There is also evidence of loose bodies in the right knee.  Definitive treatment is total knee arthroplasty.  Patient understands this.  We will give her some isometric quad strengthening exercises and we could consider repeat viscosupplementation in the future if she would like but neither one of these things will be curative.

## 2019-05-15 DIAGNOSIS — M26621 Arthralgia of right temporomandibular joint: Secondary | ICD-10-CM | POA: Diagnosis not present

## 2019-05-23 DIAGNOSIS — M26601 Right temporomandibular joint disorder, unspecified: Secondary | ICD-10-CM | POA: Diagnosis not present

## 2019-05-23 DIAGNOSIS — R6884 Jaw pain: Secondary | ICD-10-CM | POA: Diagnosis not present

## 2019-05-23 DIAGNOSIS — M542 Cervicalgia: Secondary | ICD-10-CM | POA: Diagnosis not present

## 2019-05-24 DIAGNOSIS — M170 Bilateral primary osteoarthritis of knee: Secondary | ICD-10-CM | POA: Diagnosis not present

## 2019-05-25 DIAGNOSIS — R6884 Jaw pain: Secondary | ICD-10-CM | POA: Diagnosis not present

## 2019-05-25 DIAGNOSIS — M26601 Right temporomandibular joint disorder, unspecified: Secondary | ICD-10-CM | POA: Diagnosis not present

## 2019-05-25 DIAGNOSIS — M542 Cervicalgia: Secondary | ICD-10-CM | POA: Diagnosis not present

## 2019-05-29 DIAGNOSIS — R6884 Jaw pain: Secondary | ICD-10-CM | POA: Diagnosis not present

## 2019-05-29 DIAGNOSIS — M26601 Right temporomandibular joint disorder, unspecified: Secondary | ICD-10-CM | POA: Diagnosis not present

## 2019-05-29 DIAGNOSIS — M542 Cervicalgia: Secondary | ICD-10-CM | POA: Diagnosis not present

## 2019-05-29 NOTE — Progress Notes (Signed)
Patient ID: Renee Young, female    DOB: 1946/01/02, 74 y.o.   MRN: [id number]  PCP: Jamelle Haring, MD  Chief Complaint  Patient presents with  . New Patient (Initial Visit)    pre diabetic in the past  . Establish Care  . Temporomandibular Joint Pain    would like recommendation for treatment    Subjective:   Renee Young is a 74 y.o. female, presents to clinic with CC of the following:  Chief Complaint  Patient presents with  . New Patient (Initial Visit)    pre diabetic in the past  . Establish Care  . Temporomandibular Joint Pain    would like recommendation for treatment    HPI:  Patient is a 74 year old female who presents today new to the practice. Previously followed with [facility] She noted she has not been seen recently with her primary care provider, and has not had labs done recently other.  Today she notes she is having some pain in her TMJ region, [facility], referred to physical therapy which is helping a lot.  She notes it was bothered her some with headaches and pain in her TMJ region that woke her up a couple nights when was more problematic.  She has not applied any ice topically, and asked about a dental provider who may have expertise in this area to consult with if needed.  She has noted it has been better with the physical therapy helping a lot.  She also noted that she has been prediabetic in the past.  Denies any recent symptoms of increased thirst, peeing a lot, marked fatigue, and no numbness or tingling in her extremities.  She also noted she is traveling to [place] in May, as her daughter lives in [place] and she will be visiting.  She has had her Covid vaccinations, with her second dose received in March  She has been followed by [facility] medicine over the past year plus, with below the results from her most recent visit with them in very early March 2021: Knee Pain - bilat 2/2 supporting muscle weakness  and known DJD. Could also have loose body contributing to pain and instability. No joint or ligamentous laxity on exam. Will update bilateral knee xrays and provide home quad strengthening exercises. Follow up in one month. At that time, if still with significant pain, can consider repeating gelsyn injections. Ultimately, will require bilateral knee replacements in the future for curative effect.  Patient seen and evaluated with the resident.  I agree with the above plan of care.  Updated x-rays are reviewed.  She has severe bone-on-bone medial compartmental DJD in both knees.  There is also evidence of loose bodies in the right knee. Definitive treatment is total knee arthroplasty.  Patient understands this.  We will give her some isometric quad strengthening exercises and we could consider repeat viscosupplementation in the future if she would like but neither one of these things will be curative.  She currently is planning to have a total knee replacement in the fall, starting on the left, and pending how that goes, then having it done on the right.  Patient had a sleep study in March 2019 with the following interpretation and recommendation by neurology: I called pt. I advised pt that Dr. Frances Furbish reviewed pt's sleep study and found that pt did not show any significant osa. Snoring was noted and I recommended weight loss and the  avoidance of supine sleep, and also recommended an oral appliance made by a qualified dentist if her snoring is disturbing to her. Dr. Rexene Alberts recommends that pt follow up with her PCP. I reviewed sleep hygiene recommendations with the pt, including trying to keep a regular sleep wake schedule, avoiding electronics in the bedroom, keeping the bedroom cool, dark, and quiet, and avoiding eating or exercising within 2 hours of bedtime as well as eating in the middle of the night. I advised pt to keep pets out of the bedroom. I discussed with pt the importance of stress relief and to try  meditation, deep breathing exercises, and/or a white noise machine or fan to diffuse other noise distractors. I advised pt to not drink alcohol before bedtime and to never mix alcohol and sedating medications. Pt was advised to avoid narcotic pain medication close to bedtime.  She also had an MRI in March 2019 due to concerns for some recent falls, with the following interpretation: Please call patient regarding the recent brain MRI: The brain scan showed a normal structure of the brain and no significant volume loss which we call atrophy. There were changes in the deeper structures of the brain, which we call white matter changes or microvascular changes. These were reported as mild in Her case. These are tiny white spots, that occur with time and are seen in a variety of conditions, including with normal aging, chronic hypertension, chronic headaches, especially migraine HAs, chronic diabetes, chronic hyperlipidemia. These are not strokes and no mass or lesion or contrast enhancement was seen which is reassuring. Again, there were no acute findings, such as a stroke, or mass or blood products. No further action is required on this test at this time, other than re-enforcing the importance of good blood pressure control, good cholesterol control, good blood sugar control, and weight management.  No reason on MRI for her falls.  She noted she thought the falls then may be related to her hydration status, possibly some balance issues, and had physical therapy initiated for her knees, which has been helpful.  And she noted she has done okay in the more recent past.  Her last annual exam was in November 2018 Noted a h/o:  Hyperlipidemia   Lab Results  Component Value Date   CHOL 277 (H) 01/07/2014   HDL 42 01/07/2014   LDLCALC 192 (H) 01/07/2014   LDLDIRECT 222.0 03/07/2009   TRIG 217 (H) 01/07/2014   CHOLHDL 6.6 01/07/2014   ,  A statin was entertained, although she really did not want to start one  at that time.  She noted she is often averse to medicines and prefers to try other ways to manage if possible.  I did note that I would recommend a statin as well if her numbers returned similar on recheck, and she noted she would be open to trying a statin, and I did encourage that if the numbers remain similar.  She denied any chest pain, palpitations, shortness of breath, increased lower extremity edema, no increased headaches or vision changes of concern,    ADD - on stimulant.  Pt does reports daytime somnolence but states Adderall helps with this which is prescribed by Chapman [facility] at [facility].  She noted she really cannot function well without the Adderall.  She denies any palpitations, racing heart, and her blood pressure has always been okay while taking this.  Depression Takes Prozac.  She notes she has not had major depression concerns, and that the  Prozac was added to help in some respects while taking the Adderall product.  DM Screening She has had borderline Pre-DM/DM with last A1C 6.5 in 12/2014.   CA Screening Colonoscopy: 12/2013, repeat in 3-5 years, + polyps, would like not to do when discussed getting a repeat colonoscopy, no black stools, BRBPR, no abdominal pains Cervical CA screening: last pap smear was ~15 years ago; always normal Breast CA Screening: last mammogram 04/17/19, no evidence of malignancy.  Immunizations     Immunization History  Administered Date(s) Administered  .    Marland Kitchen. Inf Had in fall  . Pneumococcal Conjugate-13 01/07/2014  . Pneumococcal Polysaccharide-23 04/27/2012  . Tdap 04/27/2012  . Covid Had 2 shots, last 3/24   Sleep Disturbances  Pt noted a h/o sleep apnea and snoring in the past, with recent sleep study negative for OSA as above noted.  Trouble Swallowing Pt reported intermittent trouble swallowing in her past.  She denied foods actually getting stuck, sometimes more of a discomfort with swallowing.  She noted  sometimes she has trouble taking her large multivitamin pill.  she had tried a Prilosec product in the past which helped initially, although then symptoms tended to return and she stopped that.  She has not seen gastroenterology or had a scoping procedure done in her past for this.    Patient Active Problem List   Diagnosis Date Noted  . History of colonic polyps 12/17/2016  . Varicose veins of lower extremities with other complications 07/03/2012  . Pain in limb 07/03/2012  . DJD (degenerative joint disease) of knee 03/21/2009  . HYPERLIPIDEMIA 07/19/2007  . DEPRESSION 07/19/2007  . ATTENTION DEFICIT DISORDER 07/19/2007      Current Outpatient Medications:  .  amphetamine-dextroamphetamine (ADDERALL) 20 MG tablet, Take 20 mg by mouth 2 (two) times daily., Disp: , Rfl: 0 .  cholecalciferol (VITAMIN D) 1000 units tablet, Take 1,000 Units by mouth daily., Disp: , Rfl:  .  FLUoxetine (PROZAC) 20 MG capsule, Take 20 mg by mouth 3 (three) times daily. , Disp: , Rfl:  .  Multiple Vitamin (MULTIVITAMIN) tablet, Take 1 tablet by mouth daily., Disp: , Rfl:    Allergies  Allergen Reactions  . Nitrofurantoin   . Penicillins      Past Surgical History:  Procedure Laterality Date  . CESAREAN SECTION    . TUBAL LIGATION    . URETEROCELE INCISION       Family History  Problem Relation Age of Onset  . Cancer Mother   . Kidney disease Mother   . Diabetes Father   . Heart disease Father   . Hyperlipidemia Father   . Hypertension Father   . Stroke Father   . Arthritis Sister   . Obesity Brother   . Arthritis Brother   . Heart disease Maternal Grandmother   . Stroke Maternal Grandmother   . Hypertension Maternal Grandmother   . Heart disease Maternal Grandfather   . Hyperlipidemia Maternal Grandfather   . Stroke Paternal Grandmother   . Heart disease Paternal Grandmother   . Heart disease Paternal Grandfather   . Heart attack Paternal Grandfather   . Arthritis Daughter       Social History   Tobacco Use  . Smoking status: Never Smoker  . Smokeless tobacco: Never Used  Substance Use Topics  . Alcohol use: Yes    Alcohol/week: 2.0 standard drinks    Types: 2 Glasses of wine per week    With staff assistance, above reviewed with the  patient today.  ROS: As per HPI, otherwise no specific complaints on a limited and focused system review   No results found for this or any previous visit (from the past 72 hour(s)).   PHQ2/9: Depression screen [facility] 2/9 05/30/2019 04/20/2017 01/11/2017 12/24/2016 11/01/2016  Decreased Interest 0 0 0 1 0  Down, Depressed, Hopeless 0 0 0 0 0  PHQ - 2 Score 0 0 0 1 0  Altered sleeping 0 - - - -  Tired, decreased energy 0 - - - -  Change in appetite 0 - - - -  Feeling bad or failure about yourself  0 - - - -  Trouble concentrating 0 - - - -  Moving slowly or fidgety/restless 0 - - - -  Suicidal thoughts 0 - - - -  PHQ-9 Score 0 - - - -  Difficult doing work/chores Not difficult at all - - - -   PHQ-2/9 Result is neg  Fall Risk: Fall Risk  05/30/2019 04/20/2017 01/11/2017 12/24/2016 12/24/2016  Falls in the past year? 1 Yes Yes Yes No  Comment - fell on Sunday, 04/17/17 - - -  Number falls in past yr: 0 - 1 2 or more -  Comment - - - APPX 5 -  Injury with Fall? 0 - No No -  Comment - - bruises minor -      Objective:   Vitals:   05/30/19 1002  BP: 126/84  Pulse: 97  Resp: 14  Temp: (!) 97.1 F (36.2 C)  TempSrc: Temporal  SpO2: 98%  Weight: 179 lb 1.6 oz (81.2 kg)  Height: 5\' 4"  (1.626 m)    Body mass index is 30.74 kg/m.  Physical Exam   NAD, masked, pleasant HEENT - [place]/AT, positive glasses, sclera anicteric, PERRL, EOMI, conj - non-inj'ed, TM's and canals clear, pharynx clear, could open her mouth today without TMJ pain. Neck - supple, no adenopathy, no TM, good swallow when asked to assess thyroid without difficulty, carotids 2+ and = without bruits bilat Car - RRR without m/g/r Pulm- RR and effort  normal at rest, CTA without wheeze or rales Abd - soft, obese, diffusely NT, ND, BS+,  no masses Back - no CVA tenderness Skin- no rash noted on exposed areas,  Ext - no LE edema, increased varicosities noted, most concentrated in the left ankle region, much milder on the right.  Nontender to palpate this area.  Dorsalis pedis pulses intact in the feet bilaterally Neuro/psychiatric - affect was not flat, appropriate with conversation  Alert and oriented  Grossly non-focal - good strength on testing extremities, sensation intact to LT in distal extremities, Romberg negative, no pronator drift, DTRs 2+ and equal in the patella, good balance briefly on 1 foot, good finger-to-nose  Speech normal       Assessment & Plan:   1. Prediabetes She noted a history of this in her past. We will recheck labs today. - Hemoglobin A1c - COMPLETE METABOLIC PANEL WITH GFR - CBC with Differential/Platelet  2. Family history of thyroid disease - Dad We will check a TSH for screening purposes. She was agreeable with this. - TSH  3. Mixed hyperlipidemia Noted her last lipid panel was very abnormal, and would strongly encourage a statin product for management if it remains that abnormal.  She did not want to start previously, although after discussion today, she noted she would be open to trying it if her cholesterol profile remains very abnormal.  I noted that I  do anticipate it not changing much from previous and would anticipate recommending adding a statin after that result returns.  Did discuss the risk benefits of the statin, and feel it is very beneficial for her to add to her regimen in that case. Await result from check today. - COMPLETE METABOLIC PANEL WITH GFR - Lipid panel  4. Other depression Denied a history of major depression, and her PHQ-9 today was good. Does see a counselor who prescribes this, and will continue - COMPLETE METABOLIC PANEL WITH GFR - CBC with Differential/Platelet  5.  Attention deficit disorder (ADD) without hyperactivity Noted some concerns with a stimulant medicine, especially at her age and the concerning side effects from these, although she notes she tolerates it well, and really needs this medicine to function most of the time. She will continue to obtain this prescription through her psychologist, as felt needed.  6. Primary osteoarthritis of both knees As above, plans to have a left total knee replacement in the fall, followed by a right pending that result.  7. Bilateral temporomandibular joint pain She notes has been better, with the physical therapy after [facility] very helpful. I recommended adding ice massage to the area, especially at nighttime, relative jaw at rest, and if not remaining improved or worsening again, to consider seeing a dental provider with bite splint as often added to try to help.  Await her response.  8. History of colon polyps Do note she is overdue for the follow-up colonoscopy, and she noted today she very much wants to avoid doing another one.  That is her option, and she is aware of the recommendations for these follow-up which was 3 to 5 years after her prior colonoscopy. She denied any concerning symptoms in the recent past, and will do a CBC to assess for anemia as well.  9. Varicose veins of bilateral lower extremities with other complications Note she has had these for a long time, and are not painful presently.  She actually noted they are better at this point.  10.  Difficulty swallowing Discussed this with her at length, and noted if she has true dysphagia, it is recommended to see gastroenterology, and have further evaluation including a scoping procedure.  She was not anxious to pursue that presently, and she noted things do not actually get stuck and occasionally had some discomfort that she was not convinced the omeprazole product all that helpful.  Again she noted she does not like to take medicines  chronically. If she does note more problems with things getting stuck, emphasized the importance of seeing gastroenterology, and monitoring presently.  11. Breast CA screening Her last mammogram was in March, with no evidence of malignancy noted.   Planned future travel -she inquired about getting a PCR Covid test within 3 days of her planned travel to [place] in May, and discussed places she can obtain that, and what the current travel recommendations are with respect to leaving and returning to our country.  She may need to have that done for the country she is traveling to.  She asked if having the vaccine would be a problem with the testing, and they noted that it would not affect the PCR testing planned.  She is not getting a blood test for antibodies.   Await the lab results presently, and a planned follow-up scheduled for 4 months, follow-up sooner as needed.  Jamelle Haring, MD 05/30/19 10:16 AM

## 2019-05-30 ENCOUNTER — Other Ambulatory Visit: Payer: Self-pay

## 2019-05-30 ENCOUNTER — Encounter: Payer: Self-pay | Admitting: Internal Medicine

## 2019-05-30 ENCOUNTER — Ambulatory Visit: Payer: Medicare PPO | Admitting: Internal Medicine

## 2019-05-30 VITALS — BP 126/84 | HR 97 | Temp 97.1°F | Resp 14 | Ht 64.0 in | Wt 179.1 lb

## 2019-05-30 DIAGNOSIS — F3289 Other specified depressive episodes: Secondary | ICD-10-CM | POA: Diagnosis not present

## 2019-05-30 DIAGNOSIS — I83893 Varicose veins of bilateral lower extremities with other complications: Secondary | ICD-10-CM

## 2019-05-30 DIAGNOSIS — Z8349 Family history of other endocrine, nutritional and metabolic diseases: Secondary | ICD-10-CM

## 2019-05-30 DIAGNOSIS — R7303 Prediabetes: Secondary | ICD-10-CM | POA: Diagnosis not present

## 2019-05-30 DIAGNOSIS — M26623 Arthralgia of bilateral temporomandibular joint: Secondary | ICD-10-CM | POA: Diagnosis not present

## 2019-05-30 DIAGNOSIS — M170 Bilateral primary osteoarthritis of knee: Secondary | ICD-10-CM | POA: Insufficient documentation

## 2019-05-30 DIAGNOSIS — E782 Mixed hyperlipidemia: Secondary | ICD-10-CM | POA: Diagnosis not present

## 2019-05-30 DIAGNOSIS — F988 Other specified behavioral and emotional disorders with onset usually occurring in childhood and adolescence: Secondary | ICD-10-CM

## 2019-05-30 DIAGNOSIS — Z86010 Personal history of colonic polyps: Secondary | ICD-10-CM | POA: Diagnosis not present

## 2019-05-30 HISTORY — DX: Arthralgia of bilateral temporomandibular joint: M26.623

## 2019-05-31 LAB — CBC WITH DIFFERENTIAL/PLATELET
Absolute Monocytes: 546 cells/uL (ref 200–950)
Basophils Absolute: 48 cells/uL (ref 0–200)
Basophils Relative: 0.9 %
Eosinophils Absolute: 239 cells/uL (ref 15–500)
Eosinophils Relative: 4.5 %
HCT: 41.2 % (ref 35.0–45.0)
Hemoglobin: 13.1 g/dL (ref 11.7–15.5)
Lymphs Abs: 1214 cells/uL (ref 850–3900)
MCH: 25 pg — ABNORMAL LOW (ref 27.0–33.0)
MCHC: 31.8 g/dL — ABNORMAL LOW (ref 32.0–36.0)
MCV: 78.5 fL — ABNORMAL LOW (ref 80.0–100.0)
MPV: 9.2 fL (ref 7.5–12.5)
Monocytes Relative: 10.3 %
Neutro Abs: 3254 cells/uL (ref 1500–7800)
Neutrophils Relative %: 61.4 %
Platelets: 331 10*3/uL (ref 140–400)
RBC: 5.25 10*6/uL — ABNORMAL HIGH (ref 3.80–5.10)
RDW: 15.2 % — ABNORMAL HIGH (ref 11.0–15.0)
Total Lymphocyte: 22.9 %
WBC: 5.3 10*3/uL (ref 3.8–10.8)

## 2019-05-31 LAB — COMPLETE METABOLIC PANEL WITH GFR
AG Ratio: 1.4 (calc) (ref 1.0–2.5)
ALT: 16 U/L (ref 6–29)
AST: 15 U/L (ref 10–35)
Albumin: 4 g/dL (ref 3.6–5.1)
Alkaline phosphatase (APISO): 90 U/L (ref 37–153)
BUN: 17 mg/dL (ref 7–25)
CO2: 30 mmol/L (ref 20–32)
Calcium: 9.8 mg/dL (ref 8.6–10.4)
Chloride: 105 mmol/L (ref 98–110)
Creat: 0.72 mg/dL (ref 0.60–0.93)
GFR, Est African American: 96 mL/min/{1.73_m2} (ref >=60)
GFR, Est Non African American: 83 mL/min/{1.73_m2} (ref >=60)
Globulin: 2.9 g/dL (calc) (ref 1.9–3.7)
Glucose, Bld: 93 mg/dL (ref 65–99)
Potassium: 4.2 mmol/L (ref 3.5–5.3)
Sodium: 140 mmol/L (ref 135–146)
Total Bilirubin: 0.4 mg/dL (ref 0.2–1.2)
Total Protein: 6.9 g/dL (ref 6.1–8.1)

## 2019-05-31 LAB — LIPID PANEL
Cholesterol: 278 mg/dL — ABNORMAL HIGH (ref <200)
HDL: 38 mg/dL — ABNORMAL LOW (ref >=50)
LDL Cholesterol (Calc): 195 mg/dL (calc) — ABNORMAL HIGH
Non-HDL Cholesterol (Calc): 240 mg/dL (calc) — ABNORMAL HIGH (ref <130)
Total CHOL/HDL Ratio: 7.3 (calc) — ABNORMAL HIGH (ref <5.0)
Triglycerides: 252 mg/dL — ABNORMAL HIGH (ref <150)

## 2019-05-31 LAB — HEMOGLOBIN A1C
Hgb A1c MFr Bld: 7.1 % of total Hgb — ABNORMAL HIGH (ref <5.7)
Mean Plasma Glucose: 157 (calc)
eAG (mmol/L): 8.7 (calc)

## 2019-05-31 LAB — TSH: TSH: 4.07 mIU/L (ref 0.40–4.50)

## 2019-05-31 NOTE — Progress Notes (Signed)
Renee Young, Lab results from yesterday's visit were reviewed. Your hemoglobin A1c was higher at 7.1, with this reading is consistent with diabetes.  I do think we should discuss starting a medication for this.  (Metformin) Your lipid panel remains very abnormal, and I do think a medication (a statin) would be very helpful to start. Your complete blood count showed that you are not anemic, although the size of the red cells and the hemoglobin content of the red cells were slightly low, and this is not a new finding comparing this to prior complete blood counts.  This may be related to an iron deficiency concern, and I would recommend checking iron studies at this point. The complete metabolic panel and the thyroid screen (TSH) were normal. I would like to have a follow-up visit to discuss the above, including potentially adding a couple new medications as noted above and will ask Melissa to help get a follow-up visit set up to further discuss. Dr. Dorris Fetch

## 2019-06-01 DIAGNOSIS — M542 Cervicalgia: Secondary | ICD-10-CM | POA: Diagnosis not present

## 2019-06-01 DIAGNOSIS — R6884 Jaw pain: Secondary | ICD-10-CM | POA: Diagnosis not present

## 2019-06-01 DIAGNOSIS — M26601 Right temporomandibular joint disorder, unspecified: Secondary | ICD-10-CM | POA: Diagnosis not present

## 2019-06-06 ENCOUNTER — Telehealth: Payer: Self-pay | Admitting: Internal Medicine

## 2019-06-06 DIAGNOSIS — R6884 Jaw pain: Secondary | ICD-10-CM | POA: Diagnosis not present

## 2019-06-06 DIAGNOSIS — M542 Cervicalgia: Secondary | ICD-10-CM | POA: Diagnosis not present

## 2019-06-06 DIAGNOSIS — M26601 Right temporomandibular joint disorder, unspecified: Secondary | ICD-10-CM | POA: Diagnosis not present

## 2019-06-06 NOTE — Telephone Encounter (Signed)
Attempted to schedule AWV. Unable to LVM.  Will try at later time. No vm setup

## 2019-06-08 DIAGNOSIS — M26601 Right temporomandibular joint disorder, unspecified: Secondary | ICD-10-CM | POA: Diagnosis not present

## 2019-06-08 DIAGNOSIS — R6884 Jaw pain: Secondary | ICD-10-CM | POA: Diagnosis not present

## 2019-06-08 DIAGNOSIS — M542 Cervicalgia: Secondary | ICD-10-CM | POA: Diagnosis not present

## 2019-06-12 ENCOUNTER — Ambulatory Visit: Payer: Medicare PPO | Admitting: Sports Medicine

## 2019-06-12 ENCOUNTER — Other Ambulatory Visit: Payer: Self-pay

## 2019-06-12 VITALS — BP 108/74 | Ht 64.5 in | Wt 179.0 lb

## 2019-06-12 DIAGNOSIS — M170 Bilateral primary osteoarthritis of knee: Secondary | ICD-10-CM | POA: Diagnosis not present

## 2019-06-12 MED ORDER — METHYLPREDNISOLONE ACETATE 40 MG/ML IJ SUSP
40.0000 mg | Freq: Once | INTRAMUSCULAR | Status: AC
  Administered 2019-06-12: 40 mg via INTRA_ARTICULAR

## 2019-06-13 DIAGNOSIS — M542 Cervicalgia: Secondary | ICD-10-CM | POA: Diagnosis not present

## 2019-06-13 DIAGNOSIS — R6884 Jaw pain: Secondary | ICD-10-CM | POA: Diagnosis not present

## 2019-06-13 DIAGNOSIS — M26601 Right temporomandibular joint disorder, unspecified: Secondary | ICD-10-CM | POA: Diagnosis not present

## 2019-06-13 NOTE — Progress Notes (Signed)
Patient ID: Renee Young, female   DOB: January 15, 1946, 74 y.o.   MRN: [id number]  Patient comes in today requesting repeat cortisone injections into each knee.  Since her last office visit with me, she has been evaluated by Dr. Lequita Halt.  He agrees that she needs bilateral total knee arthroplasties and she is planning on proceeding with the left knee in the fall.  She has an upcoming trip to [place] in a week or so.  She has done well with cortisone injections in the past.  She denies any recent trauma.  Each knee was injected today with cortisone.  An anterior lateral approach was utilized.  She tolerates this without difficulty.  She understands if this is probably the last cortisone injection that she will be able to get before her arthroplasty as the surgeon has recommended that she receive no knee injection of any kind 3 months prior to surgery.  Consent obtained and verified. Time-out conducted. Noted no overlying erythema, induration, or other signs of local infection. Skin prepped in a sterile fashion. Topical analgesic spray: Ethyl chloride. Joint: right knee Needle: 25g 1.5 inch Completed without difficulty. Meds: 3cc 1% xylocaine, 1cc (40mg ) depomedrol  Advised to call if fevers/chills, erythema, induration, drainage, or persistent bleeding.  Consent obtained and verified. Time-out conducted. Noted no overlying erythema, induration, or other signs of local infection. Skin prepped in a sterile fashion. Topical analgesic spray: Ethyl chloride. Joint: left knee Needle: 25g 1.5 inch Completed without difficulty. Meds: 3cc 1% xylocaine, 1cc (40mg ) depomedrol  Advised to call if fevers/chills, erythema, induration, drainage, or persistent bleeding.

## 2019-06-14 DIAGNOSIS — M542 Cervicalgia: Secondary | ICD-10-CM | POA: Diagnosis not present

## 2019-06-14 DIAGNOSIS — R6884 Jaw pain: Secondary | ICD-10-CM | POA: Diagnosis not present

## 2019-06-14 DIAGNOSIS — M26601 Right temporomandibular joint disorder, unspecified: Secondary | ICD-10-CM | POA: Diagnosis not present

## 2019-06-18 DIAGNOSIS — R6884 Jaw pain: Secondary | ICD-10-CM | POA: Diagnosis not present

## 2019-06-18 DIAGNOSIS — M542 Cervicalgia: Secondary | ICD-10-CM | POA: Diagnosis not present

## 2019-06-18 DIAGNOSIS — M26601 Right temporomandibular joint disorder, unspecified: Secondary | ICD-10-CM | POA: Diagnosis not present

## 2019-06-20 DIAGNOSIS — M542 Cervicalgia: Secondary | ICD-10-CM | POA: Diagnosis not present

## 2019-06-20 DIAGNOSIS — R6884 Jaw pain: Secondary | ICD-10-CM | POA: Diagnosis not present

## 2019-06-20 DIAGNOSIS — M26601 Right temporomandibular joint disorder, unspecified: Secondary | ICD-10-CM | POA: Diagnosis not present

## 2019-06-25 ENCOUNTER — Telehealth: Payer: Self-pay | Admitting: Internal Medicine

## 2019-06-25 NOTE — Telephone Encounter (Signed)
Attempted to schedule AWV. Unable to LVM.  Will try at later time. No voicemail setup 

## 2019-07-11 DIAGNOSIS — M26601 Right temporomandibular joint disorder, unspecified: Secondary | ICD-10-CM | POA: Diagnosis not present

## 2019-07-11 DIAGNOSIS — M542 Cervicalgia: Secondary | ICD-10-CM | POA: Diagnosis not present

## 2019-07-11 DIAGNOSIS — R6884 Jaw pain: Secondary | ICD-10-CM | POA: Diagnosis not present

## 2019-07-20 ENCOUNTER — Ambulatory Visit: Payer: Medicare PPO | Admitting: Internal Medicine

## 2019-09-06 DIAGNOSIS — F902 Attention-deficit hyperactivity disorder, combined type: Secondary | ICD-10-CM | POA: Diagnosis not present

## 2019-09-06 DIAGNOSIS — F411 Generalized anxiety disorder: Secondary | ICD-10-CM | POA: Diagnosis not present

## 2019-09-06 DIAGNOSIS — F332 Major depressive disorder, recurrent severe without psychotic features: Secondary | ICD-10-CM | POA: Diagnosis not present

## 2019-09-12 ENCOUNTER — Telehealth: Payer: Self-pay | Admitting: Internal Medicine

## 2019-09-12 NOTE — Telephone Encounter (Signed)
Copied from CRM [id number]. Topic: Medicare AWV >> Sep 12, 2019  3:42 PM Claudette Laws R wrote: Reason for CRM:   No answer - unable to leave message for patient to call back and schedule the Medicare Annual Wellness Visit (AWV) in office or virtual  Last AWV 12/24/2016  Please schedule at anytime with [facility] Health Advisor.  40 minute appointment

## 2019-09-21 NOTE — Progress Notes (Signed)
Patient ID: Renee Young, female    DOB: 08-Aug-1945, 74 y.o.   MRN: [id number]  PCP: Jamelle Haring, MD  Chief Complaint  Patient presents with  . Follow-up    having knee replacement in surgery August 23    Subjective:   Renee Young is a 74 y.o. female, presents to clinic with CC of the following:  Chief Complaint  Patient presents with  . Follow-up    having knee replacement in surgery August 23    HPI:  Patient is a 74 year old female Last visit with me was in April 2021 Communication attempted after her labs were obtained that visit was as follows:  Your hemoglobin A1c was higher at 7.1, with this reading is consistent with diabetes.  I do think we should discuss starting a medication for this.  (Metformin) Your lipid panel remains very abnormal, and I do think a medication (a statin) would be very helpful to start. Your complete blood count showed that you are not anemic, although the size of the red cells and the hemoglobin content of the red cells were slightly low, and this is not a new finding comparing this to prior complete blood counts.  This may be related to an iron deficiency concern, and I would recommend checking iron studies at this point. The complete metabolic panel and the thyroid screen (TSH) were normal. I would like to have a follow-up visit to discuss the above, including potentially adding a couple new medications as noted above and will ask Melissa to help get a follow-up visit set up to further discuss. Unfortunately, a follow-up visit was not able to be scheduled sooner than today, despite several attempts. She notes that she was overseas, and [place] for 4 weeks after our last visit, visiting her daughter there.  That was in the June timeframe. She follows up today.  Of note, she has a total knee replacement on 10/08/2019 scheduled  Prediabetes/diabetes Current medication regimen-none  Discussed the Metformin product to initiate, and  she noted she had been on that in the past and was okay with doing so. Reviewed the last labs with her today, with the elevated A1c noted Lab Results  Component Value Date   HGBA1C 7.1 (H) 05/30/2019   HGBA1C 6.7 (H) 12/24/2016   HGBA1C 6.5 01/06/2015   Lab Results  Component Value Date   MICROALBUR 1.9 01/07/2014   LDLCALC 195 (H) 05/30/2019   CREATININE 0.72 05/30/2019   Last eye exam- July 2021 Not on an ACE Not on a statin Denies increased thirst, increased urination, no numbness or tingling in the extremities  Hyperlipidemia Medication regimen-none Previously, a statin was entertained due to high readings..She noted she is often averse to medicines and prefers to try other ways to manage if possible.  I did note that I would recommend a statin as well if her numbers returned similar on recheck, and she noted she would be open to trying a statin, and I did encourage that if the numbers remain similar. Strongly advocated for this again today, especially after reviewing the labs,, and she was hesitant, and noted she would like to start after her surgery.  I did note it would not be problematic to start before, and did put the prescription through to the pharmacy today.  Lab Results  Component Value Date   CHOL 278 (H) 05/30/2019   HDL 38 (L) 05/30/2019   LDLCALC 195 (H) 05/30/2019   LDLDIRECT 222.0 03/07/2009  TRIG 252 (H) 05/30/2019   CHOLHDL 7.3 (H) 05/30/2019   Denies any recent chest pains, palpitations, shortness of breath, increased headaches, increased lower extremity swelling, vision changes.  She did note one morning when she was up and about before she had breakfast, feeling like she was going to pass out, and once again and ate and had fluids, felt much better.  That has only happened once in the last months.  Hypochromic,microcytic (not anemic) - mild Lab Results  Component Value Date   WBC 5.3 05/30/2019   HGB 13.1 05/30/2019   HCT 41.2 05/30/2019   MCV 78.5  (L) 05/30/2019   PLT 331 05/30/2019   Discussed this with her today as well, with her not being anemic. Does have the colon polyp history noted.              ADD - on stimulant.             Pt does reports daytime somnolence but states Adderall helps with this which is prescribed by Don[facility] at [facility].  She noted she really cannot function well without the Adderall.  Noted some concerns with a stimulant medicine, especially at her age and the concerning side effects from these, although she notes she tolerates it well. She will continue to obtain this prescription through her psychologist, as felt needed. She denies any palpitations, racing heart, and her blood pressure has always been okay while taking this. I did note concerns with this medicine, especially how it can affect her heart over time with chronic use and ideally trying to lessen its use over time would be a good goal.  Depression Medication regimen-Prozac.   She notes she has not had major depression concerns, and that the Prozac was added to help in some respects while taking the Adderall product. She does see a counselor who prescribes this for her in addition to the Adderall product. PHQ 9 reviewed today   CA Screening Colonoscopy:12/2013, repeat in 3-5 years recommended, + polyps, would like not to do when discussed getting a repeat colonoscopy,  no black stools, BRBPR, no abdominal pains Cervical CA screening: last pap smear was ~15 years ago; always normal Breast CA Screening: last mammogram 04/17/19, no evidence of malignancy   She did have the Covid vaccine    Patient Active Problem List   Diagnosis Date Noted  . Hypochromic erythrocytes 09/24/2019  . Microcytic erythrocytes 09/24/2019  . Prediabetes 05/30/2019  . Family history of thyroid disease 05/30/2019  . Primary osteoarthritis of both knees 05/30/2019  . Bilateral temporomandibular joint pain 05/30/2019  . History of colonic  polyps 12/17/2016  . Varicose veins of bilateral lower extremities with other complications 07/03/2012  . Pain in limb 07/03/2012  . DJD (degenerative joint disease) of knee 03/21/2009  . Type 2 diabetes mellitus with hyperglycemia, without long-term current use of insulin (HCC) 07/19/2007  . Mixed hyperlipidemia 07/19/2007  . DEPRESSION 07/19/2007  . ATTENTION DEFICIT DISORDER 07/19/2007      Current Outpatient Medications:  .  amphetamine-dextroamphetamine (ADDERALL) 20 MG tablet, Take 20 mg by mouth daily. , Disp: , Rfl: 0 .  cholecalciferol (VITAMIN D) 1000 units tablet, Take 1,000 Units by mouth daily., Disp: , Rfl:  .  FLUoxetine (PROZAC) 20 MG capsule, Take 40 mg by mouth at bedtime. , Disp: , Rfl:  .  Multiple Vitamin (MULTIVITAMIN) tablet, Take 1 tablet by mouth daily. , Disp: , Rfl:    Allergies  Allergen Reactions  . Nitrofurantoin  Hives and Swelling  . Penicillins Swelling     Past Surgical History:  Procedure Laterality Date  . CESAREAN SECTION    . TUBAL LIGATION    . URETEROCELE INCISION       Family History  Problem Relation Age of Onset  . Cancer Mother   . Kidney disease Mother   . Diabetes Father   . Heart disease Father   . Hyperlipidemia Father   . Hypertension Father   . Stroke Father   . Arthritis Sister   . Obesity Brother   . Arthritis Brother   . Heart disease Maternal Grandmother   . Stroke Maternal Grandmother   . Hypertension Maternal Grandmother   . Heart disease Maternal Grandfather   . Hyperlipidemia Maternal Grandfather   . Stroke Paternal Grandmother   . Heart disease Paternal Grandmother   . Heart disease Paternal Grandfather   . Heart attack Paternal Grandfather   . Arthritis Daughter      Social History   Tobacco Use  . Smoking status: Never Smoker  . Smokeless tobacco: Never Used  Substance Use Topics  . Alcohol use: Yes    Alcohol/week: 2.0 standard drinks    Types: 2 Glasses of wine per week    With staff  assistance, above reviewed with the patient today.  ROS: As per HPI, otherwise no specific complaints on a limited and focused system review   No results found for this or any previous visit (from the past 72 hour(s)).   PHQ2/9: Depression screen [facility] 2/9 09/25/2019 05/30/2019 04/20/2017 01/11/2017 12/24/2016  Decreased Interest 0 0 0 0 1  Down, Depressed, Hopeless 0 0 0 0 0  PHQ - 2 Score 0 0 0 0 1  Altered sleeping 0 0 - - -  Tired, decreased energy 0 0 - - -  Change in appetite 0 0 - - -  Feeling bad or failure about yourself  0 0 - - -  Trouble concentrating 0 0 - - -  Moving slowly or fidgety/restless 0 0 - - -  Suicidal thoughts 0 0 - - -  PHQ-9 Score 0 0 - - -  Difficult doing work/chores Not difficult at all Not difficult at all - - -   PHQ-2/9 Result is neg Fall Risk: Fall Risk  09/25/2019 05/30/2019 04/20/2017 01/11/2017 12/24/2016  Falls in the past year? 1 1 Yes Yes Yes  Comment - - fell on Sunday, 04/17/17 - -  Number falls in past yr: 1 0 - 1 2 or more  Comment - - - - APPX 5  Injury with Fall? 0 0 - No No  Comment - - - bruises minor      Objective:   Vitals:   09/25/19 1001  BP: 138/84  Pulse: (!) 114  Resp: 16  Temp: 98.3 F (36.8 C)  TempSrc: Oral  SpO2: 98%  Weight: 177 lb 4.8 oz (80.4 kg)  Height:  (1.651 m)    Body mass index is 29.5 kg/m.  Physical Exam    NAD, masked, pleasant HEENT - /AT, positive glasses, sclera anicteric, PERRL, EOMI, conj - non-inj'ed, pharynx clear Neck - supple, no adenopathy, carotids 2+ and = without bruits bilat Car -mildly tachycardic and regular without m/g/r Pulm- RR and effort normal at rest, CTA without wheeze or rales Abd - soft, obese, diffusely NT, ND Back - no CVA tenderness Ext - no LE edema, increased varicosities noted, most concentrated in the left ankle region, much milder on  the right.   Neuro/psychiatric - affect was not flat, appropriate with conversation             Alert and oriented              Grossly non-focal - good strength in the extremities, sensation intact to LT in distal extremities             Speech normal   Results for orders placed or performed in visit on 05/30/19  Hemoglobin A1c  Result Value Ref Range   Hgb A1c MFr Bld 7.1 (H) <5.7 % of total Hgb   Mean Plasma Glucose 157 (calc)   eAG (mmol/L) 8.7 (calc)  COMPLETE METABOLIC PANEL WITH GFR  Result Value Ref Range   Glucose, Bld 93 65 - 99 mg/dL   BUN 17 7 - 25 mg/dL   Creat 9.20 1.00 - 7.12 mg/dL   GFR, Est Non African American 83 > OR = 60 mL/min/1.28m2   GFR, Est African American 96 > OR = 60 mL/min/1.19m2   BUN/Creatinine Ratio NOT APPLICABLE 6 - 22 (calc)   Sodium 140 135 - 146 mmol/L   Potassium 4.2 3.5 - 5.3 mmol/L   Chloride 105 98 - 110 mmol/L   CO2 30 20 - 32 mmol/L   Calcium 9.8 8.6 - 10.4 mg/dL   Total Protein 6.9 6.1 - 8.1 g/dL   Albumin 4.0 3.6 - 5.1 g/dL   Globulin 2.9 1.9 - 3.7 g/dL (calc)   AG Ratio 1.4 1.0 - 2.5 (calc)   Total Bilirubin 0.4 0.2 - 1.2 mg/dL   Alkaline phosphatase (APISO) 90 37 - 153 U/L   AST 15 10 - 35 U/L   ALT 16 6 - 29 U/L  CBC with Differential/Platelet  Result Value Ref Range   WBC 5.3 3.8 - 10.8 Thousand/uL   RBC 5.25 (H) 3.80 - 5.10 Million/uL   Hemoglobin 13.1 11.7 - 15.5 g/dL   HCT 19.7 35 - 45 %   MCV 78.5 (L) 80.0 - 100.0 fL   MCH 25.0 (L) 27.0 - 33.0 pg   MCHC 31.8 (L) 32.0 - 36.0 g/dL   RDW 58.8 (H) 32.5 - 49.8 %   Platelets 331 140 - 400 Thousand/uL   MPV 9.2 7.5 - 12.5 fL   Neutro Abs 3,254 1,500 - 7,800 cells/uL   Lymphs Abs 1,214 850 - 3,900 cells/uL   Absolute Monocytes 546 200 - 950 cells/uL   Eosinophils Absolute 239 15 - 500 cells/uL   Basophils Absolute 48 0 - 200 cells/uL   Neutrophils Relative % 61.4 %   Total Lymphocyte 22.9 %   Monocytes Relative 10.3 %   Eosinophils Relative 4.5 %   Basophils Relative 0.9 %  Lipid panel  Result Value Ref Range   Cholesterol 278 (H) <200 mg/dL   HDL 38 (L) > OR = 50 mg/dL   Triglycerides 264  (H) <150 mg/dL   LDL Cholesterol (Calc) 195 (H) mg/dL (calc)   Total CHOL/HDL Ratio 7.3 (H) <5.0 (calc)   Non-HDL Cholesterol (Calc) 240 (H) <130 mg/dL (calc)  TSH  Result Value Ref Range   TSH 4.07 0.40 - 4.50 mIU/L   Reviewed the last lab results with her today.    Assessment & Plan:    1. Type 2 diabetes mellitus with hyperglycemia, without long-term current use of insulin (HCC) Noted with her last A1c at 7.1, this is consistent with a diagnosis of diabetes. Do feel starting a medicine like Metformin is indicated,  and recommended 500 mg twice daily to start.  Take with food. She noted she had been on this previously, and was okay with starting. We will recheck an A1c today, as she notes it has to be below 7.5 in order to have the surgery per her surgeon. Also will check a urine for microalbumin. Information provided in the AVS on this as well. - metFORMIN (GLUCOPHAGE) 500 MG tablet; Take 1 tablet (500 mg total) by mouth 2 (two) times daily with a meal.  Dispense: 180 tablet; Refill: 3 - Hemoglobin A1c - Microalbumin / creatinine urine ratio  2. Mixed hyperlipidemia Her lipid panel was markedly abnormal, and strongly advocated to start a statin and the reasons behind that. She was hesitant to do so today, and states she would do so after the surgery. Noted can start even before the surgery, and the importance of this medicine and helping prevent future cardiac events, and medicine was ordered for her to be at her pharmacy. Dietary modifications also important in combination. Information provided in the AVS on this as well. - atorvastatin (LIPITOR) 20 MG tablet; Take 1 tablet (20 mg total) by mouth daily.  Dispense: 90 tablet; Refill: 3  3. Hypochromic erythrocytes 4. Microcytic erythrocytes Educated her on this today, and it is mild and she is not anemic. We will check her iron status, and she was okay with that. She notes she does eat meat and does not have major dietary  restrictions. Discussed if she is iron deficient, would strongly consider getting a follow-up colonoscopy which she has not wanted to pursue in the recent past after the distant 1 with polyps found. Await this result presently. - Iron, TIBC and Ferritin Panel - CBC with Differential/Platelet  5. Attention deficit disorder (ADD) without hyperactivity 6. Other depression Continues to be followed by an outside provider, who provides her prescriptions, and do have concerns with continued Adderall use presently.  Do feel it is contributing to her tachycardia to some extent. Trying to convince her to lessen this medicine over time is challenging.  7. History of colon polyps As above, did not want to have a follow-up colonoscopy as was recommended.  If she is iron deficient, strongly would recommend that, and noted that to her today.  8.  Tachycardia/one episode of a near syncopal event Do feel she does not stay well-hydrated, as the only fluids she had before our visit today was a cup of tea.  Noted to her that caffeinated products and tea products are not helpful with hydration, and the importance of drinking water and other fluids routinely throughout the day emphasized.  She agreed she does not do well with hydration in general, and with these warm weather months especially, the importance of this was emphasized today and do feel that contributed to her episode of near passing out.   Await lab results, and tentatively scheduled to follow-up in about 3 months after her surgery, about 4 months from today, and follow-up sooner as needed.    Jamelle HaringLIFFORD D Maeleigh Buschman, MD 09/25/19 10:17 AM

## 2019-09-24 DIAGNOSIS — D508 Other iron deficiency anemias: Secondary | ICD-10-CM | POA: Insufficient documentation

## 2019-09-24 DIAGNOSIS — R718 Other abnormality of red blood cells: Secondary | ICD-10-CM | POA: Insufficient documentation

## 2019-09-25 ENCOUNTER — Ambulatory Visit (INDEPENDENT_AMBULATORY_CARE_PROVIDER_SITE_OTHER): Payer: Medicare PPO | Admitting: Internal Medicine

## 2019-09-25 ENCOUNTER — Other Ambulatory Visit: Payer: Self-pay

## 2019-09-25 ENCOUNTER — Encounter: Payer: Self-pay | Admitting: Internal Medicine

## 2019-09-25 VITALS — BP 138/84 | HR 114 | Temp 98.3°F | Resp 16 | Ht 65.0 in | Wt 177.3 lb

## 2019-09-25 DIAGNOSIS — Z86010 Personal history of colonic polyps: Secondary | ICD-10-CM | POA: Diagnosis not present

## 2019-09-25 DIAGNOSIS — E782 Mixed hyperlipidemia: Secondary | ICD-10-CM | POA: Diagnosis not present

## 2019-09-25 DIAGNOSIS — F988 Other specified behavioral and emotional disorders with onset usually occurring in childhood and adolescence: Secondary | ICD-10-CM

## 2019-09-25 DIAGNOSIS — E1165 Type 2 diabetes mellitus with hyperglycemia: Secondary | ICD-10-CM

## 2019-09-25 DIAGNOSIS — R000 Tachycardia, unspecified: Secondary | ICD-10-CM | POA: Diagnosis not present

## 2019-09-25 DIAGNOSIS — F3289 Other specified depressive episodes: Secondary | ICD-10-CM

## 2019-09-25 DIAGNOSIS — R718 Other abnormality of red blood cells: Secondary | ICD-10-CM

## 2019-09-25 DIAGNOSIS — D508 Other iron deficiency anemias: Secondary | ICD-10-CM | POA: Diagnosis not present

## 2019-09-25 MED ORDER — METFORMIN HCL 500 MG PO TABS: 500.0000 mg | ORAL_TABLET | Freq: Two times a day (BID) | ORAL | 3 refills | Status: DC

## 2019-09-25 MED ORDER — ATORVASTATIN CALCIUM 20 MG PO TABS: 20.0000 mg | ORAL_TABLET | Freq: Every day | ORAL | 3 refills | Status: DC

## 2019-09-25 NOTE — Patient Instructions (Addendum)
DUE TO COVID-19 ONLY ONE VISITOR IS ALLOWED TO COME WITH YOU AND STAY IN THE WAITING ROOM ONLY DURING PRE OP AND PROCEDURE DAY OF SURGERY. THE 1 VISITOR  MAY VISIT WITH YOU AFTER SURGERY IN YOUR PRIVATE ROOM DURING VISITING HOURS ONLY!  YOU NEED TO HAVE A COVID 19 TEST ON: 10/04/19 @  11:00 am , THIS TEST MUST BE DONE BEFORE SURGERY,  COVID TESTING SITE [address] [place] [address], IT IS ON THE RIGHT GOING OUT [address] APPROXIMATELY  2 MINUTES PAST [employer] ON THE RIGHT. ONCE YOUR COVID TEST IS COMPLETED,  PLEASE BEGIN THE QUARANTINE INSTRUCTIONS AS OUTLINED IN YOUR HANDOUT.                Renee Young   Your procedure is scheduled on: 10/08/19   Report to [facility] Main  Entrance   Report to SHORT STAY at: 5:30 AM     Call this number if you have problems the morning of surgery [phone]    Remember:   NO SOLID FOOD AFTER MIDNIGHT THE NIGHT PRIOR TO SURGERY. NOTHING BY MOUTH EXCEPT CLEAR LIQUIDS UNTIL: 4:30 am . PLEASE FINISH GATORADE DRINK PER SURGEON ORDER  WHICH NEEDS TO BE COMPLETED AT: 4:30 AM.  CLEAR LIQUID DIET   Foods Allowed                                                                     Foods Excluded  Coffee and tea, regular and decaf                             liquids that you cannot  Plain Jell-O any favor except red or purple                                           see through such as: Fruit ices (not with fruit pulp)                                     milk, soups, orange juice  Iced Popsicles                                    All solid food Carbonated beverages, regular and diet                                    Cranberry, grape and apple juices Sports drinks like Gatorade Lightly seasoned clear broth or consume(fat free) Sugar, honey syrup  Sample Menu Breakfast                                Lunch  Supper Cranberry juice                    Beef broth                             Chicken broth Jell-O                                     Grape juice                           Apple juice Coffee or tea                        Jell-O                                      Popsicle                                                Coffee or tea                        Coffee or tea  _____________________________________________________________________  BRUSH YOUR TEETH MORNING OF SURGERY AND RINSE YOUR MOUTH OUT, NO CHEWING GUM CANDY OR MINTS.    How to Manage Your Diabetes Before and After Surgery  Why is it important to control my blood sugar before and after surgery? . Improving blood sugar levels before and after surgery helps healing and can limit problems. . A way of improving blood sugar control is eating a healthy diet by: o  Eating less sugar and carbohydrates o  Increasing activity/exercise o  Talking with your doctor about reaching your blood sugar goals . High blood sugars (greater than 180 mg/dL) can raise your risk of infections and slow your recovery, so you will need to focus on controlling your diabetes during the weeks before surgery. . Make sure that the doctor who takes care of your diabetes knows about your planned surgery including the date and location.  How do I manage my blood sugar before surgery? . Check your blood sugar at least 4 times a day, starting 2 days before surgery, to make sure that the level is not too high or low. o Check your blood sugar the morning of your surgery when you wake up and every 2 hours until you get to the Short Stay unit. . If your blood sugar is less than 70 mg/dL, you will need to treat for low blood sugar: o Do not take insulin. o Treat a low blood sugar (less than 70 mg/dL) with  cup of clear juice (cranberry or apple), 4 glucose tablets, OR glucose gel. o Recheck blood sugar in 15 minutes after treatment (to make sure it is greater than 70 mg/dL). If your blood sugar is not greater than 70 mg/dL on recheck,  call [phone] for further instructions. . Report your blood sugar to the short stay nurse when you get to Short Stay.  . If you are admitted to the hospital after surgery: o Your blood sugar will be checked by the  staff and you will probably be given insulin after surgery (instead of oral diabetes medicines) to make sure you have good blood sugar levels. o The goal for blood sugar control after surgery is 80-180 mg/dL.   WHAT DO I DO ABOUT MY DIABETES MEDICATION?  Marland Kitchen Do not take oral diabetes medicines (pills) the morning of surgery.  . THE DAY BEFORE SURGERY, take Metformin as usual.       . THE MORNING OF SURGERY, DO NOT take Metformin.  DO NOT TAKE ANY DIABETIC MEDICATIONS DAY OF YOUR SURGERY                               You may not have any metal on your body including hair pins and              piercings  Do not wear jewelry, make-up, lotions, powders or perfumes, deodorant             Do not wear nail polish on your fingernails.  Do not shave  48 hours prior to surgery.               Do not bring valuables to the hospital. [facility] IS NOT             RESPONSIBLE   FOR VALUABLES.  Contacts, dentures or bridgework may not be worn into surgery.  Leave suitcase in the car. After surgery it may be brought to your room.     Patients discharged the day of surgery will not be allowed to drive home. IF YOU ARE HAVING SURGERY AND GOING HOME THE SAME DAY, YOU MUST HAVE AN ADULT TO DRIVE YOU HOME AND BE WITH YOU FOR 24 HOURS. YOU MAY GO HOME BY TAXI OR UBER OR ORTHERWISE, BUT AN ADULT MUST ACCOMPANY YOU HOME AND STAY WITH YOU FOR 24 HOURS.  Name and phone number of your driver:  Special Instructions: N/A              Please read over the following fact sheets you were given: _____________________________________________________________________         [facility] - Preparing for Surgery Before surgery, you can play an important role.  Because skin is not sterile, your skin needs  to be as free of germs as possible.  You can reduce the number of germs on your skin by washing with CHG (chlorahexidine gluconate) soap before surgery.  CHG is an antiseptic cleaner which kills germs and bonds with the skin to continue killing germs even after washing. Please DO NOT use if you have an allergy to CHG or antibacterial soaps.  If your skin becomes reddened/irritated stop using the CHG and inform your nurse when you arrive at Short Stay. Do not shave (including legs and underarms) for at least 48 hours prior to the first CHG shower.  You may shave your face/neck. Please follow these instructions carefully:  1.  Shower with CHG Soap the night before surgery and the  morning of Surgery.  2.  If you choose to wash your hair, wash your hair first as usual with your  normal  shampoo.  3.  After you shampoo, rinse your hair and body thoroughly to remove the  shampoo.                           4.  Use CHG as you would any other  liquid soap.  You can apply chg directly  to the skin and wash                       Gently with a scrungie or clean washcloth.  5.  Apply the CHG Soap to your body ONLY FROM THE NECK DOWN.   Do not use on face/ open                           Wound or open sores. Avoid contact with eyes, ears mouth and genitals (private parts).                       Wash face,  Genitals (private parts) with your normal soap.             6.  Wash thoroughly, paying special attention to the area where your surgery  will be performed.  7.  Thoroughly rinse your body with warm water from the neck down.  8.  DO NOT shower/wash with your normal soap after using and rinsing off  the CHG Soap.                9.  Pat yourself dry with a clean towel.            10.  Wear clean pajamas.            11.  Place clean sheets on your bed the night of your first shower and do not  sleep with pets. Day of Surgery : Do not apply any lotions/deodorants the morning of surgery.  Please wear clean clothes  to the hospital/surgery center.  FAILURE TO FOLLOW THESE INSTRUCTIONS MAY RESULT IN THE CANCELLATION OF YOUR SURGERY PATIENT SIGNATURE_________________________________  NURSE SIGNATURE__________________________________  ________________________________________________________________________   Rogelia Mire  An incentive spirometer is a tool that can help keep your lungs clear and active. This tool measures how well you are filling your lungs with each breath. Taking long deep breaths may help reverse or decrease the chance of developing breathing (pulmonary) problems (especially infection) following:  A long period of time when you are unable to move or be active. BEFORE THE PROCEDURE   If the spirometer includes an indicator to show your best effort, your nurse or respiratory therapist will set it to a desired goal.  If possible, sit up straight or lean slightly forward. Try not to slouch.  Hold the incentive spirometer in an upright position. INSTRUCTIONS FOR USE  1. Sit on the edge of your bed if possible, or sit up as far as you can in bed or on a chair. 2. Hold the incentive spirometer in an upright position. 3. Breathe out normally. 4. Place the mouthpiece in your mouth and seal your lips tightly around it. 5. Breathe in slowly and as deeply as possible, raising the piston or the ball toward the top of the column. 6. Hold your breath for 3-5 seconds or for as long as possible. Allow the piston or ball to fall to the bottom of the column. 7. Remove the mouthpiece from your mouth and breathe out normally. 8. Rest for a few seconds and repeat Steps 1 through 7 at least 10 times every 1-2 hours when you are awake. Take your time and take a few normal breaths between deep breaths. 9. The spirometer may include an indicator to show your best effort. Use the indicator as a goal  to work toward during each repetition. 10. After each set of 10 deep breaths, practice coughing to be  sure your lungs are clear. If you have an incision (the cut made at the time of surgery), support your incision when coughing by placing a pillow or rolled up towels firmly against it. Once you are able to get out of bed, walk around indoors and cough well. You may stop using the incentive spirometer when instructed by your caregiver.  RISKS AND COMPLICATIONS  Take your time so you do not get dizzy or light-headed.  If you are in pain, you may need to take or ask for pain medication before doing incentive spirometry. It is harder to take a deep breath if you are having pain. AFTER USE  Rest and breathe slowly and easily.  It can be helpful to keep track of a log of your progress. Your caregiver can provide you with a simple table to help with this. If you are using the spirometer at home, follow these instructions: SEEK MEDICAL CARE IF:   You are having difficultly using the spirometer.  You have trouble using the spirometer as often as instructed.  Your pain medication is not giving enough relief while using the spirometer.  You develop fever of 100.5 F (38.1 C) or higher. SEEK IMMEDIATE MEDICAL CARE IF:   You cough up bloody sputum that had not been present before.  You develop fever of 102 F (38.9 C) or greater.  You develop worsening pain at or near the incision site. MAKE SURE YOU:   Understand these instructions.  Will watch your condition.  Will get help right away if you are not doing well or get worse. Document Released: 06/14/2006 Document Revised: 04/26/2011 Document Reviewed: 08/15/2006 [facility] Patient Information 2014 [place], [place].   ________________________________________________________________________

## 2019-09-25 NOTE — Patient Instructions (Signed)
Type 2 Diabetes Mellitus, Self Care, Adult When you have type 2 diabetes (type 2 diabetes mellitus), you must make sure your blood sugar (glucose) stays in a healthy range. You can do this with:  Nutrition.  Exercise.  Lifestyle changes.  Medicines or insulin, if needed.  Support from your doctors and others. How to stay aware of blood sugar   Check your blood sugar level every day, as often as told.  Have your A1c (hemoglobin A1c) level checked two or more times a year. Have it checked more often if your doctor tells you to. Your doctor will set personal treatment goals for you. Generally, you should have these blood sugar levels:  Before meals (preprandial): 80-130 mg/dL (4.4-7.2 mmol/L).  After meals (postprandial): below 180 mg/dL (10 mmol/L).  A1c level: less than 7%. How to manage high and low blood sugar Signs of high blood sugar High blood sugar is called hyperglycemia. Know the signs of high blood sugar. Signs may include:  Feeling: ? Thirsty. ? Hungry. ? Very tired.  Needing to pee (urinate) more than usual.  Blurry vision. Signs of low blood sugar Low blood sugar is called hypoglycemia. This is when blood sugar is at or below 70 mg/dL (3.9 mmol/L). Signs may include:  Feeling: ? Hungry. ? Worried or nervous (anxious). ? Sweaty and clammy. ? Confused. ? Dizzy. ? Sleepy. ? Sick to your stomach (nauseous).  Having: ? A fast heartbeat. ? A headache. ? A change in your vision. ? Jerky movements that you cannot control (seizure). ? Tingling or no feeling (numbness) around your mouth, lips, or tongue.  Having trouble with: ? Moving (coordination). ? Sleeping. ? Passing out (fainting). ? Getting upset easily (irritability). Treating low blood sugar To treat low blood sugar, eat or drink something sugary right away. If you can think clearly and swallow safely, follow the 15:15 rule:  Take 15 grams of a fast-acting carb (carbohydrate). Talk with your  doctor about how much you should take.  Some fast-acting carbs are: ? Sugar tablets (glucose pills). Take 3-4 pills. ? 6-8 pieces of hard candy. ? 4-6 oz (120-150 mL) of fruit juice. ? 4-6 oz (120-150 mL) of regular (not diet) soda. ? 1 Tbsp (15 mL) honey or sugar.  Check your blood sugar 15 minutes after you take the carb.  If your blood sugar is still at or below 70 mg/dL (3.9 mmol/L), take 15 grams of a carb again.  If your blood sugar does not go above 70 mg/dL (3.9 mmol/L) after 3 tries, get help right away.  After your blood sugar goes back to normal, eat a meal or a snack within 1 hour. Treating very low blood sugar If your blood sugar is at or below 54 mg/dL (3 mmol/L), you have very low blood sugar (severe hypoglycemia). This is an emergency. Do not wait to see if the symptoms will go away. Get medical help right away. Call your local emergency services (911 in the [place] If you have very low blood sugar and you cannot eat or drink, you may need a glucagon shot (injection). A family member or friend should learn how to check your blood sugar and how to give you a glucagon shot. Ask your doctor if you need to have a glucagon shot kit at home. Follow these instructions at home: Medicine  Take insulin and diabetes medicines as told.  If your doctor says you should take more or less insulin and medicines, do this exactly as told.  Do not run out of insulin or medicines. Having diabetes can raise your risk for other long-term conditions. These include heart disease and kidney disease. Your doctor may prescribe medicines to help you not have these problems. Food   Make healthy food choices. These include: ? Chicken, fish, egg whites, and beans. ? Oats, whole wheat, bulgur, brown rice, quinoa, and millet. ? Fresh fruits and vegetables. ? Low-fat dairy products. ? Nuts, avocado, olive oil, and canola oil.  Meet with a food specialist (dietitian). He or she can help you make an  eating plan that is right for you.  Follow instructions from your doctor about what you cannot eat or drink.  Drink enough fluid to keep your pee (urine) pale yellow.  Keep track of carbs that you eat. Do this by reading food labels and learning food serving sizes.  Follow your sick day plan when you cannot eat or drink normally. Make this plan with your doctor so it is ready to use. Activity  Exercise 3 or more times a week.  Do not go more than 2 days without exercising.  Talk with your doctor before you start a new exercise. Your doctor may need to tell you to change: ? How much insulin or medicines you take. ? How much food you eat. Lifestyle  Do not use any tobacco products. These include cigarettes, chewing tobacco, and e-cigarettes. If you need help quitting, ask your doctor.  Ask your doctor how much alcohol is safe for you.  Learn to deal with stress. If you need help with this, ask your doctor. Body care   Stay up to date with your shots (immunizations).  Have your eyes and feet checked by a doctor as often as told.  Check your skin and feet every day. Check for cuts, bruises, redness, blisters, or sores.  Brush your teeth and gums two times a day. Floss one or more times a day.  Go to the dentist one or more times every 6 months.  Stay at a healthy weight. General instructions  Take over-the-counter and prescription medicines only as told by your doctor.  Share your diabetes care plan with: ? Your work or school. ? People you live with.  Carry a card or wear jewelry that says you have diabetes.  Keep all follow-up visits as told by your doctor. This is important. Questions to ask your doctor  Do I need to meet with a diabetes educator?  Where can I find a support group for people with diabetes? Where to find more information To learn more about diabetes, visit:  American Diabetes Association: www.diabetes.org  American Association of Diabetes  Educators: www.diabeteseducator.org Summary  When you have type 2 diabetes, you must make sure your blood sugar (glucose) stays in a healthy range.  Check your blood sugar every day, as often as told.  Having diabetes can raise your risk for other conditions. Your doctor may prescribe medicines to help you not have these problems.  Keep all follow-up visits as told by your doctor. This is important. This information is not intended to replace advice given to you by your health care provider. Make sure you discuss any questions you have with your health care provider. Document Revised: 07/25/2017 Document Reviewed: 03/07/2015 Elsevier Patient Education  2020 [employer] Inc[employer]   Diabetes Mellitus and Nutrition, Adult When you have diabetes (diabetes mellitus), it is very important to have healthy eating habits because your blood sugar (glucose) levels are greatly affected by what you   eat and drink. Eating healthy foods in the appropriate amounts, at about the same times every day, can help you:  Control your blood glucose.  Lower your risk of heart disease.  Improve your blood pressure.  Reach or maintain a healthy weight. Every person with diabetes is different, and each person has different needs for a meal plan. Your health care provider may recommend that you work with a diet and nutrition specialist (dietitian) to make a meal plan that is best for you. Your meal plan may vary depending on factors such as:  The calories you need.  The medicines you take.  Your weight.  Your blood glucose, blood pressure, and cholesterol levels.  Your activity level.  Other health conditions you have, such as heart or kidney disease. How do carbohydrates affect me? Carbohydrates, also called carbs, affect your blood glucose level more than any other type of food. Eating carbs naturally raises the amount of glucose in your blood. Carb counting is a method for keeping track of how many carbs you  eat. Counting carbs is important to keep your blood glucose at a healthy level, especially if you use insulin or take certain oral diabetes medicines. It is important to know how many carbs you can safely have in each meal. This is different for every person. Your dietitian can help you calculate how many carbs you should have at each meal and for each snack. Foods that contain carbs include:  Bread, cereal, rice, pasta, and crackers.  Potatoes and corn.  Peas, beans, and lentils.  Milk and yogurt.  Fruit and juice.  Desserts, such as cakes, cookies, ice cream, and candy. How does alcohol affect me? Alcohol can cause a sudden decrease in blood glucose (hypoglycemia), especially if you use insulin or take certain oral diabetes medicines. Hypoglycemia can be a life-threatening condition. Symptoms of hypoglycemia (sleepiness, dizziness, and confusion) are similar to symptoms of having too much alcohol. If your health care provider says that alcohol is safe for you, follow these guidelines:  Limit alcohol intake to no more than 1 drink per day for nonpregnant women and 2 drinks per day for men. One drink equals 12 oz of beer, 5 oz of wine, or 1 oz of hard liquor.  Do not drink on an empty stomach.  Keep yourself hydrated with water, diet soda, or unsweetened iced tea.  Keep in mind that regular soda, juice, and other mixers may contain a lot of sugar and must be counted as carbs. What are tips for following this plan?  Reading food labels  Start by checking the serving size on the "Nutrition Facts" label of packaged foods and drinks. The amount of calories, carbs, fats, and other nutrients listed on the label is based on one serving of the item. Many items contain more than one serving per package.  Check the total grams (g) of carbs in one serving. You can calculate the number of servings of carbs in one serving by dividing the total carbs by 15. For example, if a food has 30 g of total  carbs, it would be equal to 2 servings of carbs.  Check the number of grams (g) of saturated and trans fats in one serving. Choose foods that have low or no amount of these fats.  Check the number of milligrams (mg) of salt (sodium) in one serving. Most people should limit total sodium intake to less than 2,300 mg per day.  Always check the nutrition information of foods labeled   as "low-fat" or "nonfat". These foods may be higher in added sugar or refined carbs and should be avoided.  Talk to your dietitian to identify your daily goals for nutrients listed on the label. Shopping  Avoid buying canned, premade, or processed foods. These foods tend to be high in fat, sodium, and added sugar.  Shop around the outside edge of the grocery store. This includes fresh fruits and vegetables, bulk grains, fresh meats, and fresh dairy. Cooking  Use low-heat cooking methods, such as baking, instead of high-heat cooking methods like deep frying.  Cook using healthy oils, such as olive, canola, or sunflower oil.  Avoid cooking with butter, cream, or high-fat meats. Meal planning  Eat meals and snacks regularly, preferably at the same times every day. Avoid going long periods of time without eating.  Eat foods high in fiber, such as fresh fruits, vegetables, beans, and whole grains. Talk to your dietitian about how many servings of carbs you can eat at each meal.  Eat 4-6 ounces (oz) of lean protein each day, such as lean meat, chicken, fish, eggs, or tofu. One oz of lean protein is equal to: ? 1 oz of meat, chicken, or fish. ? 1 egg. ?  cup of tofu.  Eat some foods each day that contain healthy fats, such as avocado, nuts, seeds, and fish. Lifestyle  Check your blood glucose regularly.  Exercise regularly as told by your health care provider. This may include: ? 150 minutes of moderate-intensity or vigorous-intensity exercise each week. This could be brisk walking, biking, or water  aerobics. ? Stretching and doing strength exercises, such as yoga or weightlifting, at least 2 times a week.  Take medicines as told by your health care provider.  Do not use any products that contain nicotine or tobacco, such as cigarettes and e-cigarettes. If you need help quitting, ask your health care provider.  Work with a Social worker or diabetes educator to identify strategies to manage stress and any emotional and social challenges. Questions to ask a health care provider  Do I need to meet with a diabetes educator?  Do I need to meet with a dietitian?  What number can I call if I have questions?  When are the best times to check my blood glucose? Where to find more information:  American Diabetes Association: diabetes.org  Academy of Nutrition and Dietetics: www.eatright.[employer] of Diabetes and Digestive and Kidney Diseases (NIH): [employer] Summary  A healthy meal plan will help you control your blood glucose and maintain a healthy lifestyle.  Working with a diet and nutrition specialist (dietitian) can help you make a meal plan that is best for you.  Keep in mind that carbohydrates (carbs) and alcohol have immediate effects on your blood glucose levels. It is important to count carbs and to use alcohol carefully. This information is not intended to replace advice given to you by your health care provider. Make sure you discuss any questions you have with your health care provider. Document Revised: 01/14/2017 Document Reviewed: 03/08/2016 Elsevier Patient Education  [place].   High Cholesterol  High cholesterol is a condition in which the blood has high levels of a white, waxy, fat-like substance (cholesterol). The human body needs small amounts of cholesterol. The liver makes all the cholesterol that the body needs. Extra (excess) cholesterol comes from the food that we eat. Cholesterol is carried from the liver by the blood through  the blood vessels. If you have high cholesterol,  deposits (plaques) may build up on the walls of your blood vessels (arteries). Plaques make the arteries narrower and stiffer. Cholesterol plaques increase your risk for heart attack and stroke. Work with your health care provider to keep your cholesterol levels in a healthy range. What increases the risk? This condition is more likely to develop in people who:  Eat foods that are high in animal fat (saturated fat) or cholesterol.  Are overweight.  Are not getting enough exercise.  Have a family history of high cholesterol. What are the signs or symptoms? There are no symptoms of this condition. How is this diagnosed? This condition may be diagnosed from the results of a blood test.  If you are older than age 52, your health care provider may check your cholesterol every 4-6 years.  You may be checked more often if you already have high cholesterol or other risk factors for heart disease. The blood test for cholesterol measures:  "Bad" cholesterol (LDL cholesterol). This is the main type of cholesterol that causes heart disease. The desired level for LDL is less than 100.  "Good" cholesterol (HDL cholesterol). This type helps to protect against heart disease by cleaning the arteries and carrying the LDL away. The desired level for HDL is 60 or higher.  Triglycerides. These are fats that the body can store or burn for energy. The desired number for triglycerides is lower than 150.  Total cholesterol. This is a measure of the total amount of cholesterol in your blood, including LDL cholesterol, HDL cholesterol, and triglycerides. A healthy number is less than 200. How is this treated? This condition is treated with diet changes, lifestyle changes, and medicines. Diet changes  This may include eating more whole grains, fruits, vegetables, nuts, and fish.  This may also include cutting back on red meat and foods that have a lot of added  sugar. Lifestyle changes  Changes may include getting at least 40 minutes of aerobic exercise 3 times a week. Aerobic exercises include walking, biking, and swimming. Aerobic exercise along with a healthy diet can help you maintain a healthy weight.  Changes may also include quitting smoking. Medicines  Medicines are usually given if diet and lifestyle changes have failed to reduce your cholesterol to healthy levels.  Your health care provider may prescribe a statin medicine. Statin medicines have been shown to reduce cholesterol, which can reduce the risk of heart disease. Follow these instructions at home: Eating and drinking If told by your health care provider:  Eat chicken (without skin), fish, veal, shellfish, ground [place] breast, and round or loin cuts of red meat.  Do not eat fried foods or fatty meats, such as hot dogs and salami.  Eat plenty of fruits, such as apples.  Eat plenty of vegetables, such as broccoli, potatoes, and carrots.  Eat beans, peas, and lentils.  Eat grains such as barley, rice, couscous, and bulgur wheat.  Eat pasta without cream sauces.  Use skim or nonfat milk, and eat low-fat or nonfat yogurt and cheeses.  Do not eat or drink whole milk, cream, ice cream, egg yolks, or hard cheeses.  Do not eat stick margarine or tub margarines that contain trans fats (also called partially hydrogenated oils).  Do not eat saturated tropical oils, such as coconut oil and palm oil.  Do not eat cakes, cookies, crackers, or other baked goods that contain trans fats.  General instructions  Exercise as directed by your health care provider. Increase your activity level with activities  such as gardening, walking, and taking the stairs.  Take over-the-counter and prescription medicines only as told by your health care provider.  Do not use any products that contain nicotine or tobacco, such as cigarettes and e-cigarettes. If you need help quitting, ask your  health care provider.  Keep all follow-up visits as told by your health care provider. This is important. Contact a health care provider if:  You are struggling to maintain a healthy diet or weight.  You need help to start on an exercise program.  You need help to stop smoking. Get help right away if:  You have chest pain.  You have trouble breathing. This information is not intended to replace advice given to you by your health care provider. Make sure you discuss any questions you have with your health care provider. Document Revised: 02/04/2017 Document Reviewed: 08/02/2015 Elsevier Patient Education  [place].

## 2019-09-26 ENCOUNTER — Encounter (HOSPITAL_COMMUNITY): Payer: Self-pay

## 2019-09-26 ENCOUNTER — Encounter (HOSPITAL_COMMUNITY)
Admission: RE | Admit: 2019-09-26 | Discharge: 2019-09-26 | Disposition: A | Discharge status: Home / Self Care | Payer: Medicare PPO | Source: Ambulatory Visit | Attending: Orthopedic Surgery | Admitting: Orthopedic Surgery

## 2019-09-26 ENCOUNTER — Other Ambulatory Visit: Payer: Self-pay

## 2019-09-26 DIAGNOSIS — Z01818 Encounter for other preprocedural examination: Secondary | ICD-10-CM | POA: Diagnosis not present

## 2019-09-26 LAB — CBC WITH DIFFERENTIAL/PLATELET
Absolute Monocytes: 481 cells/uL (ref 200–950)
Basophils Absolute: 38 cells/uL (ref 0–200)
Basophils Relative: 0.7 %
Eosinophils Absolute: 211 cells/uL (ref 15–500)
Eosinophils Relative: 3.9 %
HCT: 43.8 % (ref 35.0–45.0)
Hemoglobin: 13.7 g/dL (ref 11.7–15.5)
Lymphs Abs: 929 cells/uL (ref 850–3900)
MCH: 25.3 pg — ABNORMAL LOW (ref 27.0–33.0)
MCHC: 31.3 g/dL — ABNORMAL LOW (ref 32.0–36.0)
MCV: 80.8 fL (ref 80.0–100.0)
MPV: 8.8 fL (ref 7.5–12.5)
Monocytes Relative: 8.9 %
Neutro Abs: 3742 cells/uL (ref 1500–7800)
Neutrophils Relative %: 69.3 %
Platelets: 346 10*3/uL (ref 140–400)
RBC: 5.42 10*6/uL — ABNORMAL HIGH (ref 3.80–5.10)
RDW: 14.3 % (ref 11.0–15.0)
Total Lymphocyte: 17.2 %
WBC: 5.4 10*3/uL (ref 3.8–10.8)

## 2019-09-26 LAB — MICROALBUMIN / CREATININE URINE RATIO
Creatinine, Urine: 108 mg/dL (ref 20–275)
Microalb Creat Ratio: 77 mcg/mg creat — ABNORMAL HIGH (ref <30)
Microalb, Ur: 8.3 mg/dL

## 2019-09-26 LAB — COMPREHENSIVE METABOLIC PANEL
ALT: 15 U/L (ref 0–44)
AST: 14 U/L — ABNORMAL LOW (ref 15–41)
Albumin: 4 g/dL (ref 3.5–5.0)
Alkaline Phosphatase: 88 U/L (ref 38–126)
Anion gap: 8 (ref 5–15)
BUN: 19 mg/dL (ref 8–23)
CO2: 27 mmol/L (ref 22–32)
Calcium: 9.6 mg/dL (ref 8.9–10.3)
Chloride: 104 mmol/L (ref 98–111)
Creatinine, Ser: 0.83 mg/dL (ref 0.44–1.00)
GFR calc Af Amer: >60 mL/min (ref >60)
GFR calc non Af Amer: >60 mL/min (ref >60)
Glucose, Bld: 120 mg/dL — ABNORMAL HIGH (ref 70–99)
Potassium: 4.4 mmol/L (ref 3.5–5.1)
Sodium: 139 mmol/L (ref 135–145)
Total Bilirubin: 0.5 mg/dL (ref 0.3–1.2)
Total Protein: 7.8 g/dL (ref 6.5–8.1)

## 2019-09-26 LAB — APTT: aPTT: 29 seconds (ref 24–36)

## 2019-09-26 LAB — CBC
HCT: 44.1 % (ref 36.0–46.0)
Hemoglobin: 13.8 g/dL (ref 12.0–15.0)
MCH: 25.4 pg — ABNORMAL LOW (ref 26.0–34.0)
MCHC: 31.3 g/dL (ref 30.0–36.0)
MCV: 81.2 fL (ref 80.0–100.0)
Platelets: 319 10*3/uL (ref 150–400)
RBC: 5.43 MIL/uL — ABNORMAL HIGH (ref 3.87–5.11)
RDW: 15.1 % (ref 11.5–15.5)
WBC: 6.4 10*3/uL (ref 4.0–10.5)
nRBC: 0 % (ref 0.0–0.2)

## 2019-09-26 LAB — SURGICAL PCR SCREEN
MRSA, PCR: NEGATIVE
Staphylococcus aureus: POSITIVE — AB

## 2019-09-26 LAB — PROTIME-INR
INR: 1 (ref 0.8–1.2)
Prothrombin Time: 12.8 seconds (ref 11.4–15.2)

## 2019-09-26 LAB — IRON,TIBC AND FERRITIN PANEL
%SAT: 20 % (calc) (ref 16–45)
Ferritin: 18 ng/mL (ref 16–288)
Iron: 77 ug/dL (ref 45–160)
TIBC: 380 mcg/dL (calc) (ref 250–450)

## 2019-09-26 LAB — HEMOGLOBIN A1C
Hgb A1c MFr Bld: 7.3 % of total Hgb — ABNORMAL HIGH (ref <5.7)
Mean Plasma Glucose: 163 (calc)
eAG (mmol/L): 9 (calc)

## 2019-09-26 LAB — GLUCOSE, CAPILLARY: Glucose-Capillary: 114 mg/dL — ABNORMAL HIGH (ref 70–99)

## 2019-09-26 NOTE — Progress Notes (Signed)
COVID Vaccine Completed: yes Date COVID Vaccine completed: 05/09/19 COVID vaccine manufacturer: Pfizer   * Moderna   [employer]   PCP -  Cardiologist -   Chest x-ray -  EKG -  Stress Test -  ECHO -  Cardiac Cath -  A1-C: 8/10: 7.3 Sleep Study -  CPAP -   Fasting Blood Sugar - N/A Checks Blood Sugar __0___ times a day  Blood Thinner Instructions: Aspirin Instructions: Last Dose:  Anesthesia review:   Patient denies shortness of breath, fever, cough and chest pain at PAT appointment   Patient verbalized understanding of instructions that were given to them at the PAT appointment. Patient was also instructed that they will need to review over the PAT instructions again at home before surgery.

## 2019-09-27 NOTE — Progress Notes (Signed)
Lab results: PCR: positive: [facility].

## 2019-10-02 ENCOUNTER — Ambulatory Visit: Payer: Medicare PPO | Admitting: Internal Medicine

## 2019-10-04 ENCOUNTER — Other Ambulatory Visit (HOSPITAL_COMMUNITY)
Admission: RE | Admit: 2019-10-04 | Discharge: 2019-10-04 | Disposition: A | Discharge status: Home / Self Care | Payer: Medicare PPO | Source: Ambulatory Visit | Attending: Orthopedic Surgery | Admitting: Orthopedic Surgery

## 2019-10-04 DIAGNOSIS — Z20822 Contact with and (suspected) exposure to covid-19: Secondary | ICD-10-CM | POA: Diagnosis not present

## 2019-10-04 DIAGNOSIS — Z01812 Encounter for preprocedural laboratory examination: Secondary | ICD-10-CM | POA: Diagnosis not present

## 2019-10-04 LAB — SARS CORONAVIRUS 2 (TAT 6-24 HRS): SARS Coronavirus 2: NEGATIVE

## 2019-10-07 MED ORDER — BUPIVACAINE LIPOSOME 1.3 % IJ SUSP
20.0000 mL | Freq: Once | INTRAMUSCULAR | Status: DC
  Filled 2019-10-07: qty 20

## 2019-10-07 NOTE — H&P (Signed)
TOTAL KNEE ADMISSION H&P  Patient is being admitted for left total knee arthroplasty.  Subjective:  Chief Complaint:left knee pain.  HPI: Renee Young, 74 y.o. female, has a history of pain and functional disability in the left knee due to arthritis and has failed non-surgical conservative treatments for greater than 12 weeks to includecorticosteriod injections, viscosupplementation injections and activity modification.  Onset of symptoms was gradual, starting 2 years ago with gradually worsening course since that time. The patient noted no past surgery on the left knee(s).  Patient currently rates pain in the left knee(s) at 8 out of 10 with activity. Patient has worsening of pain with activity and weight bearing and pain that interferes with activities of daily living.  Patient has evidence of joint space narrowing by imaging studies.There is no active infection.  Patient Active Problem List   Diagnosis Date Noted  . Tachycardia 09/25/2019  . Hypochromic erythrocytes 09/24/2019  . Microcytic erythrocytes 09/24/2019  . Prediabetes 05/30/2019  . Family history of thyroid disease 05/30/2019  . Primary osteoarthritis of both knees 05/30/2019  . Bilateral temporomandibular joint pain 05/30/2019  . History of colonic polyps 12/17/2016  . Varicose veins of bilateral lower extremities with other complications 07/03/2012  . Pain in limb 07/03/2012  . DJD (degenerative joint disease) of knee 03/21/2009  . Type 2 diabetes mellitus with hyperglycemia, without long-term current use of insulin (HCC) 07/19/2007  . Mixed hyperlipidemia 07/19/2007  . DEPRESSION 07/19/2007  . ATTENTION DEFICIT DISORDER 07/19/2007   Past Medical History:  Diagnosis Date  . ADD (attention deficit disorder)   . Allergy   . Anemia   . Arthritis   . Chicken pox   . Colon polyps   . Depression   . Diabetes mellitus without complication (HCC)   . GERD (gastroesophageal reflux disease)   . Kidney stones   .  Phlebitis   . UTI (urinary tract infection)   . UTI (urinary tract infection)     Past Surgical History:  Procedure Laterality Date  . CESAREAN SECTION    . TUBAL LIGATION    . URETEROCELE INCISION    . VASCULAR SURGERY     varicose vein laser surgery    Current Facility-Administered Medications  Medication Dose Route Frequency Provider Last Rate Last Admin  . [START ON 10/08/2019] bupivacaine liposome (EXPAREL) 1.3 % injection 266 mg  20 mL Other Once Ollen Gross, MD       Current Outpatient Medications  Medication Sig Dispense Refill Last Dose  . amphetamine-dextroamphetamine (ADDERALL) 20 MG tablet Take 20 mg by mouth daily.   0   . cholecalciferol (VITAMIN D) 1000 units tablet Take 1,000 Units by mouth daily.     Marland Kitchen FLUoxetine (PROZAC) 20 MG capsule Take 40 mg by mouth at bedtime.      Marland Kitchen atorvastatin (LIPITOR) 20 MG tablet Take 1 tablet (20 mg total) by mouth daily. 90 tablet 3   . metFORMIN (GLUCOPHAGE) 500 MG tablet Take 1 tablet (500 mg total) by mouth 2 (two) times daily with a meal. 180 tablet 3   . Multiple Vitamin (MULTIVITAMIN) tablet Take 1 tablet by mouth daily.    Not Taking at Unknown time   Allergies  Allergen Reactions  . Nitrofurantoin Hives and Swelling  . Penicillins Swelling    Social History   Tobacco Use  . Smoking status: Never Smoker  . Smokeless tobacco: Never Used  Substance Use Topics  . Alcohol use: Yes    Alcohol/week: 2.0 standard drinks  Types: 2 Glasses of wine per week    Comment: occas.    Family History  Problem Relation Age of Onset  . Cancer Mother   . Kidney disease Mother   . Diabetes Father   . Heart disease Father   . Hyperlipidemia Father   . Hypertension Father   . Stroke Father   . Arthritis Sister   . Obesity Brother   . Arthritis Brother   . Heart disease Maternal Grandmother   . Stroke Maternal Grandmother   . Hypertension Maternal Grandmother   . Heart disease Maternal Grandfather   . Hyperlipidemia Maternal  Grandfather   . Stroke Paternal Grandmother   . Heart disease Paternal Grandmother   . Heart disease Paternal Grandfather   . Heart attack Paternal Grandfather   . Arthritis Daughter      Review of Systems  Constitutional: Negative for chills and fever.  Respiratory: Negative for cough and shortness of breath.   Cardiovascular: Negative for chest pain.  Gastrointestinal: Negative for nausea and vomiting.  Musculoskeletal: Positive for arthralgias.    Objective:  Physical Exam Patient is a 74 year old female.  Well nourished and well developed. General: Alert and oriented x3, cooperative and pleasant, no acute distress. Head: normocephalic, atraumatic, neck supple. Eyes: EOMI. Respiratory: breath sounds clear in all fields, no wheezing, rales, or rhonchi. Cardiovascular: Regular rate and rhythm, no murmurs, gallops or rubs. Abdomen: non-tender to palpation and soft, normoactive bowel sounds.  Musculoskeletal: Left Knee Exam: No effusion present. No swelling present. The range of motion is: 0 to 130 degrees. Moderate crepitus on range of motion of the knee. Slight medial joint line tenderness. No lateral joint line tenderness. The knee is stable.  Calves soft and nontender. Motor function intact in LE. Strength 5/5 LE bilaterally. Neuro: Distal pulses 2+. Sensation to light touch intact in LE. Vital signs in last 24 hours:    Labs:   Estimated body mass index is 29.5 kg/m as calculated from the following:   Height as of 09/25/19: 5\' 5"  (1.651 m).   Weight as of 09/25/19: 80.4 kg.   Imaging Review Plain radiographs demonstrate severe degenerative joint disease of the left knee(s). The overall alignment isneutral. The bone quality appears to be adequate for age and reported activity level.      Assessment/Plan:  End stage arthritis, left knee   The patient history, physical examination, clinical judgment of the provider and imaging studies are consistent with  end stage degenerative joint disease of the left knee(s) and total knee arthroplasty is deemed medically necessary. The treatment options including medical management, injection therapy arthroscopy and arthroplasty were discussed at length. The risks and benefits of total knee arthroplasty were presented and reviewed. The risks due to aseptic loosening, infection, stiffness, patella tracking problems, thromboembolic complications and other imponderables were discussed. The patient acknowledged the explanation, agreed to proceed with the plan and consent was signed. Patient is being admitted for inpatient treatment for surgery, pain control, PT, OT, prophylactic antibiotics, VTE prophylaxis, progressive ambulation and ADL's and discharge planning. The patient is planning to be discharged home.   - Patient was instructed on what medications to stop prior to surgery. - Follow-up visit in 2 weeks with Dr. 11/25/19 - Begin physical therapy following surgery - Pre-operative lab work as pre-surgical testing - Prescriptions will be provided in hospital at time of discharge  Therapy Plans: outpatient therapy at [facility] in [place] Disposition: Home with husband Planned DVT Prophylaxis: aspirin 325mg  BID DME needed: none  PCP: Dr. Vladimir Faster, appointment on 8/10 TXA: IV Allergies: PCN - hives Anesthesia Concerns: none BMI: 31.5 Last HgbA1c: 7.1%  Other: Planning on same day discharge. Hx of superficial blood clot in the left leg in 2015/16.   Patient's anticipated LOS is less than 2 midnights, meeting these requirements: - Younger than 42 - Lives within 1 hour of care - Has a competent adult at home to recover with post-op recover - NO history of  - Chronic pain requiring opiods  - Diabetes  - Coronary Artery Disease  - Heart failure  - Heart attack  - Stroke  - DVT/VTE  - Cardiac arrhythmia  - Respiratory Failure/COPD  - Renal failure  - Anemia  - Advanced Liver disease  Dennie Bible,  PA-C Orthopedic Surgery [facility] [place] [phone]

## 2019-10-08 ENCOUNTER — Ambulatory Visit (HOSPITAL_COMMUNITY): Payer: Medicare PPO | Admitting: Certified Registered"

## 2019-10-08 ENCOUNTER — Other Ambulatory Visit: Payer: Self-pay

## 2019-10-08 ENCOUNTER — Observation Stay (HOSPITAL_COMMUNITY)
Admission: RE | Admit: 2019-10-08 | Discharge: 2019-10-09 | Disposition: A | Discharge status: Home / Self Care | Payer: Medicare PPO | Attending: Orthopedic Surgery | Admitting: Orthopedic Surgery

## 2019-10-08 ENCOUNTER — Encounter (HOSPITAL_COMMUNITY): Payer: Self-pay | Admitting: Orthopedic Surgery

## 2019-10-08 ENCOUNTER — Encounter (HOSPITAL_COMMUNITY)
Admission: RE | Disposition: A | Discharge status: Home / Self Care | Payer: Self-pay | Source: Home / Self Care | Attending: Orthopedic Surgery

## 2019-10-08 DIAGNOSIS — M1712 Unilateral primary osteoarthritis, left knee: Principal | ICD-10-CM | POA: Insufficient documentation

## 2019-10-08 DIAGNOSIS — F909 Attention-deficit hyperactivity disorder, unspecified type: Secondary | ICD-10-CM | POA: Diagnosis not present

## 2019-10-08 DIAGNOSIS — E782 Mixed hyperlipidemia: Secondary | ICD-10-CM | POA: Diagnosis not present

## 2019-10-08 DIAGNOSIS — Z7984 Long term (current) use of oral hypoglycemic drugs: Secondary | ICD-10-CM | POA: Insufficient documentation

## 2019-10-08 DIAGNOSIS — E1165 Type 2 diabetes mellitus with hyperglycemia: Secondary | ICD-10-CM | POA: Diagnosis not present

## 2019-10-08 DIAGNOSIS — Z79899 Other long term (current) drug therapy: Secondary | ICD-10-CM | POA: Diagnosis not present

## 2019-10-08 DIAGNOSIS — G8918 Other acute postprocedural pain: Secondary | ICD-10-CM | POA: Diagnosis not present

## 2019-10-08 DIAGNOSIS — F329 Major depressive disorder, single episode, unspecified: Secondary | ICD-10-CM | POA: Diagnosis not present

## 2019-10-08 DIAGNOSIS — Z96659 Presence of unspecified artificial knee joint: Secondary | ICD-10-CM

## 2019-10-08 DIAGNOSIS — M179 Osteoarthritis of knee, unspecified: Secondary | ICD-10-CM | POA: Diagnosis present

## 2019-10-08 HISTORY — PX: TOTAL KNEE ARTHROPLASTY: SHX125

## 2019-10-08 LAB — BPAM RBC
Blood Product Expiration Date: 202109162359
Blood Product Expiration Date: 202109182359
Unit Type and Rh: 6200
Unit Type and Rh: 6200

## 2019-10-08 LAB — TYPE AND SCREEN
ABO/RH(D): A POS
Antibody Screen: POSITIVE
Donor AG Type: NEGATIVE
Donor AG Type: NEGATIVE
PT AG Type: NEGATIVE
Unit division: 0
Unit division: 0

## 2019-10-08 LAB — GLUCOSE, CAPILLARY
Glucose-Capillary: 121 mg/dL — ABNORMAL HIGH (ref 70–99)
Glucose-Capillary: 121 mg/dL — ABNORMAL HIGH (ref 70–99)
Glucose-Capillary: 157 mg/dL — ABNORMAL HIGH (ref 70–99)

## 2019-10-08 LAB — ABO/RH: ABO/RH(D): A POS

## 2019-10-08 SURGERY — ARTHROPLASTY, KNEE, TOTAL
Anesthesia: Spinal | Site: Knee | Laterality: Left

## 2019-10-08 MED ORDER — CEFAZOLIN SODIUM-DEXTROSE 2-4 GM/100ML-% IV SOLN
INTRAVENOUS | Status: AC
  Filled 2019-10-08: qty 100

## 2019-10-08 MED ORDER — INSULIN ASPART 100 UNIT/ML ~~LOC~~ SOLN
0.0000 [IU] | Freq: Three times a day (TID) | SUBCUTANEOUS | Status: DC
  Administered 2019-10-09 (×2): 2 [IU] via SUBCUTANEOUS

## 2019-10-08 MED ORDER — POLYETHYLENE GLYCOL 3350 17 G PO PACK: 17.0000 g | PACK | Freq: Every day | ORAL | Status: DC | PRN

## 2019-10-08 MED ORDER — ONDANSETRON HCL 4 MG/2ML IJ SOLN
INTRAMUSCULAR | Status: AC
  Filled 2019-10-08: qty 2

## 2019-10-08 MED ORDER — PROPOFOL 10 MG/ML IV BOLUS
INTRAVENOUS | Status: DC | PRN
  Administered 2019-10-08: 10 mg via INTRAVENOUS

## 2019-10-08 MED ORDER — HYDROMORPHONE HCL 1 MG/ML IJ SOLN
INTRAMUSCULAR | Status: AC
  Filled 2019-10-08: qty 1

## 2019-10-08 MED ORDER — GABAPENTIN 300 MG PO CAPS: ORAL_CAPSULE | ORAL | 0 refills | Status: DC

## 2019-10-08 MED ORDER — PROPOFOL 1000 MG/100ML IV EMUL
INTRAVENOUS | Status: AC
  Filled 2019-10-08: qty 100

## 2019-10-08 MED ORDER — ASPIRIN EC 325 MG PO TBEC
325.0000 mg | DELAYED_RELEASE_TABLET | Freq: Two times a day (BID) | ORAL | Status: DC
  Administered 2019-10-09: 325 mg via ORAL
  Filled 2019-10-08: qty 1

## 2019-10-08 MED ORDER — DOCUSATE SODIUM 100 MG PO CAPS
100.0000 mg | ORAL_CAPSULE | Freq: Two times a day (BID) | ORAL | Status: DC
  Administered 2019-10-08 – 2019-10-09 (×2): 100 mg via ORAL
  Filled 2019-10-08 (×2): qty 1

## 2019-10-08 MED ORDER — OXYCODONE HCL 5 MG PO TABS: 5.0000 mg | ORAL_TABLET | Freq: Once | ORAL | Status: DC | PRN

## 2019-10-08 MED ORDER — DEXAMETHASONE SODIUM PHOSPHATE 10 MG/ML IJ SOLN
8.0000 mg | Freq: Once | INTRAMUSCULAR | Status: AC
  Administered 2019-10-08: 8 mg via INTRAVENOUS

## 2019-10-08 MED ORDER — FLEET ENEMA 7-19 GM/118ML RE ENEM: 1.0000 | ENEMA | Freq: Once | RECTAL | Status: DC | PRN

## 2019-10-08 MED ORDER — PROMETHAZINE HCL 25 MG/ML IJ SOLN: 6.2500 mg | INTRAMUSCULAR | Status: DC | PRN

## 2019-10-08 MED ORDER — LACTATED RINGERS IV BOLUS
250.0000 mL | Freq: Once | INTRAVENOUS | Status: AC
  Administered 2019-10-08: 250 mL via INTRAVENOUS

## 2019-10-08 MED ORDER — FLUOXETINE HCL 20 MG PO CAPS
40.0000 mg | ORAL_CAPSULE | Freq: Every day | ORAL | Status: DC
  Administered 2019-10-08: 40 mg via ORAL
  Filled 2019-10-08: qty 2

## 2019-10-08 MED ORDER — OXYCODONE HCL 5 MG PO TABS: 5.0000 mg | ORAL_TABLET | Freq: Four times a day (QID) | ORAL | 0 refills | Status: DC | PRN

## 2019-10-08 MED ORDER — OXYCODONE HCL 5 MG/5ML PO SOLN: 5.0000 mg | Freq: Once | ORAL | Status: DC | PRN

## 2019-10-08 MED ORDER — OXYCODONE HCL 5 MG PO TABS: 10.0000 mg | ORAL_TABLET | ORAL | Status: DC | PRN

## 2019-10-08 MED ORDER — ASPIRIN EC 325 MG PO TBEC: 325.0000 mg | DELAYED_RELEASE_TABLET | Freq: Two times a day (BID) | ORAL | Status: DC

## 2019-10-08 MED ORDER — METHOCARBAMOL 500 MG IVPB - SIMPLE MED
500.0000 mg | Freq: Four times a day (QID) | INTRAVENOUS | Status: DC | PRN
  Administered 2019-10-08: 500 mg via INTRAVENOUS
  Filled 2019-10-08: qty 50

## 2019-10-08 MED ORDER — GABAPENTIN 300 MG PO CAPS
300.0000 mg | ORAL_CAPSULE | Freq: Three times a day (TID) | ORAL | Status: DC
  Administered 2019-10-08 – 2019-10-09 (×3): 300 mg via ORAL
  Filled 2019-10-08 (×3): qty 1

## 2019-10-08 MED ORDER — ASPIRIN EC 325 MG PO TBEC: 325.0000 mg | DELAYED_RELEASE_TABLET | Freq: Every day | ORAL | 0 refills | Status: DC

## 2019-10-08 MED ORDER — PROPOFOL 10 MG/ML IV BOLUS
INTRAVENOUS | Status: AC
  Filled 2019-10-08: qty 20

## 2019-10-08 MED ORDER — HYDROMORPHONE HCL 1 MG/ML IJ SOLN
0.2500 mg | INTRAMUSCULAR | Status: DC | PRN
  Administered 2019-10-08: 0.5 mg via INTRAVENOUS

## 2019-10-08 MED ORDER — ACETAMINOPHEN 500 MG PO TABS
ORAL_TABLET | ORAL | Status: AC
  Filled 2019-10-08: qty 2

## 2019-10-08 MED ORDER — BUPIVACAINE LIPOSOME 1.3 % IJ SUSP
INTRAMUSCULAR | Status: DC | PRN
  Administered 2019-10-08: 20 mL

## 2019-10-08 MED ORDER — SODIUM CHLORIDE (PF) 0.9 % IJ SOLN
INTRAMUSCULAR | Status: AC
  Filled 2019-10-08: qty 50

## 2019-10-08 MED ORDER — LACTATED RINGERS IV BOLUS: 250.0000 mL | Freq: Once | INTRAVENOUS | Status: DC

## 2019-10-08 MED ORDER — ONDANSETRON HCL 4 MG PO TABS: 4.0000 mg | ORAL_TABLET | Freq: Four times a day (QID) | ORAL | Status: DC | PRN

## 2019-10-08 MED ORDER — MIDAZOLAM HCL 2 MG/2ML IJ SOLN
INTRAMUSCULAR | Status: DC | PRN
  Administered 2019-10-08: 2 mg via INTRAVENOUS

## 2019-10-08 MED ORDER — CHLORHEXIDINE GLUCONATE 0.12 % MT SOLN
15.0000 mL | Freq: Once | OROMUCOSAL | Status: AC
  Administered 2019-10-08: 15 mL via OROMUCOSAL

## 2019-10-08 MED ORDER — SODIUM CHLORIDE (PF) 0.9 % IJ SOLN
INTRAMUSCULAR | Status: AC
  Filled 2019-10-08: qty 10

## 2019-10-08 MED ORDER — METHOCARBAMOL 500 MG PO TABS: 500.0000 mg | ORAL_TABLET | Freq: Four times a day (QID) | ORAL | 0 refills | Status: DC | PRN

## 2019-10-08 MED ORDER — INSULIN ASPART 100 UNIT/ML ~~LOC~~ SOLN: 0.0000 [IU] | Freq: Every day | SUBCUTANEOUS | Status: DC

## 2019-10-08 MED ORDER — OXYCODONE HCL 5 MG PO TABS
ORAL_TABLET | ORAL | Status: AC
  Filled 2019-10-08: qty 1

## 2019-10-08 MED ORDER — POVIDONE-IODINE 10 % EX SWAB
2.0000 "application " | Freq: Once | CUTANEOUS | Status: AC
  Administered 2019-10-08: 2 via TOPICAL

## 2019-10-08 MED ORDER — STERILE WATER FOR IRRIGATION IR SOLN
Status: DC | PRN
  Administered 2019-10-08: 2000 mL

## 2019-10-08 MED ORDER — PHENOL 1.4 % MT LIQD: 1.0000 | OROMUCOSAL | Status: DC | PRN

## 2019-10-08 MED ORDER — MENTHOL 3 MG MT LOZG: 1.0000 | LOZENGE | OROMUCOSAL | Status: DC | PRN

## 2019-10-08 MED ORDER — METOCLOPRAMIDE HCL 5 MG/ML IJ SOLN: 5.0000 mg | Freq: Three times a day (TID) | INTRAMUSCULAR | Status: DC | PRN

## 2019-10-08 MED ORDER — TRANEXAMIC ACID-NACL 1000-0.7 MG/100ML-% IV SOLN
INTRAVENOUS | Status: AC
  Filled 2019-10-08: qty 100

## 2019-10-08 MED ORDER — METHOCARBAMOL 500 MG PO TABS
500.0000 mg | ORAL_TABLET | Freq: Four times a day (QID) | ORAL | Status: DC | PRN
  Administered 2019-10-09 (×2): 500 mg via ORAL
  Filled 2019-10-08 (×2): qty 1

## 2019-10-08 MED ORDER — ROPIVACAINE HCL 5 MG/ML IJ SOLN
INTRAMUSCULAR | Status: DC | PRN
  Administered 2019-10-08: 20 mL via PERINEURAL

## 2019-10-08 MED ORDER — 0.9 % SODIUM CHLORIDE (POUR BTL) OPTIME
TOPICAL | Status: DC | PRN
  Administered 2019-10-08: 1000 mL

## 2019-10-08 MED ORDER — DIPHENHYDRAMINE HCL 12.5 MG/5ML PO ELIX: 12.5000 mg | ORAL_SOLUTION | ORAL | Status: DC | PRN

## 2019-10-08 MED ORDER — ACETAMINOPHEN 10 MG/ML IV SOLN
INTRAVENOUS | Status: AC
  Filled 2019-10-08: qty 100

## 2019-10-08 MED ORDER — PROPOFOL 500 MG/50ML IV EMUL
INTRAVENOUS | Status: DC | PRN
  Administered 2019-10-08: 125 ug/kg/min via INTRAVENOUS

## 2019-10-08 MED ORDER — ACETAMINOPHEN 500 MG PO TABS
1000.0000 mg | ORAL_TABLET | Freq: Four times a day (QID) | ORAL | Status: AC
  Administered 2019-10-08 – 2019-10-09 (×4): 1000 mg via ORAL
  Filled 2019-10-08 (×3): qty 2

## 2019-10-08 MED ORDER — ONDANSETRON HCL 4 MG/2ML IJ SOLN
INTRAMUSCULAR | Status: DC | PRN
  Administered 2019-10-08: 4 mg via INTRAVENOUS

## 2019-10-08 MED ORDER — AMPHETAMINE-DEXTROAMPHETAMINE 20 MG PO TABS
20.0000 mg | ORAL_TABLET | Freq: Every day | ORAL | Status: DC
  Administered 2019-10-09: 20 mg via ORAL
  Filled 2019-10-08: qty 1

## 2019-10-08 MED ORDER — ACETAMINOPHEN 10 MG/ML IV SOLN
1000.0000 mg | Freq: Four times a day (QID) | INTRAVENOUS | Status: DC
  Administered 2019-10-08: 1000 mg via INTRAVENOUS

## 2019-10-08 MED ORDER — ONDANSETRON HCL 4 MG/2ML IJ SOLN: 4.0000 mg | Freq: Four times a day (QID) | INTRAMUSCULAR | Status: DC | PRN

## 2019-10-08 MED ORDER — CEFAZOLIN SODIUM-DEXTROSE 2-4 GM/100ML-% IV SOLN
2.0000 g | Freq: Four times a day (QID) | INTRAVENOUS | Status: AC
  Administered 2019-10-08 – 2019-10-09 (×2): 2 g via INTRAVENOUS
  Filled 2019-10-08 (×2): qty 100

## 2019-10-08 MED ORDER — MORPHINE SULFATE (PF) 4 MG/ML IV SOLN: 0.5000 mg | INTRAVENOUS | Status: DC | PRN

## 2019-10-08 MED ORDER — ORAL CARE MOUTH RINSE: 15.0000 mL | Freq: Once | OROMUCOSAL | Status: AC

## 2019-10-08 MED ORDER — DEXAMETHASONE SODIUM PHOSPHATE 10 MG/ML IJ SOLN
INTRAMUSCULAR | Status: AC
  Filled 2019-10-08: qty 1

## 2019-10-08 MED ORDER — SODIUM CHLORIDE 0.9 % IR SOLN
Status: DC | PRN
  Administered 2019-10-08: 1000 mL

## 2019-10-08 MED ORDER — SODIUM CHLORIDE 0.9 % IV SOLN: INTRAVENOUS | Status: DC

## 2019-10-08 MED ORDER — BISACODYL 10 MG RE SUPP: 10.0000 mg | Freq: Every day | RECTAL | Status: DC | PRN

## 2019-10-08 MED ORDER — METOCLOPRAMIDE HCL 5 MG PO TABS: 5.0000 mg | ORAL_TABLET | Freq: Three times a day (TID) | ORAL | Status: DC | PRN

## 2019-10-08 MED ORDER — TRANEXAMIC ACID-NACL 1000-0.7 MG/100ML-% IV SOLN
1000.0000 mg | INTRAVENOUS | Status: AC
  Administered 2019-10-08: 1000 mg via INTRAVENOUS

## 2019-10-08 MED ORDER — SODIUM CHLORIDE (PF) 0.9 % IJ SOLN
INTRAMUSCULAR | Status: DC | PRN
  Administered 2019-10-08: 60 mL

## 2019-10-08 MED ORDER — LACTATED RINGERS IV SOLN: INTRAVENOUS | Status: DC

## 2019-10-08 MED ORDER — LACTATED RINGERS IV BOLUS
500.0000 mL | Freq: Once | INTRAVENOUS | Status: AC
  Administered 2019-10-08: 500 mL via INTRAVENOUS

## 2019-10-08 MED ORDER — MIDAZOLAM HCL 2 MG/2ML IJ SOLN
INTRAMUSCULAR | Status: AC
  Filled 2019-10-08: qty 2

## 2019-10-08 MED ORDER — OXYCODONE HCL 5 MG PO TABS
5.0000 mg | ORAL_TABLET | ORAL | Status: DC | PRN
  Administered 2019-10-08 – 2019-10-09 (×3): 5 mg via ORAL
  Filled 2019-10-08 (×2): qty 1

## 2019-10-08 MED ORDER — METHOCARBAMOL 500 MG IVPB - SIMPLE MED
INTRAVENOUS | Status: AC
  Filled 2019-10-08: qty 50

## 2019-10-08 MED ORDER — FENTANYL CITRATE (PF) 100 MCG/2ML IJ SOLN
INTRAMUSCULAR | Status: DC | PRN
Start: 2019-10-08 — End: 2019-10-08
  Administered 2019-10-08: 50 ug via INTRAVENOUS

## 2019-10-08 MED ORDER — BUPIVACAINE IN DEXTROSE 0.75-8.25 % IT SOLN
INTRATHECAL | Status: DC | PRN
  Administered 2019-10-08: 1.6 mL via INTRATHECAL

## 2019-10-08 MED ORDER — CEFAZOLIN SODIUM-DEXTROSE 2-4 GM/100ML-% IV SOLN
2.0000 g | INTRAVENOUS | Status: AC
  Administered 2019-10-08: 2 g via INTRAVENOUS

## 2019-10-08 MED ORDER — FENTANYL CITRATE (PF) 100 MCG/2ML IJ SOLN
INTRAMUSCULAR | Status: AC
  Filled 2019-10-08: qty 2

## 2019-10-08 SURGICAL SUPPLY — 56 items
ATTUNE PSFEM LTSZ5 NARCEM KNEE (Femur) ×1 IMPLANT
ATTUNE PSRP INSR SZ5 8 KNEE (Insert) ×1 IMPLANT
BAG SPEC THK2 15X12 ZIP CLS (MISCELLANEOUS) ×1
BAG ZIPLOCK 12X15 (MISCELLANEOUS) ×2 IMPLANT
BASE TIBIAL ROT PLAT SZ 5 KNEE (Knees) IMPLANT
BLADE SAG 18X100X1.27 (BLADE) ×2 IMPLANT
BLADE SAW SGTL 11.0X1.19X90.0M (BLADE) ×2 IMPLANT
BLADE SURG SZ10 CARB STEEL (BLADE) ×4 IMPLANT
BNDG ELASTIC 6X5.8 VLCR STR LF (GAUZE/BANDAGES/DRESSINGS) ×2 IMPLANT
BOWL SMART MIX CTS (DISPOSABLE) ×2 IMPLANT
BSPLAT TIB 5 CMNT ROT PLAT STR (Knees) ×1 IMPLANT
CEMENT HV SMART SET (Cement) ×4 IMPLANT
CLSR STERI-STRIP ANTIMIC 1/2X4 (GAUZE/BANDAGES/DRESSINGS) ×1 IMPLANT
COVER SURGICAL LIGHT HANDLE (MISCELLANEOUS) ×2 IMPLANT
COVER WAND RF STERILE (DRAPES) IMPLANT
CUFF TOURN SGL QUICK 34 (TOURNIQUET CUFF) ×2
CUFF TRNQT CYL 34X4.125X (TOURNIQUET CUFF) ×1 IMPLANT
DECANTER SPIKE VIAL GLASS SM (MISCELLANEOUS) ×2 IMPLANT
DRAPE U-SHAPE 47X51 STRL (DRAPES) ×2 IMPLANT
DRSG AQUACEL AG ADV 3.5X10 (GAUZE/BANDAGES/DRESSINGS) ×2 IMPLANT
DURAPREP 26ML APPLICATOR (WOUND CARE) ×2 IMPLANT
ELECT REM PT RETURN 15FT ADLT (MISCELLANEOUS) ×2 IMPLANT
GLOVE BIO SURGEON STRL SZ7 (GLOVE) ×2 IMPLANT
GLOVE BIO SURGEON STRL SZ8 (GLOVE) ×2 IMPLANT
GLOVE BIOGEL M STRL SZ7.5 (GLOVE) ×1 IMPLANT
GLOVE BIOGEL PI IND STRL 7.0 (GLOVE) ×1 IMPLANT
GLOVE BIOGEL PI IND STRL 8 (GLOVE) ×1 IMPLANT
GLOVE BIOGEL PI INDICATOR 7.0 (GLOVE) ×1
GLOVE BIOGEL PI INDICATOR 8 (GLOVE) ×2
GOWN STRL REUS W/TWL LRG LVL3 (GOWN DISPOSABLE) ×4 IMPLANT
HANDPIECE INTERPULSE COAX TIP (DISPOSABLE) ×2
HOLDER FOLEY CATH W/STRAP (MISCELLANEOUS) IMPLANT
IMMOBILIZER KNEE 20 (SOFTGOODS) ×2
IMMOBILIZER KNEE 20 THIGH 36 (SOFTGOODS) ×1 IMPLANT
KIT TURNOVER KIT A (KITS) IMPLANT
MANIFOLD NEPTUNE II (INSTRUMENTS) ×2 IMPLANT
NS IRRIG 1000ML POUR BTL (IV SOLUTION) ×2 IMPLANT
PACK TOTAL KNEE CUSTOM (KITS) ×2 IMPLANT
PADDING CAST COTTON 6X4 STRL (CAST SUPPLIES) ×3 IMPLANT
PATELLA MEDIAL ATTUN 35MM KNEE (Knees) ×1 IMPLANT
PENCIL SMOKE EVACUATOR (MISCELLANEOUS) ×2 IMPLANT
PIN DRILL FIX HALF THREAD (BIT) ×1 IMPLANT
PIN STEINMAN FIXATION KNEE (PIN) ×1 IMPLANT
PROTECTOR NERVE ULNAR (MISCELLANEOUS) ×2 IMPLANT
SET HNDPC FAN SPRY TIP SCT (DISPOSABLE) ×1 IMPLANT
STRIP CLOSURE SKIN 1/2X4 (GAUZE/BANDAGES/DRESSINGS) ×4 IMPLANT
SUT MNCRL AB 4-0 PS2 18 (SUTURE) ×2 IMPLANT
SUT STRATAFIX 0 PDS 27 VIOLET (SUTURE) ×2
SUT VIC AB 2-0 CT1 27 (SUTURE) ×6
SUT VIC AB 2-0 CT1 TAPERPNT 27 (SUTURE) ×3 IMPLANT
SUTURE STRATFX 0 PDS 27 VIOLET (SUTURE) ×1 IMPLANT
SYR BULB IRRIG 60ML STRL (SYRINGE) ×1 IMPLANT
TIBIAL BASE ROT PLAT SZ 5 KNEE (Knees) ×2 IMPLANT
TRAY FOLEY MTR SLVR 16FR STAT (SET/KITS/TRAYS/PACK) ×1 IMPLANT
WATER STERILE IRR 1000ML POUR (IV SOLUTION) ×4 IMPLANT
WRAP KNEE MAXI GEL POST OP (GAUZE/BANDAGES/DRESSINGS) ×2 IMPLANT

## 2019-10-08 NOTE — Progress Notes (Signed)
Physical Therapy Treatment Patient Details Name: Renee Young MRN: [id number] DOB: [birth year] Today's Date: 10/08/2019    History of Present Illness s/p L TKA. PMH: ADD, DM    PT Comments    Pt continues to be motivated to mobilize. amb ~ 61' with RW and  Min to min/guard assist.  amb up/down 2 steps with min assist. Pt dizzy after 15' and stair training, able to return to recliner with 2 standing rest breaks. Dizziness worsened during last 15', better once reclined. BP 138/62 (BP prior to PT was 141/76). Feel pt is not safe to d/c home today d/t medical issues/dizziness with short distance amb,increasing  risk of falls. Pt concurs that her family would not feel comfortable managing her should this happen at home.   Follow Up Recommendations  Follow surgeon's recommendation for DC plan and follow-up therapies     Equipment Recommendations  3in1 (PT)    Recommendations for Other Services       Precautions / Restrictions Precautions Precautions: Fall;Knee Required Braces or Orthoses: Knee Immobilizer - Left Knee Immobilizer - Right: Discontinue once straight leg raise with < 10 degree lag Knee Immobilizer - Left: Discontinue once straight leg raise with < 10 degree lag Restrictions Weight Bearing Restrictions: No Other Position/Activity Restrictions: WBAT    Mobility  Bed Mobility Overal bed mobility: Needs Assistance Bed Mobility: Supine to Sit;Sit to Supine     Supine to sit: Min assist;HOB elevated Sit to supine: Mod assist;+2 for safety/equipment;+2 for physical assistance   General bed mobility comments: in recliner  Transfers Overall transfer level: Needs assistance Equipment used: Rolling walker (2 wheeled) Transfers: Sit to/from [employer] Sit to Stand: Min assist Stand pivot transfers: Min assist       General transfer comment: cues for hand placement, assist to rise and transition safely to RW  Ambulation/Gait Ambulation/Gait assistance:  Min guard;Min assist Gait Distance (Feet): 34 Feet Assistive device: Rolling walker (2 wheeled) Gait Pattern/deviations: Step-to pattern;Decreased stance time - left Gait velocity: decr   General Gait Details: cues for sequence and safety.   Stairs Stairs: Yes Stairs assistance: Min assist Stair Management: One rail Left;One rail Right;Step to pattern;Sideways Number of Stairs: 2 General stair comments: cues for sequence and technique. incr pain with stair training, dizzy after going up/down 2 steps   Wheelchair Mobility    Modified Rankin (Stroke Patients Only)       Balance Overall balance assessment: Needs assistance         Standing balance support: During functional activity;Bilateral upper extremity supported Standing balance-Leahy Scale: Poor                              Cognition Arousal/Alertness: Awake/alert Behavior During Therapy: WFL for tasks assessed/performed Overall Cognitive Status: Within Functional Limits for tasks assessed                                        Exercises Total Joint Exercises Ankle Circles/Pumps: AROM;Both;10 reps Quad Sets: AROM;Both;5 reps Heel Slides: AAROM;5 reps;Left Straight Leg Raises: AAROM;10 reps;Left    General Comments        Pertinent Vitals/Pain Pain Assessment: 0-10 Pain Score: 4  Pain Location: left knee Pain Descriptors / Indicators: Sore;Aching Pain Intervention(s): Limited activity within patient's tolerance;Monitored during session;Repositioned    Home Living Family/patient expects to be discharged  to:: Private residence Living Arrangements: Spouse/significant other;Children Available Help at Discharge: Family Type of Home: House Home Access: Stairs to enter Entrance Stairs-Rails: Right;Left Home Layout: One level Home Equipment: Environmental consultant - 2 wheels;Cane - single point;Bedside commode      Prior Function Level of Independence: Independent          PT Goals  (current goals can now be found in the care plan section) Acute Rehab PT Goals Patient Stated Goal: to feel better and go home if possible PT Goal Formulation: With patient Time For Goal Achievement: 10/15/19 Potential to Achieve Goals: Good Progress towards PT goals: Progressing toward goals    Frequency    7X/week      PT Plan Current plan remains appropriate    Co-evaluation              AM-PAC PT "6 Clicks" Mobility   Outcome Measure  Help needed turning from your back to your side while in a flat bed without using bedrails?: A Little Help needed moving from lying on your back to sitting on the side of a flat bed without using bedrails?: A Little Help needed moving to and from a bed to a chair (including a wheelchair)?: A Little Help needed standing up from a chair using your arms (e.g., wheelchair or bedside chair)?: A Little Help needed to walk in hospital room?: A Little Help needed climbing 3-5 steps with a railing? : A Little 6 Click Score: 18    End of Session Equipment Utilized During Treatment: Gait belt;Left knee immobilizer Activity Tolerance: Treatment limited secondary to medical complications (Comment) Patient left: in chair;with call bell/phone within reach Nurse Communication: Mobility status PT Visit Diagnosis: Difficulty in walking, not elsewhere classified (R26.2);Other abnormalities of gait and mobility (R26.89)     Time: 1610-9604 PT Time Calculation (min) (ACUTE ONLY): 29 min  Charges:  $Gait Training: 23-37 mins $Therapeutic Activity: 8-22 mins                     Delice Bison, PT  Acute Rehab Dept ([facility]) [phone] Pager [phone]  10/08/2019    [facility] 10/08/2019, 3:43 PM

## 2019-10-08 NOTE — Care Plan (Signed)
Ortho Bundle Case Management Note  Patient Details  Name: Renee Young MRN: [id number] Date of Birth: 11-Dec-1945  L TKA on 10-08-19 DCP:  Home with husband.  1 story home with 3 ste. DME:  Borrowing a RW.  3-in-1 ordered through [employer] PT:  [facility] in [place].  PT eval scheduled on 10-11-19.                   DME Arranged:  3-N-1 DME Agency:  [employer]  HH Arranged:  NA HH Agency:  NA  Additional Comments: Please contact me with any questions of if this plan should need to change.  Ennis Forts, RN,CCM [facility]  [phone] 10/08/2019, 3:23 PM

## 2019-10-08 NOTE — Anesthesia Procedure Notes (Signed)
Spinal  Patient location during procedure: OR Start time: 10/08/2019 7:16 AM End time: 10/08/2019 7:21 AM Staffing Performed: anesthesiologist  Anesthesiologist: Lowella Curb, MD Preanesthetic Checklist Completed: patient identified, IV checked, site marked, risks and benefits discussed, surgical consent, monitors and equipment checked, pre-op evaluation and timeout performed Spinal Block Patient position: sitting Prep: DuraPrep Patient monitoring: heart rate, cardiac monitor, continuous pulse ox and blood pressure Approach: right paramedian Location: L3-4 Injection technique: single-shot Needle Needle type: Quincke  Needle gauge: 22 G Needle length: 9 cm Assessment Sensory level: T4 Additional Notes Unable to obtain CSF with 24g.  Switched to 22g Quincke with paramedian approach

## 2019-10-08 NOTE — Op Note (Signed)
OPERATIVE REPORT-TOTAL KNEE ARTHROPLASTY   Pre-operative diagnosis- Osteoarthritis  Left knee(s)  Post-operative diagnosis- Osteoarthritis Left knee(s)  Procedure-  Left  Total Knee Arthroplasty  Surgeon- Gus Rankin. Kalisa Girtman, MD  Assistant- Leilani Able, PA-C   Anesthesia-  Adductor canal block and spinal  EBL-50 mL   Drains None  Tourniquet time-  Total Tourniquet Time Documented: Thigh (Left) - 31 minutes Total: Thigh (Left) - 31 minutes     Complications- None  Condition-PACU - hemodynamically stable.   Brief Clinical Note  Renee Young is a 74 y.o. year old female with end stage OA of her left knee with progressively worsening pain and dysfunction. She has constant pain, with activity and at rest and significant functional deficits with difficulties even with ADLs. She has had extensive non-op management including analgesics, injections of cortisone and viscosupplements, and home exercise program, but remains in significant pain with significant dysfunction. Radiographs show bone on bone arthritis medial and patellofemoral. She presents now for left Total Knee Arthroplasty.    Procedure in detail---   The patient is brought into the operating room and positioned supine on the operating table. After successful administration of  Adductor canal block and spinal,   a tourniquet is placed high on the  Left thigh(s) and the lower extremity is prepped and draped in the usual sterile fashion. Time out is performed by the operating team and then the  Left lower extremity is wrapped in Esmarch, knee flexed and the tourniquet inflated to 300 mmHg.       A midline incision is made with a ten blade through the subcutaneous tissue to the level of the extensor mechanism. A fresh blade is used to make a medial parapatellar arthrotomy. Soft tissue over the proximal medial tibia is subperiosteally elevated to the joint line with a knife and into the semimembranosus bursa with a Cobb elevator.  Soft tissue over the proximal lateral tibia is elevated with attention being paid to avoiding the patellar tendon on the tibial tubercle. The patella is everted, knee flexed 90 degrees and the ACL and PCL are removed. Findings are bone on bone medial and patellofemoral with large global osteophytes        The drill is used to create a starting hole in the distal femur and the canal is thoroughly irrigated with sterile saline to remove the fatty contents. The 5 degree Left  valgus alignment guide is placed into the femoral canal and the distal femoral cutting block is pinned to remove 9 mm off the distal femur. Resection is made with an oscillating saw.      The tibia is subluxed forward and the menisci are removed. The extramedullary alignment guide is placed referencing proximally at the medial aspect of the tibial tubercle and distally along the second metatarsal axis and tibial crest. The block is pinned to remove 49mm off the more deficient medial  side. Resection is made with an oscillating saw. Size 5is the most appropriate size for the tibia and the proximal tibia is prepared with the modular drill and keel punch for that size.      The femoral sizing guide is placed and size 5 is most appropriate. Rotation is marked off the epicondylar axis and confirmed by creating a rectangular flexion gap at 90 degrees. The size 5 cutting block is pinned in this rotation and the anterior, posterior and chamfer cuts are made with the oscillating saw. The intercondylar block is then placed and that cut is made.  Trial size 5 tibial component, trial size 5 narrow posterior stabilized femur and a 8  mm posterior stabilized rotating platform insert trial is placed. Full extension is achieved with excellent varus/valgus and anterior/posterior balance throughout full range of motion. The patella is everted and thickness measured to be 22  mm. Free hand resection is taken to 12 mm, a 35 template is placed, lug holes are  drilled, trial patella is placed, and it tracks normally. Osteophytes are removed off the posterior femur with the trial in place. All trials are removed and the cut bone surfaces prepared with pulsatile lavage. Cement is mixed and once ready for implantation, the size 5 tibial implant, size  5 narrow posterior stabilized femoral component, and the size 35 patella are cemented in place and the patella is held with the clamp. The trial insert is placed and the knee held in full extension. The Exparel (20 ml mixed with 60 ml saline) is injected into the extensor mechanism, posterior capsule, medial and lateral gutters and subcutaneous tissues.  All extruded cement is removed and once the cement is hard the permanent 8 mm posterior stabilized rotating platform insert is placed into the tibial tray.      The wound is copiously irrigated with saline solution and the extensor mechanism closed with # 0 Stratofix suture. The tourniquet is released for a total tourniquet time of 31  minutes. Flexion against gravity is 140 degrees and the patella tracks normally. Subcutaneous tissue is closed with 2.0 vicryl and subcuticular with running 4.0 Monocryl. The incision is cleaned and dried and steri-strips and a bulky sterile dressing are applied. The limb is placed into a knee immobilizer and the patient is awakened and transported to recovery in stable condition.      Please note that a surgical assistant was a medical necessity for this procedure in order to perform it in a safe and expeditious manner. Surgical assistant was necessary to retract the ligaments and vital neurovascular structures to prevent injury to them and also necessary for proper positioning of the limb to allow for anatomic placement of the prosthesis.   Gus Rankin Edgard Debord, MD    10/08/2019, 8:14 AM

## 2019-10-08 NOTE — Evaluation (Signed)
Physical Therapy Evaluation Patient Details Name: Renee Young MRN: [id number] DOB: [birth year] Today's Date: 10/08/2019   History of Present Illness  s/p L TKA. PMH: ADD, DM  Clinical Impression  Pt is s/p TKA resulting in the deficits listed below (see PT Problem List).  Pt motivated and agreeable to PT however unable to amb/progress mobility d/t dizziness despite stable BP. Will attempt to see again when and if schedule allows   Pt will benefit from skilled PT to increase their independence and safety with mobility to allow discharge to the venue listed below.      Follow Up Recommendations Follow surgeon's recommendation for DC plan and follow-up therapies    Equipment Recommendations  3in1 (PT)    Recommendations for Other Services       Precautions / Restrictions Precautions Precautions: Fall;Knee Required Braces or Orthoses: Knee Immobilizer - Left Knee Immobilizer - Right: Discontinue once straight leg raise with < 10 degree lag Knee Immobilizer - Left: Discontinue once straight leg raise with < 10 degree lag Restrictions Weight Bearing Restrictions: No Other Position/Activity Restrictions: WBAT      Mobility  Bed Mobility Overal bed mobility: Needs Assistance Bed Mobility: Supine to Sit;Sit to Supine     Supine to sit: Min assist;HOB elevated Sit to supine: Mod assist;+2 for safety/equipment;+2 for physical assistance   General bed mobility comments: assist wtih RLE and to elevate trunk; +2 to return to supine d/t pt dizziness/near LOC on edge of stretcher  Transfers Overall transfer level: Needs assistance Equipment used: Rolling walker (2 wheeled) Transfers: Sit to/from [employer] Sit to Stand: Min assist Stand pivot transfers: Min assist;+2 safety/equipment;+2 physical assistance       General transfer comment: cues for hand placement and RLE position for sit to stand. min of 2 for safety to perform stand pivot x3 stretcher to [facility]  to stretcher, stretcher to recliner; pt continued to complain of dizziness, feeeling that she was going to lose conciousness. BP 130/64 in supine, unabel to get sitting BP d/t pt feeling she was "going to pass out"  Ambulation/Gait             General Gait Details: unable d/t dizziness  Stairs            Wheelchair Mobility    Modified Rankin (Stroke Patients Only)       Balance Overall balance assessment: Needs assistance         Standing balance support: During functional activity;Bilateral upper extremity supported Standing balance-Leahy Scale: Poor                               Pertinent Vitals/Pain Pain Assessment: 0-10 Pain Score: 3  Pain Location: left knee Pain Descriptors / Indicators: Sore;Throbbing Pain Intervention(s): Limited activity within patient's tolerance;Monitored during session;Premedicated before session;Repositioned    Home Living Family/patient expects to be discharged to:: Private residence Living Arrangements: Spouse/significant other;Children Available Help at Discharge: Family Type of Home: House Home Access: Stairs to enter Entrance Stairs-Rails: Doctor, general practice of Steps: 3 Home Layout: One level Home Equipment: Environmental consultant - 2 wheels;Cane - single point;Bedside commode      Prior Function Level of Independence: Independent               Hand Dominance        Extremity/Trunk Assessment   Upper Extremity Assessment Upper Extremity Assessment: Overall WFL for tasks assessed    Lower Extremity  Assessment Lower Extremity Assessment: LLE deficits/detail RLE Deficits / Details: grossly WFL LLE Deficits / Details: knee extension and hip flexion 2+/5, ~8 degree quad lag.  ankle WFL       Communication   Communication: No difficulties  Cognition Arousal/Alertness: Awake/alert Behavior During Therapy: WFL for tasks assessed/performed Overall Cognitive Status: Within Functional Limits for  tasks assessed                                        General Comments      Exercises Total Joint Exercises Ankle Circles/Pumps: AROM;Both;10 reps Quad Sets: AROM;Both;5 reps Straight Leg Raises: AAROM;10 reps;Left   Assessment/Plan    PT Assessment Patient needs continued PT services  PT Problem List Decreased strength;Decreased range of motion;Decreased activity tolerance;Decreased balance;Decreased mobility;Decreased knowledge of use of DME;Pain       PT Treatment Interventions DME instruction;Therapeutic exercise;Gait training;Functional mobility training;Therapeutic activities;Patient/family education;Stair training    PT Goals (Current goals can be found in the Care Plan section)  Acute Rehab PT Goals Patient Stated Goal: to feel better and go home if possible PT Goal Formulation: With patient Time For Goal Achievement: 10/15/19 Potential to Achieve Goals: Good    Frequency 7X/week   Barriers to discharge        Co-evaluation               AM-PAC PT "6 Clicks" Mobility  Outcome Measure Help needed turning from your back to your side while in a flat bed without using bedrails?: A Little Help needed moving from lying on your back to sitting on the side of a flat bed without using bedrails?: A Little Help needed moving to and from a bed to a chair (including a wheelchair)?: A Lot Help needed standing up from a chair using your arms (e.g., wheelchair or bedside chair)?: A Lot Help needed to walk in hospital room?: Total Help needed climbing 3-5 steps with a railing? : Total 6 Click Score: 12    End of Session Equipment Utilized During Treatment: Gait belt;Left knee immobilizer Activity Tolerance: Treatment limited secondary to medical complications (Comment) Patient left: in chair;with call bell/phone within reach;with nursing/sitter in room Nurse Communication: Mobility status PT Visit Diagnosis: Difficulty in walking, not elsewhere  classified (R26.2);Other abnormalities of gait and mobility (R26.89)    Time: 3875-6433 PT Time Calculation (min) (ACUTE ONLY): 24 min   Charges:   PT Evaluation $PT Eval Low Complexity: 1 Low PT Treatments $Therapeutic Activity: 8-22 mins        Delice Bison, PT  Acute Rehab Dept ([facility]) [phone] Pager [phone]  10/08/2019   [facility] 10/08/2019, 12:33 PM

## 2019-10-08 NOTE — Transfer of Care (Signed)
Immediate Anesthesia Transfer of Care Note  Patient: Renee Young  Procedure(s) Performed: TOTAL KNEE ARTHROPLASTY (Left Knee)  Patient Location: PACU  Anesthesia Type:Spinal  Level of Consciousness: awake  Airway & Oxygen Therapy: Patient Spontanous Breathing and Patient connected to face mask oxygen  Post-op Assessment: Report given to RN and Post -op Vital signs reviewed and stable  Post vital signs: Reviewed and stable  Last Vitals:  Vitals Value Taken Time  BP 100/59 10/08/19 0836  Temp    Pulse 80 10/08/19 0837  Resp 13 10/08/19 0837  SpO2 98 % 10/08/19 0837  Vitals shown include unvalidated device data.  Last Pain:  Vitals:   10/08/19 0553  TempSrc: Oral      Patients Stated Pain Goal: 4 (10/08/19 0600)  Complications: No complications documented.

## 2019-10-08 NOTE — Anesthesia Preprocedure Evaluation (Signed)
Anesthesia Evaluation  Patient identified by MRN, date of birth, ID band Patient awake    Reviewed: Allergy & Precautions, NPO status , Patient's Chart, lab work & pertinent test results  Airway Mallampati: II  TM Distance: >3 FB Neck ROM: Full    Dental no notable dental hx.    Pulmonary neg pulmonary ROS,    Pulmonary exam normal breath sounds clear to auscultation       Cardiovascular negative cardio ROS Normal cardiovascular exam Rhythm:Regular Rate:Normal     Neuro/Psych Depression negative neurological ROS  negative psych ROS   GI/Hepatic Neg liver ROS, GERD  ,  Endo/Other  negative endocrine ROSdiabetes, Type 2, Oral Hypoglycemic Agents  Renal/GU negative Renal ROS  negative genitourinary   Musculoskeletal  (+) Arthritis , Osteoarthritis,    Abdominal   Peds negative pediatric ROS (+)  Hematology negative hematology ROS (+)   Anesthesia Other Findings   Reproductive/Obstetrics negative OB ROS                             Anesthesia Physical Anesthesia Plan  ASA: III  Anesthesia Plan: Spinal   Post-op Pain Management:  Regional for Post-op pain   Induction: Intravenous  PONV Risk Score and Plan: 2 and Ondansetron, Midazolam and Treatment may vary due to age or medical condition  Airway Management Planned: Simple Face Mask  Additional Equipment:   Intra-op Plan:   Post-operative Plan:   Informed Consent: I have reviewed the patients History and Physical, chart, labs and discussed the procedure including the risks, benefits and alternatives for the proposed anesthesia with the patient or authorized representative who has indicated his/her understanding and acceptance.     Dental advisory given  Plan Discussed with: CRNA  Anesthesia Plan Comments:         Anesthesia Quick Evaluation

## 2019-10-08 NOTE — Anesthesia Procedure Notes (Signed)
Procedure Name: MAC Date/Time: 10/08/2019 7:10 AM Performed by: Niel Hummer, CRNA Pre-anesthesia Checklist: Patient identified, Emergency Drugs available, Suction available and Patient being monitored Oxygen Delivery Method: Simple face mask

## 2019-10-08 NOTE — Discharge Instructions (Addendum)
 Renee Aluisio, MD Total Joint Specialist [facility] [place] [address] [place], [place] [address] [phone]  TOTAL KNEE REPLACEMENT POSTOPERATIVE DIRECTIONS    Knee Rehabilitation, Guidelines Following Surgery  Results after knee surgery are often greatly improved when you follow the exercise, range of motion and muscle strengthening exercises prescribed by your doctor. Safety measures are also important to protect the knee from further injury. If any of these exercises cause you to have increased pain or swelling in your knee joint, decrease the amount until you are comfortable again and slowly increase them. If you have problems or questions, call your caregiver or physical therapist for advice.   BLOOD CLOT PREVENTION . Take a 325 mg Aspirin two times a day for three weeks following surgery. Then take an 81 mg Aspirin once a day for three weeks. Then discontinue Aspirin. . You may resume your vitamins/supplements upon discharge from the hospital. . Do not take any NSAIDs (Advil, Aleve, Ibuprofen, Meloxicam, etc.) until you have discontinued the 325 mg Aspirin.  HOME CARE INSTRUCTIONS  . Remove items at home which could result in a fall. This includes throw rugs or furniture in walking pathways.  . ICE to the affected knee as much as tolerated. Icing helps control swelling. If the swelling is well controlled you will be more comfortable and rehab easier. Continue to use ice on the knee for pain and swelling from surgery. You may notice swelling that will progress down to the foot and ankle. This is normal after surgery. Elevate the leg when you are not up walking on it.    . Continue to use the breathing machine which will help keep your temperature down. It is common for your temperature to cycle up and down following surgery, especially at night when you are not up moving around and exerting yourself. The breathing machine keeps your lungs expanded and your  temperature down. . Do not place pillow under the operative knee, focus on keeping the knee straight while resting  DIET You may resume your previous home diet once you are discharged from the hospital.  DRESSING / WOUND CARE / SHOWERING . Keep your bulky bandage on for 2 days. On the third post-operative day you may remove the Ace bandage and gauze. There is a waterproof adhesive bandage on your skin which will stay in place until your first follow-up appointment. Once you remove this you will not need to place another bandage . You may begin showering 3 days following surgery, but do not submerge the incision under water.  ACTIVITY For the first 5 days, the key is rest and control of pain and swelling . Do your home exercises twice a day starting on post-operative day 3. On the days you go to physical therapy, just do the home exercises once that day. . You should rest, ice and elevate the leg for 50 minutes out of every hour. Get up and walk/stretch for 10 minutes per hour. After 5 days you can increase your activity slowly as tolerated. . Walk with your walker as instructed. Use the walker until you are comfortable transitioning to a cane. Walk with the cane in the opposite hand of the operative leg. You may discontinue the cane once you are comfortable and walking steadily. . Avoid periods of inactivity such as sitting longer than an hour when not asleep. This helps prevent blood clots.  . You may discontinue the knee immobilizer once you are able to perform a straight   leg raise while lying down. . You may resume a sexual relationship in one month or when given the OK by your doctor.  . You may return to work once you are cleared by your doctor.  . Do not drive a car for 6 weeks or until released by your surgeon.  . Do not drive while taking narcotics.  TED HOSE STOCKINGS Wear the elastic stockings on both legs for three weeks following surgery during the day. You may remove them at night  for sleeping.  WEIGHT BEARING Weight bearing as tolerated with assist device (walker, cane, etc) as directed, use it as long as suggested by your surgeon or therapist, typically at least 4-6 weeks.  POSTOPERATIVE CONSTIPATION PROTOCOL Constipation - defined medically as fewer than three stools per week and severe constipation as less than one stool per week.  One of the most common issues patients have following surgery is constipation.  Even if you have a regular bowel pattern at home, your normal regimen is likely to be disrupted due to multiple reasons following surgery.  Combination of anesthesia, postoperative narcotics, change in appetite and fluid intake all can affect your bowels.  In order to avoid complications following surgery, here are some recommendations in order to help you during your recovery period.  . Colace (docusate) - Pick up an over-the-counter form of Colace or another stool softener and take twice a day as long as you are requiring postoperative pain medications.  Take with a full glass of water daily.  If you experience loose stools or diarrhea, hold the colace until you stool forms back up. If your symptoms do not get better within 1 week or if they get worse, check with your doctor. . Dulcolax (bisacodyl) - Pick up over-the-counter and take as directed by the product packaging as needed to assist with the movement of your bowels.  Take with a full glass of water.  Use this product as needed if not relieved by Colace only.  . MiraLax (polyethylene glycol) - Pick up over-the-counter to have on hand. MiraLax is a solution that will increase the amount of water in your bowels to assist with bowel movements.  Take as directed and can mix with a glass of water, juice, soda, coffee, or tea. Take if you go more than two days without a movement. Do not use MiraLax more than once per day. Call your doctor if you are still constipated or irregular after using this medication for 7 days  in a row.  If you continue to have problems with postoperative constipation, please contact the office for further assistance and recommendations.  If you experience "the worst abdominal pain ever" or develop nausea or vomiting, please contact the office immediatly for further recommendations for treatment.  ITCHING If you experience itching with your medications, try taking only a single pain pill, or even half a pain pill at a time.  You can also use Benadryl over the counter for itching or also to help with sleep.   MEDICATIONS See your medication summary on the "After Visit Summary" that the nursing staff will review with you prior to discharge.  You may have some home medications which will be placed on hold until you complete the course of blood thinner medication.  It is important for you to complete the blood thinner medication as prescribed by your surgeon.  Continue your approved medications as instructed at time of discharge.  PRECAUTIONS . If you experience chest pain or shortness of   breath - call 911 immediately for transfer to the hospital emergency department.  . If you develop a fever greater that 101 F, purulent drainage from wound, increased redness or drainage from wound, foul odor from the wound/dressing, or calf pain - CONTACT YOUR SURGEON.                                                   FOLLOW-UP APPOINTMENTS Make sure you keep all of your appointments after your operation with your surgeon and caregivers. You should call the office at the above phone number and make an appointment for approximately two weeks after the date of your surgery or on the date instructed by your surgeon outlined in the "After Visit Summary".  RANGE OF MOTION AND STRENGTHENING EXERCISES  Rehabilitation of the knee is important following a knee injury or an operation. After just a few days of immobilization, the muscles of the thigh which control the knee become weakened and shrink (atrophy). Knee  exercises are designed to build up the tone and strength of the thigh muscles and to improve knee motion. Often times heat used for twenty to thirty minutes before working out will loosen up your tissues and help with improving the range of motion but do not use heat for the first two weeks following surgery. These exercises can be done on a training (exercise) mat, on the floor, on a table or on a bed. Use what ever works the best and is most comfortable for you Knee exercises include:  . Leg Lifts - While your knee is still immobilized in a splint or cast, you can do straight leg raises. Lift the leg to 60 degrees, hold for 3 sec, and slowly lower the leg. Repeat 10-20 times 2-3 times daily. Perform this exercise against resistance later as your knee gets better.  . Quad and Hamstring Sets - Tighten up the muscle on the front of the thigh (Quad) and hold for 5-10 sec. Repeat this 10-20 times hourly. Hamstring sets are done by pushing the foot backward against an object and holding for 5-10 sec. Repeat as with quad sets.   Leg Slides: Lying on your back, slowly slide your foot toward your buttocks, bending your knee up off the floor (only go as far as is comfortable). Then slowly slide your foot back down until your leg is flat on the floor again.  Angel Wings: Lying on your back spread your legs to the side as far apart as you can without causing discomfort.  A rehabilitation program following serious knee injuries can speed recovery and prevent re-injury in the future due to weakened muscles. Contact your doctor or a physical therapist for more information on knee rehabilitation.   IF YOU ARE TRANSFERRED TO A SKILLED REHAB FACILITY If the patient is transferred to a skilled rehab facility following release from the hospital, a list of the current medications will be sent to the facility for the patient to continue.  When discharged from the skilled rehab facility, please have the facility set up the  patient's Home Health Physical Therapy prior to being released. Also, the skilled facility will be responsible for providing the patient with their medications at time of release from the facility to include their pain medication, the muscle relaxants, and their blood thinner medication. If the patient is still at the   rehab facility at time of the two week follow up appointment, the skilled rehab facility will also need to assist the patient in arranging follow up appointment in our office and any transportation needs.  MAKE SURE YOU:  . Understand these instructions.  . Get help right away if you are not doing well or get worse.   DENTAL ANTIBIOTICS:  In most cases prophylactic antibiotics for Dental procdeures after total joint surgery are not necessary.  Exceptions are as follows:  1. History of prior total joint infection  2. Severely immunocompromised (Organ Transplant, cancer chemotherapy, Rheumatoid biologic meds such as Humera)  3. Poorly controlled diabetes (A1C &gt; 8.0, blood glucose over 200)  If you have one of these conditions, contact your surgeon for an antibiotic prescription, prior to your dental procedure.    Pick up stool softner and laxative for home use following surgery while on pain medications. Do not submerge incision under water. Please use good hand washing techniques while changing dressing each day. May shower starting three days after surgery. Please use a clean towel to pat the incision dry following showers. Continue to use ice for pain and swelling after surgery. Do not use any lotions or creams on the incision until instructed by your surgeon.  

## 2019-10-08 NOTE — Interval H&P Note (Signed)
History and Physical Interval Note:  10/08/2019 6:49 AM  Renee Young  has presented today for surgery, with the diagnosis of Left knee osteoarthritis.  The various methods of treatment have been discussed with the patient and family. After consideration of risks, benefits and other options for treatment, the patient has consented to  Procedure(s): TOTAL KNEE ARTHROPLASTY (Left) as a surgical intervention.  The patient's history has been reviewed, patient examined, no change in status, stable for surgery.  I have reviewed the patient's chart and labs.  Questions were answered to the patient's satisfaction.     Homero Fellers Zethan Alfieri

## 2019-10-08 NOTE — Anesthesia Postprocedure Evaluation (Signed)
Anesthesia Post Note  Patient: Renee Young  Procedure(s) Performed: TOTAL KNEE ARTHROPLASTY (Left Knee)     Patient location during evaluation: PACU Anesthesia Type: Spinal Level of consciousness: awake and alert Pain management: pain level controlled Vital Signs Assessment: post-procedure vital signs reviewed and stable Respiratory status: spontaneous breathing, nonlabored ventilation and respiratory function stable Cardiovascular status: blood pressure returned to baseline and stable Postop Assessment: no apparent nausea or vomiting Anesthetic complications: no   No complications documented.  Last Vitals:  Vitals:   10/08/19 1000 10/08/19 1040  BP: 136/82 (P) 103/81  Pulse: 83   Resp: 17 (P) 16  Temp: 36.5 C   SpO2: 91%     Last Pain:  Vitals:   10/08/19 1000  TempSrc:   PainSc: 2                  Lowella Curb

## 2019-10-08 NOTE — Anesthesia Procedure Notes (Signed)
Anesthesia Regional Block: Adductor canal block   Pre-Anesthetic Checklist: ,, timeout performed, Correct Patient, Correct Site, Correct Laterality, Correct Procedure, Correct Position, site marked, Risks and benefits discussed,  Surgical consent,  Pre-op evaluation,  At surgeon's request and post-op pain management  Laterality: Left  Prep: chloraprep       Needles:  Injection technique: Single-shot  Needle Type: Stimiplex     Needle Length: 9cm  Needle Gauge: 21     Additional Needles:   Procedures:,,,, ultrasound used (permanent image in chart),,,,  Narrative:  Start time: 10/08/2019 6:54 AM End time: 10/08/2019 6:59 AM Injection made incrementally with aspirations every 5 mL.  Performed by: Personally  Anesthesiologist: Lowella Curb, MD

## 2019-10-08 NOTE — OR Nursing (Signed)
 clear yellow urine out at end of case.

## 2019-10-08 NOTE — Progress Notes (Signed)
Orthopedic Tech Progress Note Patient Details:  Renee Young April 22, 1945 [id number]  CPM Left Knee CPM Left Knee: On Left Knee Flexion (Degrees): 40 Left Knee Extension (Degrees): 10 Additional Comments: on cpm at 4:50  Post Interventions Patient Tolerated: Well Instructions Provided: Care of device Ortho Devices Type of Ortho Device: CPM padding Ortho Device/Splint Interventions: Ordered, Application, Adjustment   Post Interventions Patient Tolerated: Well Instructions Provided: Care of device   [name] 10/08/2019, 4:57 PM

## 2019-10-08 NOTE — Progress Notes (Addendum)
PACU: Phase 2   Patient became dizzy upon standing when working with PT. BP remained stable 130/64. Patient moved to recliner with assistance. PT will try to work with patient again once patient's symptoms resolve. Will continue to monitor.   Ezzard Standing, RN

## 2019-10-09 ENCOUNTER — Encounter (HOSPITAL_COMMUNITY): Payer: Self-pay | Admitting: Orthopedic Surgery

## 2019-10-09 DIAGNOSIS — Z79899 Other long term (current) drug therapy: Secondary | ICD-10-CM | POA: Diagnosis not present

## 2019-10-09 DIAGNOSIS — M1712 Unilateral primary osteoarthritis, left knee: Secondary | ICD-10-CM | POA: Diagnosis not present

## 2019-10-09 DIAGNOSIS — F909 Attention-deficit hyperactivity disorder, unspecified type: Secondary | ICD-10-CM | POA: Diagnosis not present

## 2019-10-09 DIAGNOSIS — Z7984 Long term (current) use of oral hypoglycemic drugs: Secondary | ICD-10-CM | POA: Diagnosis not present

## 2019-10-09 DIAGNOSIS — E1165 Type 2 diabetes mellitus with hyperglycemia: Secondary | ICD-10-CM | POA: Diagnosis not present

## 2019-10-09 LAB — BASIC METABOLIC PANEL
Anion gap: 10 (ref 5–15)
BUN: 16 mg/dL (ref 8–23)
CO2: 26 mmol/L (ref 22–32)
Calcium: 9.4 mg/dL (ref 8.9–10.3)
Chloride: 101 mmol/L (ref 98–111)
Creatinine, Ser: 0.88 mg/dL (ref 0.44–1.00)
GFR calc Af Amer: >60 mL/min (ref >60)
GFR calc non Af Amer: >60 mL/min (ref >60)
Glucose, Bld: 196 mg/dL — ABNORMAL HIGH (ref 70–99)
Potassium: 4.1 mmol/L (ref 3.5–5.1)
Sodium: 137 mmol/L (ref 135–145)

## 2019-10-09 LAB — GLUCOSE, CAPILLARY
Glucose-Capillary: 148 mg/dL — ABNORMAL HIGH (ref 70–99)
Glucose-Capillary: 145 mg/dL — ABNORMAL HIGH (ref 70–99)

## 2019-10-09 LAB — CBC
HCT: 35.6 % — ABNORMAL LOW (ref 36.0–46.0)
Hemoglobin: 11.3 g/dL — ABNORMAL LOW (ref 12.0–15.0)
MCH: 25.6 pg — ABNORMAL LOW (ref 26.0–34.0)
MCHC: 31.7 g/dL (ref 30.0–36.0)
MCV: 80.7 fL (ref 80.0–100.0)
Platelets: 275 10*3/uL (ref 150–400)
RBC: 4.41 MIL/uL (ref 3.87–5.11)
RDW: 14.7 % (ref 11.5–15.5)
WBC: 10.1 10*3/uL (ref 4.0–10.5)
nRBC: 0 % (ref 0.0–0.2)

## 2019-10-09 MED ORDER — ASPIRIN EC 325 MG PO TBEC: 325.0000 mg | DELAYED_RELEASE_TABLET | Freq: Two times a day (BID) | ORAL | 0 refills | Status: AC

## 2019-10-09 NOTE — Progress Notes (Signed)
Orthopedic Tech Progress Note Patient Details:  Renee Young 08-08-1945 [id number] Pick up cpm Patient ID: Renee Young, female   DOB: [birth year], 74 y.o.   MRN: [id number]   [name] 10/09/2019, 3:26 PM

## 2019-10-09 NOTE — Progress Notes (Signed)
   Subjective: 1 Day Post-Op Procedure(s) (LRB): TOTAL KNEE ARTHROPLASTY (Left) Patient reports pain as mild.   Patient seen in rounds by Dr. Lequita Halt. Patient is well, and has had no acute complaints or problems. Had dizziness with physical therapy yesterday, prompting overnight stay. Denies chest pain or SOB. Feeling better this morning. Voiding without difficulty. We will continue therapy today.   Objective: Vital signs in last 24 hours: Temp:  [97.5 F (36.4 C)-98.2 F (36.8 C)] 98 F (36.7 C) (08/24 0525) Pulse Rate:  [76-107] 98 (08/24 0525) Resp:  [12-20] 20 (08/24 0525) BP: (100-155)/(59-82) 140/63 (08/24 0525) SpO2:  [91 %-99 %] 93 % (08/24 0525)  Intake/Output from previous day:  Intake/Output Summary (Last 24 hours) at 10/09/2019 0708 Last data filed at 10/09/2019 0600 Gross per 24 hour  Intake 4555 ml  Output 2600 ml  Net 1955 ml     Intake/Output this shift: No intake/output data recorded.  Labs: Recent Labs    10/09/19 0302  HGB 11.3*   Recent Labs    10/09/19 0302  WBC 10.1  RBC 4.41  HCT 35.6*  PLT 275   Recent Labs    10/09/19 0302  NA 137  K 4.1  CL 101  CO2 26  BUN 16  CREATININE 0.88  GLUCOSE 196*  CALCIUM 9.4   No results for input(s): LABPT, INR in the last 72 hours.  Exam: General - Patient is Alert and Oriented Extremity - Neurologically intact Neurovascular intact Sensation intact distally Dorsiflexion/Plantar flexion intact Dressing - dressing C/D/I Motor Function - intact, moving foot and toes well on exam.   Past Medical History:  Diagnosis Date  . ADD (attention deficit disorder)   . Allergy   . Anemia   . Arthritis   . Chicken pox   . Colon polyps   . Depression   . Diabetes mellitus without complication (HCC)   . GERD (gastroesophageal reflux disease)   . Kidney stones   . Phlebitis   . UTI (urinary tract infection)   . UTI (urinary tract infection)     Assessment/Plan: 1 Day Post-Op Procedure(s)  (LRB): TOTAL KNEE ARTHROPLASTY (Left) Principal Problem:   OA (osteoarthritis) of knee Active Problems:   S/P total knee arthroplasty  Estimated body mass index is 29.5 kg/m as calculated from the following:   Height as of this encounter: 5\' 5"  (1.651 m).   Weight as of this encounter: 80.4 kg. Advance diet Up with therapy D/C IV fluids   Patient's anticipated LOS is less than 2 midnights, meeting these requirements: - Lives within 1 hour of care - Has a competent adult at home to recover with post-op recover - NO history of  - Chronic pain requiring opioids  - Coronary Artery Disease  - Heart failure  - Heart attack  - Stroke  - DVT/VTE  - Cardiac arrhythmia  - Respiratory Failure/COPD  - Renal failure  - Anemia  - Advanced Liver disease  DVT Prophylaxis - Aspirin Weight bearing as tolerated. Continue therapy.  Plan is to go Home after hospital stay. Plan for discharge today once cleared by physical therapy. Scheduled for OPPT at [facility] in [place]. Follow-up in the office September 7th.  The PDMP database was reviewed yesterday prior to any opioid medications being prescribed to this patient.   12-12-1978, PA-C Orthopedic Surgery [phone] 10/09/2019, 7:08 AM

## 2019-10-09 NOTE — Progress Notes (Signed)
Physical Therapy Treatment Patient Details Name: Renee Young MRN: [id number] DOB: 1945-11-13 Today's Date: 10/09/2019    History of Present Illness s/p L TKA. PMH: ADD, DM    PT Comments    Pt feeling much better overall today. amb 35' x2, fatigue but denies dizziness.  Will see for a second session and pt should be ready to d/c home later this afternoon.  Follow Up Recommendations  Follow surgeon's recommendation for DC plan and follow-up therapies     Equipment Recommendations  3in1 (PT)    Recommendations for Other Services       Precautions / Restrictions Precautions Precautions: Fall;Knee Required Braces or Orthoses: Knee Immobilizer - Left Knee Immobilizer - Right: Discontinue once straight leg raise with < 10 degree lag Knee Immobilizer - Left: Discontinue once straight leg raise with < 10 degree lag Restrictions Weight Bearing Restrictions: No Other Position/Activity Restrictions: WBAT    Mobility  Bed Mobility Overal bed mobility: Needs Assistance Bed Mobility: Supine to Sit     Supine to sit: Min guard;HOB elevated     General bed mobility comments: incr time, min/guard for safety   Transfers Overall transfer level: Needs assistance Equipment used: Rolling walker (2 wheeled) Transfers: Sit to/from Stand Sit to Stand: Min guard;Min assist         General transfer comment: cues for hand placement, assist to rise and transition safely to RW  Ambulation/Gait Ambulation/Gait assistance: Min guard;Supervision Gait Distance (Feet): 35 Feet (x2) Assistive device: Rolling walker (2 wheeled) Gait Pattern/deviations: Step-to pattern;Decreased stance time - left Gait velocity: decr   General Gait Details: cues for sequence and safety. seated rest d/t fatigue,  denies dizziness    Stairs             Wheelchair Mobility    Modified Rankin (Stroke Patients Only)       Balance                                             Cognition Arousal/Alertness: Awake/alert Behavior During Therapy: WFL for tasks assessed/performed Overall Cognitive Status: Within Functional Limits for tasks assessed                                        Exercises      General Comments        Pertinent Vitals/Pain Pain Assessment: 0-10 Pain Score: 3  Pain Location: left knee Pain Descriptors / Indicators: Sore;Aching Pain Intervention(s): Limited activity within patient's tolerance;Monitored during session;Premedicated before session;Repositioned    Home Living                      Prior Function            PT Goals (current goals can now be found in the care plan section) Acute Rehab PT Goals Patient Stated Goal: to feel better and go home if possible PT Goal Formulation: With patient Time For Goal Achievement: 10/15/19 Potential to Achieve Goals: Good Progress towards PT goals: Progressing toward goals    Frequency    7X/week      PT Plan Current plan remains appropriate    Co-evaluation              AM-PAC PT "6 Clicks" Mobility   Outcome  Measure  Help needed turning from your back to your side while in a flat bed without using bedrails?: None Help needed moving from lying on your back to sitting on the side of a flat bed without using bedrails?: None Help needed moving to and from a bed to a chair (including a wheelchair)?: A Little Help needed standing up from a chair using your arms (e.g., wheelchair or bedside chair)?: A Little Help needed to walk in hospital room?: A Little Help needed climbing 3-5 steps with a railing? : A Little 6 Click Score: 20    End of Session Equipment Utilized During Treatment: Gait belt;Left knee immobilizer Activity Tolerance: Patient tolerated treatment well Patient left: in chair;with call bell/phone within reach;with chair alarm set Nurse Communication: Mobility status PT Visit Diagnosis: Difficulty in walking, not elsewhere  classified (R26.2);Other abnormalities of gait and mobility (R26.89)     Time: 9163-8466 PT Time Calculation (min) (ACUTE ONLY): 20 min  Charges:  $Gait Training: 8-22 mins                     Delice Bison, PT  Acute Rehab Dept ([facility]) [phone] Pager [phone]  10/09/2019    [facility] 10/09/2019, 11:28 AM

## 2019-10-09 NOTE — Progress Notes (Signed)
Pt is Ortho Bundle.  Met briefly and confirming plan for OPPT at [facility] in [place].  Pt is borrowing rw and receiving 3n1 via [facility].  No TOC needs.  Peyton Spengler, LCSW

## 2019-10-09 NOTE — Progress Notes (Signed)
Orthopedic Tech Progress Note Patient Details:  Renee Young 1945/10/22 [id number]  Patient ID: Renee Young, female   DOB: December 23, 1945, 74 y.o.   MRN: [id number]   Renee Young 10/09/2019, 7:12 AMworking with PT

## 2019-10-10 DIAGNOSIS — Z96652 Presence of left artificial knee joint: Secondary | ICD-10-CM | POA: Diagnosis not present

## 2019-10-10 DIAGNOSIS — M1712 Unilateral primary osteoarthritis, left knee: Secondary | ICD-10-CM | POA: Diagnosis not present

## 2019-10-10 NOTE — Discharge Summary (Signed)
Physician Discharge Summary   Patient ID: Renee Young MRN: [id number] DOB/AGE: 73/05/1945 73 y.o.  Admit date: 10/08/2019 Discharge date: 10/09/2019  Primary Diagnosis: Osteoarthritis, left knee   Admission Diagnoses:  Past Medical History:  DiagnKellie Simmeringosis Date  . ADD (attention deficit disorder)   . Allergy   . Anemia   . Arthritis   . Chicken pox   . Colon polyps   . Depression   . Diabetes mellitus without complication (HCC)   . GERD (gastroesophageal reflux disease)   . Kidney stones   . Phlebitis   . UTI (urinary tract infection)   . UTI (urinary tract infection)    Discharge Diagnoses:   Principal Problem:   OA (osteoarthritis) of knee Active Problems:   S/P total knee arthroplasty  Estimated body mass index is 29.5 kg/m as calculated from the following:   Height as of this encounter: 5\' 5"  (1.651 m).   Weight as of this encounter: 80.4 kg.  Procedure:  Procedure(s) (LRB): TOTAL KNEE ARTHROPLASTY (Left)   Consults: None  HPI: Renee Young is a 74 y.o. year old female with end stage OA of her left knee with progressively worsening pain and dysfunction. She has constant pain, with activity and at rest and significant functional deficits with difficulties even with ADLs. She has had extensive non-op management including analgesics, injections of cortisone and viscosupplements, and home exercise program, but remains in significant pain with significant dysfunction. Radiographs show bone on bone arthritis medial and patellofemoral. She presents now for left Total Knee Arthroplasty.    Laboratory Data: Admission on 10/08/2019, Discharged on 10/09/2019  Component Date Value Ref Range Status  . ABO/RH(D) 10/08/2019    Final                   Value:A POS Performed at [facility], [address] [place], [place] [address]   . ABO/RH(D) 10/08/2019 A POS   Final  . Antibody Screen 10/08/2019 POS   Final  . Sample Expiration 10/08/2019    Final                    Value:10/11/2019,2359 Performed at [facility], [address] [place], [place] [address]   . Unit Number 10/08/2019 [id number]   Final  . Blood Component Type 10/08/2019 RED CELLS,LR   Final  . Unit division 10/08/2019 00   Final  . Status of Unit 10/08/2019 ALLOCATED   Final  . Donor AG Type 10/08/2019 NEGATIVE FOR c ANTIGEN   Final  . Transfusion Status 10/08/2019 OK TO TRANSFUSE   Final  . Crossmatch Result 10/08/2019 COMPATIBLE   Final  . Unit Number 10/08/2019 [id number]   Final  . Blood Component Type 10/08/2019 RED CELLS,LR   Final  . Unit division 10/08/2019 00   Final  . Status of Unit 10/08/2019 ALLOCATED   Final  . Donor AG Type 10/08/2019 NEGATIVE FOR c ANTIGEN   Final  . Transfusion Status 10/08/2019 OK TO TRANSFUSE   Final  . Crossmatch Result 10/08/2019 COMPATIBLE   Final  . Glucose-Capillary 10/08/2019 121* 70 - 99 mg/dL Final   Glucose reference range applies only to samples taken after fasting for at least 8 hours.  . Comment 1 10/08/2019 Notify RN   Final  . Comment 2 10/08/2019 Document in Chart   Final  . Blood Product Unit Number 10/08/2019 [id number]   Final  . PRODUCT CODE 10/08/2019 [id number]   Final  .  Unit Type and Rh 10/08/2019 6200   Final  . Blood Product Expiration Date 10/08/2019 [id number]   Final  . Blood Product Unit Number 10/08/2019 [id number]   Final  . PRODUCT CODE 10/08/2019 [id number]   Final  . Unit Type and Rh 10/08/2019 6200   Final  . Blood Product Expiration Date 10/08/2019 [id number]   Final  . Glucose-Capillary 10/08/2019 121* 70 - 99 mg/dL Final   Glucose reference range applies only to samples taken after fasting for at least 8 hours.  . Comment 1 10/08/2019 Notify RN   Final  . WBC 10/09/2019 10.1  4.0 - 10.5 K/uL Final  . RBC 10/09/2019 4.41  3.87 - 5.11 MIL/uL Final  . Hemoglobin 10/09/2019 11.3* 12.0 - 15.0 g/dL Final  . HCT 94/76/5465 35.6* 36 - 46 % Final  . MCV 10/09/2019 80.7   80.0 - 100.0 fL Final  . MCH 10/09/2019 25.6* 26.0 - 34.0 pg Final  . MCHC 10/09/2019 31.7  30.0 - 36.0 g/dL Final  . RDW 03/54/6568 14.7  11.5 - 15.5 % Final  . Platelets 10/09/2019 275  150 - 400 K/uL Final  . nRBC 10/09/2019 0.0  0.0 - 0.2 % Final   Performed at [facility], [address], [place], [place] [address]  . Sodium 10/09/2019 137  135 - 145 mmol/L Final  . Potassium 10/09/2019 4.1  3.5 - 5.1 mmol/L Final  . Chloride 10/09/2019 101  98 - 111 mmol/L Final  . CO2 10/09/2019 26  22 - 32 mmol/L Final  . Glucose, Bld 10/09/2019 196* 70 - 99 mg/dL Final   Glucose reference range applies only to samples taken after fasting for at least 8 hours.  . BUN 10/09/2019 16  8 - 23 mg/dL Final  . Creatinine, Ser 10/09/2019 0.88  0.44 - 1.00 mg/dL Final  . Calcium 70/02/7492 9.4  8.9 - 10.3 mg/dL Final  . GFR calc non Af Amer 10/09/2019 >60  >60 mL/min Final  . GFR calc Af Amer 10/09/2019 >60  >60 mL/min Final  . Anion gap 10/09/2019 10  5 - 15 Final   Performed at [facility], [address], [place], [place] [address]  . Glucose-Capillary 10/08/2019 157* 70 - 99 mg/dL Final   Glucose reference range applies only to samples taken after fasting for at least 8 hours.  . Glucose-Capillary 10/09/2019 148* 70 - 99 mg/dL Final   Glucose reference range applies only to samples taken after fasting for at least 8 hours.  . Glucose-Capillary 10/09/2019 145* 70 - 99 mg/dL Final   Glucose reference range applies only to samples taken after fasting for at least 8 hours.  Hospital Outpatient Visit on 10/04/2019  Component Date Value Ref Range Status  . SARS Coronavirus 2 10/04/2019 NEGATIVE  NEGATIVE Final   Comment: (NOTE) SARS-CoV-2 target nucleic acids are NOT DETECTED.  The SARS-CoV-2 RNA is generally detectable in upper and lower respiratory specimens during the acute phase of infection. Negative results do not preclude SARS-CoV-2 infection, do not rule  out co-infections with other pathogens, and should not be used as the sole basis for treatment or other patient management decisions. Negative results must be combined with clinical observations, patient history, and epidemiological information. The expected result is Negative.  Fact Sheet for Patients: HairSlick.no  Fact Sheet for Healthcare Providers: quierodirigir.com  This test is not yet approved or cleared by the [place] FDA and  has been authorized for detection and/or diagnosis of  SARS-CoV-2 by FDA under an Emergency Use Authorization (EUA). This EUA will remain  in effect (meaning this test can be used) for the duration of the COVID-19 declaration under Se                          ction 564(b)(1) of the Act, 21 U.S.C. section 360bbb-3(b)(1), unless the authorization is terminated or revoked sooner.  Performed at [facility], [address], [place], [place] [address]   Hospital Outpatient Visit on 09/26/2019  Component Date Value Ref Range Status  . MRSA, PCR 09/26/2019 NEGATIVE  NEGATIVE Final  . Staphylococcus aureus 09/26/2019 POSITIVE* NEGATIVE Final   Comment: (NOTE) The Xpert SA Assay (FDA approved for NASAL specimens in patients 64 years of age and older), is one component of a comprehensive surveillance program. It is not intended to diagnose infection nor to guide or monitor treatment. Performed at [facility], [address] [place], [place] [address]   . aPTT 09/26/2019 29  24 - 36 seconds Final   Performed at [facility], [address] [place], [place] [address]  . WBC 09/26/2019 6.4  4.0 - 10.5 K/uL Final  . RBC 09/26/2019 5.43* 3.87 - 5.11 MIL/uL Final  . Hemoglobin 09/26/2019 13.8  12.0 - 15.0 g/dL Final  . HCT 12/12/2534 44.1  36 - 46 % Final  . MCV 09/26/2019 81.2  80.0 - 100.0 fL Final  . MCH 09/26/2019 25.4* 26.0 - 34.0 pg Final  . MCHC  09/26/2019 31.3  30.0 - 36.0 g/dL Final  . RDW 64/40/3474 15.1  11.5 - 15.5 % Final  . Platelets 09/26/2019 319  150 - 400 K/uL Final  . nRBC 09/26/2019 0.0  0.0 - 0.2 % Final   Performed at [facility], [address] [place], [place] [address]  . Sodium 09/26/2019 139  135 - 145 mmol/L Final  . Potassium 09/26/2019 4.4  3.5 - 5.1 mmol/L Final  . Chloride 09/26/2019 104  98 - 111 mmol/L Final  . CO2 09/26/2019 27  22 - 32 mmol/L Final  . Glucose, Bld 09/26/2019 120* 70 - 99 mg/dL Final   Glucose reference range applies only to samples taken after fasting for at least 8 hours.  . BUN 09/26/2019 19  8 - 23 mg/dL Final  . Creatinine, Ser 09/26/2019 0.83  0.44 - 1.00 mg/dL Final  . Calcium 38/75/6433 9.6  8.9 - 10.3 mg/dL Final  . Total Protein 09/26/2019 7.8  6.5 - 8.1 g/dL Final  . Albumin 29/51/8841 4.0  3.5 - 5.0 g/dL Final  . AST 66/07/3014 14* 15 - 41 U/L Final  . ALT 09/26/2019 15  0 - 44 U/L Final  . Alkaline Phosphatase 09/26/2019 88  38 - 126 U/L Final  . Total Bilirubin 09/26/2019 0.5  0.3 - 1.2 mg/dL Final  . GFR calc non Af Amer 09/26/2019 >60  >60 mL/min Final  . GFR calc Af Amer 09/26/2019 >60  >60 mL/min Final  . Anion gap 09/26/2019 8  5 - 15 Final   Performed at [facility], [address], [place], [place] [address]  . Prothrombin Time 09/26/2019 12.8  11.4 - 15.2 seconds Final  . INR 09/26/2019 1.0  0.8 - 1.2 Final   Comment: (NOTE) INR goal varies based on device and disease states. Performed at [facility], [address], [place], [place] [address]   .  ABO/RH(D) 09/26/2019 A POS   Final  . Antibody Screen 09/26/2019 POS   Final  . Sample Expiration 09/26/2019 10/07/2019,2359   Final  . Extend sample reason 09/26/2019 NO TRANSFUSIONS OR PREGNANCY IN THE PAST 3 MONTHS   Final  . Antibody Identification 09/26/2019 ANTI c   Final  . PT AG Type 09/26/2019 NEGATIVE FOR c ANTIGEN   Final  . Unit Number  09/26/2019 [id number]   Final  . Blood Component Type 09/26/2019 RED CELLS,LR   Final  . Unit division 09/26/2019 00   Final  . Status of Unit 09/26/2019 REL FROM [facility]   Final  . Donor AG Type 09/26/2019 NEGATIVE FOR c ANTIGEN   Final  . Transfusion Status 09/26/2019 OK TO TRANSFUSE   Final  . Crossmatch Result 09/26/2019 COMPATIBLE   Final  . Unit Number 09/26/2019 [id number]   Final  . Blood Component Type 09/26/2019 RED CELLS,LR   Final  . Unit division 09/26/2019 00   Final  . Status of Unit 09/26/2019 REL FROM [facility]   Final  . Donor AG Type 09/26/2019 NEGATIVE FOR c ANTIGEN   Final  . Transfusion Status 09/26/2019 OK TO TRANSFUSE   Final  . Crossmatch Result 09/26/2019 COMPATIBLE   Final  . Glucose-Capillary 09/26/2019 114* 70 - 99 mg/dL Final   Glucose reference range applies only to samples taken after fasting for at least 8 hours.  . Blood Product Unit Number 09/26/2019 [id number]   Final  . PRODUCT CODE 09/26/2019 [id number]   Final  . Unit Type and Rh 09/26/2019 6200   Final  . Blood Product Expiration Date 09/26/2019 [id number]   Final  . Blood Product Unit Number 09/26/2019 [id number]   Final  . PRODUCT CODE 09/26/2019 [id number]   Final  . Unit Type and Rh 09/26/2019 6200   Final  . Blood Product Expiration Date 09/26/2019 [id number]   Final  Office Visit on 09/25/2019  Component Date Value Ref Range Status  . Hgb A1c MFr Bld 09/25/2019 7.3* <5.7 % of total Hgb Final   Comment: For someone without known diabetes, a hemoglobin A1c value of 6.5% or greater indicates that they may have  diabetes and this should be confirmed with a follow-up  test. . For someone with known diabetes, a value <7% indicates  that their diabetes is well controlled and a value  greater than or equal to 7% indicates suboptimal  control. A1c targets should be individualized based on  duration of diabetes, age, comorbid conditions, and  other considerations. . Currently, no  consensus exists regarding use of hemoglobin A1c for diagnosis of diabetes for children. .   . Mean Plasma Glucose 09/25/2019 163  (calc) Final  . eAG (mmol/L) 09/25/2019 9.0  (calc) Final  . Creatinine, Urine 09/25/2019 108  20 - 275 mg/dL Final  . Microalb, Ur 10/62/6948 8.3  mg/dL Final   Comment: Reference Range Not established   . Microalb Creat Ratio 09/25/2019 77* <30 mcg/mg creat Final   Comment: . The ADA defines abnormalities in albumin excretion as follows: Marland Kitchen Category         Result (mcg/mg creatinine) . Normal                    <30 Microalbuminuria         30-299  Clinical albuminuria   > OR = 300 . The ADA recommends that at least two of three specimens collected within a 3-6 month period be abnormal before considering  a patient to be within a diagnostic category.   . Iron 09/25/2019 77  45 - 160 mcg/dL Final  . TIBC 29/56/2130 380  250 - 450 mcg/dL (calc) Final  . %SAT 86/57/8469 20  16 - 45 % (calc) Final  . Ferritin 09/25/2019 18  16 - 288 ng/mL Final  . WBC 09/25/2019 5.4  3.8 - 10.8 Thousand/uL Final  . RBC 09/25/2019 5.42* 3.80 - 5.10 Million/uL Final  . Hemoglobin 09/25/2019 13.7  11.7 - 15.5 g/dL Final  . HCT 62/95/2841 43.8  35 - 45 % Final  . MCV 09/25/2019 80.8  80.0 - 100.0 fL Final  . MCH 09/25/2019 25.3* 27.0 - 33.0 pg Final  . MCHC 09/25/2019 31.3* 32.0 - 36.0 g/dL Final  . RDW 32/44/0102 14.3  11.0 - 15.0 % Final  . Platelets 09/25/2019 346  140 - 400 Thousand/uL Final  . MPV 09/25/2019 8.8  7.5 - 12.5 fL Final  . Neutro Abs 09/25/2019 3,742  1,500 - 7,800 cells/uL Final  . Lymphs Abs 09/25/2019 929  850 - 3,900 cells/uL Final  . Absolute Monocytes 09/25/2019 481  200 - 950 cells/uL Final  . Eosinophils Absolute 09/25/2019 211  15 - 500 cells/uL Final  . Basophils Absolute 09/25/2019 38  0 - 200 cells/uL Final  . Neutrophils Relative % 09/25/2019 69.3  % Final  . Total Lymphocyte 09/25/2019 17.2  % Final  . Monocytes Relative 09/25/2019  8.9  % Final  . Eosinophils Relative 09/25/2019 3.9  % Final  . Basophils Relative 09/25/2019 0.7  % Final     X-Rays:No results found.  EKG: Orders placed or performed during the hospital encounter of 09/26/19  . EKG 12 lead per protocol  . EKG 12 lead per protocol     Hospital Course: THY GULLIKSON is a 74 y.o. who was admitted to [facility]. They were brought to the operating room on 10/08/2019 and underwent Procedure(s): TOTAL KNEE ARTHROPLASTY.  Patient tolerated the procedure well and was later transferred to the recovery room and then to the orthopaedic floor for postoperative care. They were given PO and IV analgesics for pain control following their surgery. They were given 24 hours of postoperative antibiotics of  Anti-infectives (From admission, onward)   Start     Dose/Rate Route Frequency Ordered Stop   10/08/19 1330  ceFAZolin (ANCEF) IVPB 2g/100 mL premix        2 g 200 mL/hr over 30 Minutes Intravenous Every 6 hours 10/08/19 0828 10/09/19 0146   10/08/19 0600  ceFAZolin (ANCEF) IVPB 2g/100 mL premix        2 g 200 mL/hr over 30 Minutes Intravenous On call to O.R. 10/08/19 7253 10/08/19 0751   10/08/19 0542  ceFAZolin (ANCEF) 2-4 GM/100ML-% IVPB       Note to Pharmacy: Vevelyn Royals  : cabinet override      10/08/19 0542 10/08/19 0730     and started on DVT prophylaxis in the form of Aspirin.   PT and OT were ordered for total joint protocol. Discharge planning consulted to help with postop disposition and equipment needs.  Patient had a good night on the evening of surgery. They started to get up OOB with therapy on POD #0. Pt was seen during rounds and was ready to go home pending progress with therapy. She worked with therapy on POD #1 and was meeting her goals. Pt was discharged to home later that day in stable condition.  Diet: Diabetic diet  Activity: WBAT Follow-up: in 2 weeks Disposition: Home with outpatient physical therapy Discharged Condition:  stable   Discharge Instructions    Call MD / Call 911   Complete by: As directed    If you experience chest pain or shortness of breath, CALL 911 and be transported to the hospital emergency room.  If you develope a fever above 101 F, pus (white drainage) or increased drainage or redness at the wound, or calf pain, call your surgeon's office.   Change dressing   Complete by: As directed    You may remove the bulky bandage (ACE wrap and gauze) two days after surgery. You will have an adhesive waterproof bandage underneath. Leave this in place until your first follow-up appointment.   Constipation Prevention   Complete by: As directed    Drink plenty of fluids.  Prune juice may be helpful.  You may use a stool softener, such as Colace (over the counter) 100 mg twice a day.  Use MiraLax (over the counter) for constipation as needed.   Diet - low sodium heart healthy   Complete by: As directed    Do not put a pillow under the knee. Place it under the heel.   Complete by: As directed    Driving restrictions   Complete by: As directed    No driving for two weeks   TED hose   Complete by: As directed    Use stockings (TED hose) for three weeks on both leg(s).  You may remove them at night for sleeping.   Weight bearing as tolerated   Complete by: As directed      Allergies as of 10/09/2019      Reactions   Nitrofurantoin Hives, Swelling   Penicillins Swelling   Tolerated Cephalosporin Date: 10/09/19.      Medication List    TAKE these medications   amphetamine-dextroamphetamine 20 MG tablet Commonly known as: ADDERALL Take 20 mg by mouth daily.   aspirin EC 325 MG tablet Take 1 tablet (325 mg total) by mouth 2 (two) times daily for 21 days. Then take one 81 mg aspirin once a day for three weeks. Then discontinue aspirin.   atorvastatin 20 MG tablet Commonly known as: LIPITOR Take 1 tablet (20 mg total) by mouth daily.   cholecalciferol 1000 units tablet Commonly known as:  VITAMIN D Take 1,000 Units by mouth daily.   FLUoxetine 20 MG capsule Commonly known as: PROZAC Take 40 mg by mouth at bedtime.   gabapentin 300 MG capsule Commonly known as: Neurontin Take a 300 mg capsule three times a day for two weeks following surgery.Then take a 300 mg capsule two times a day for two weeks. Then take a 300 mg capsule once a day for two weeks. Then discontinue.   metFORMIN 500 MG tablet Commonly known as: Glucophage Take 1 tablet (500 mg total) by mouth 2 (two) times daily with a meal.   methocarbamol 500 MG tablet Commonly known as: Robaxin Take 1 tablet (500 mg total) by mouth every 6 (six) hours as needed for muscle spasms.   oxyCODONE 5 MG immediate release tablet Commonly known as: Roxicodone Take 1-2 tablets (5-10 mg total) by mouth every 6 (six) hours as needed for moderate pain or severe pain.            Discharge Care Instructions  (From admission, onward)         Start     Ordered   10/09/19 0000  Weight bearing  as tolerated        10/09/19 0711   10/09/19 0000  Change dressing       Comments: You may remove the bulky bandage (ACE wrap and gauze) two days after surgery. You will have an adhesive waterproof bandage underneath. Leave this in place until your first follow-up appointment.   10/09/19 4010          Follow-up Information    Ollen Gross, MD. Go on 10/23/2019.   Specialty: Orthopedic Surgery Why: You are scheduled for a post-operative appointment on 10-23-19 at 3:00 pm.  Contact information: [address] [place] [place] [address] [phone]               Signed: Arther Abbott, PA-C Orthopedic Surgery 10/10/2019, 7:36 AM

## 2019-10-12 LAB — TYPE AND SCREEN
ABO/RH(D): A POS
Antibody Screen: POSITIVE
Donor AG Type: NEGATIVE
Donor AG Type: NEGATIVE
Unit division: 0
Unit division: 0

## 2019-10-12 LAB — BPAM RBC
Blood Product Expiration Date: 202109162359
Blood Product Expiration Date: 202109182359
Unit Type and Rh: 6200
Unit Type and Rh: 6200

## 2019-10-17 DIAGNOSIS — R262 Difficulty in walking, not elsewhere classified: Secondary | ICD-10-CM | POA: Diagnosis not present

## 2019-10-17 DIAGNOSIS — Z96652 Presence of left artificial knee joint: Secondary | ICD-10-CM | POA: Diagnosis not present

## 2019-10-17 DIAGNOSIS — M25662 Stiffness of left knee, not elsewhere classified: Secondary | ICD-10-CM | POA: Diagnosis not present

## 2019-10-17 DIAGNOSIS — M6281 Muscle weakness (generalized): Secondary | ICD-10-CM | POA: Diagnosis not present

## 2019-10-18 DIAGNOSIS — M25662 Stiffness of left knee, not elsewhere classified: Secondary | ICD-10-CM | POA: Diagnosis not present

## 2019-10-18 DIAGNOSIS — R262 Difficulty in walking, not elsewhere classified: Secondary | ICD-10-CM | POA: Diagnosis not present

## 2019-10-18 DIAGNOSIS — M6281 Muscle weakness (generalized): Secondary | ICD-10-CM | POA: Diagnosis not present

## 2019-10-18 DIAGNOSIS — Z96652 Presence of left artificial knee joint: Secondary | ICD-10-CM | POA: Diagnosis not present

## 2019-10-24 DIAGNOSIS — M6281 Muscle weakness (generalized): Secondary | ICD-10-CM | POA: Diagnosis not present

## 2019-10-24 DIAGNOSIS — R262 Difficulty in walking, not elsewhere classified: Secondary | ICD-10-CM | POA: Diagnosis not present

## 2019-10-24 DIAGNOSIS — M25662 Stiffness of left knee, not elsewhere classified: Secondary | ICD-10-CM | POA: Diagnosis not present

## 2019-10-24 DIAGNOSIS — Z96652 Presence of left artificial knee joint: Secondary | ICD-10-CM | POA: Diagnosis not present

## 2019-10-26 ENCOUNTER — Telehealth: Payer: Self-pay

## 2019-10-26 DIAGNOSIS — M25662 Stiffness of left knee, not elsewhere classified: Secondary | ICD-10-CM | POA: Diagnosis not present

## 2019-10-26 DIAGNOSIS — R262 Difficulty in walking, not elsewhere classified: Secondary | ICD-10-CM | POA: Diagnosis not present

## 2019-10-26 DIAGNOSIS — M6281 Muscle weakness (generalized): Secondary | ICD-10-CM | POA: Diagnosis not present

## 2019-10-26 DIAGNOSIS — Z96652 Presence of left artificial knee joint: Secondary | ICD-10-CM | POA: Diagnosis not present

## 2019-10-26 MED ORDER — BLOOD GLUCOSE MONITOR KIT: PACK | 0 refills | Status: DC

## 2019-10-26 NOTE — Telephone Encounter (Signed)
Copied from CRM [id number]. Topic: General - Inquiry >> Oct 26, 2019 11:08 AM Adrian Prince D wrote: Reason for CRM: Patient would like a Glucometer to check her blood sugar levels. She also needs the test strips and lancets. She would like for you to call it in to [facility] on [address]. Please advise

## 2019-10-29 DIAGNOSIS — M25662 Stiffness of left knee, not elsewhere classified: Secondary | ICD-10-CM | POA: Diagnosis not present

## 2019-10-29 DIAGNOSIS — R262 Difficulty in walking, not elsewhere classified: Secondary | ICD-10-CM | POA: Diagnosis not present

## 2019-10-29 DIAGNOSIS — M6281 Muscle weakness (generalized): Secondary | ICD-10-CM | POA: Diagnosis not present

## 2019-10-29 DIAGNOSIS — Z96652 Presence of left artificial knee joint: Secondary | ICD-10-CM | POA: Diagnosis not present

## 2019-11-01 DIAGNOSIS — M6281 Muscle weakness (generalized): Secondary | ICD-10-CM | POA: Diagnosis not present

## 2019-11-01 DIAGNOSIS — R262 Difficulty in walking, not elsewhere classified: Secondary | ICD-10-CM | POA: Diagnosis not present

## 2019-11-01 DIAGNOSIS — M25662 Stiffness of left knee, not elsewhere classified: Secondary | ICD-10-CM | POA: Diagnosis not present

## 2019-11-01 DIAGNOSIS — Z96652 Presence of left artificial knee joint: Secondary | ICD-10-CM | POA: Diagnosis not present

## 2019-11-12 DIAGNOSIS — Z96652 Presence of left artificial knee joint: Secondary | ICD-10-CM | POA: Diagnosis not present

## 2019-11-12 DIAGNOSIS — M25662 Stiffness of left knee, not elsewhere classified: Secondary | ICD-10-CM | POA: Diagnosis not present

## 2019-11-12 DIAGNOSIS — M6281 Muscle weakness (generalized): Secondary | ICD-10-CM | POA: Diagnosis not present

## 2019-11-12 DIAGNOSIS — R262 Difficulty in walking, not elsewhere classified: Secondary | ICD-10-CM | POA: Diagnosis not present

## 2019-11-13 DIAGNOSIS — Z96652 Presence of left artificial knee joint: Secondary | ICD-10-CM | POA: Diagnosis not present

## 2019-11-13 DIAGNOSIS — Z471 Aftercare following joint replacement surgery: Secondary | ICD-10-CM | POA: Diagnosis not present

## 2019-11-14 DIAGNOSIS — M25662 Stiffness of left knee, not elsewhere classified: Secondary | ICD-10-CM | POA: Diagnosis not present

## 2019-11-14 DIAGNOSIS — Z96652 Presence of left artificial knee joint: Secondary | ICD-10-CM | POA: Diagnosis not present

## 2019-11-14 DIAGNOSIS — M6281 Muscle weakness (generalized): Secondary | ICD-10-CM | POA: Diagnosis not present

## 2019-11-14 DIAGNOSIS — R262 Difficulty in walking, not elsewhere classified: Secondary | ICD-10-CM | POA: Diagnosis not present

## 2019-11-20 DIAGNOSIS — M25662 Stiffness of left knee, not elsewhere classified: Secondary | ICD-10-CM | POA: Diagnosis not present

## 2019-11-20 DIAGNOSIS — Z96652 Presence of left artificial knee joint: Secondary | ICD-10-CM | POA: Diagnosis not present

## 2019-11-20 DIAGNOSIS — M6281 Muscle weakness (generalized): Secondary | ICD-10-CM | POA: Diagnosis not present

## 2019-11-20 DIAGNOSIS — R262 Difficulty in walking, not elsewhere classified: Secondary | ICD-10-CM | POA: Diagnosis not present

## 2019-11-22 DIAGNOSIS — M6281 Muscle weakness (generalized): Secondary | ICD-10-CM | POA: Diagnosis not present

## 2019-11-22 DIAGNOSIS — M25662 Stiffness of left knee, not elsewhere classified: Secondary | ICD-10-CM | POA: Diagnosis not present

## 2019-11-22 DIAGNOSIS — Z96652 Presence of left artificial knee joint: Secondary | ICD-10-CM | POA: Diagnosis not present

## 2019-11-22 DIAGNOSIS — R262 Difficulty in walking, not elsewhere classified: Secondary | ICD-10-CM | POA: Diagnosis not present

## 2019-11-28 ENCOUNTER — Telehealth: Payer: Self-pay

## 2019-11-28 ENCOUNTER — Ambulatory Visit: Payer: Medicare PPO | Admitting: Internal Medicine

## 2019-11-28 ENCOUNTER — Other Ambulatory Visit: Payer: Self-pay

## 2019-11-28 ENCOUNTER — Encounter: Payer: Self-pay | Admitting: Internal Medicine

## 2019-11-28 VITALS — BP 122/60 | HR 103 | Temp 97.8°F | Resp 16 | Ht 65.0 in | Wt 173.2 lb

## 2019-11-28 DIAGNOSIS — M25662 Stiffness of left knee, not elsewhere classified: Secondary | ICD-10-CM | POA: Diagnosis not present

## 2019-11-28 DIAGNOSIS — R195 Other fecal abnormalities: Secondary | ICD-10-CM | POA: Diagnosis not present

## 2019-11-28 DIAGNOSIS — E782 Mixed hyperlipidemia: Secondary | ICD-10-CM

## 2019-11-28 DIAGNOSIS — Z96652 Presence of left artificial knee joint: Secondary | ICD-10-CM | POA: Diagnosis not present

## 2019-11-28 DIAGNOSIS — E1165 Type 2 diabetes mellitus with hyperglycemia: Secondary | ICD-10-CM

## 2019-11-28 DIAGNOSIS — R262 Difficulty in walking, not elsewhere classified: Secondary | ICD-10-CM | POA: Diagnosis not present

## 2019-11-28 DIAGNOSIS — R140 Abdominal distension (gaseous): Secondary | ICD-10-CM | POA: Diagnosis not present

## 2019-11-28 DIAGNOSIS — R110 Nausea: Secondary | ICD-10-CM | POA: Diagnosis not present

## 2019-11-28 DIAGNOSIS — M6281 Muscle weakness (generalized): Secondary | ICD-10-CM | POA: Diagnosis not present

## 2019-11-28 MED ORDER — SITAGLIPTIN PHOSPHATE 50 MG PO TABS: 50.0000 mg | ORAL_TABLET | Freq: Every day | ORAL | 1 refills | Status: DC

## 2019-11-28 NOTE — Progress Notes (Signed)
Patient ID: Renee Young, female    DOB: 1945-10-23, 74 y.o.   MRN: [id number]  PCP: Towanda Malkin, MD  Chief Complaint  Patient presents with  . Diarrhea  . Nausea    Subjective:   Renee Young is a 74 y.o. female, presents to clinic with CC of the following:  Chief Complaint  Patient presents with  . Diarrhea  . Nausea    HPI:  Patient is a 74 year old female Last visit with me was in August 2021 She sent this message earlier today  Patient has been having stomach related problems like nausea and diarrhea since she has been taking metformin since August. Patient feels that she is having a lot of issues while on this medication and would like to know if there is an alternative medication that can be called in without the side effects she has been having. Patient would like a callback from nurse . Patient will be unavailable from 1130-1230 today. Please advise Was scheduled for a follow-up visit today to address  Communication attempted after her labs were obtained in April was as follows:             Your hemoglobin A1c was higher at 7.1, with this reading is consistent with diabetes. I do think we should discuss starting a medication for this. (Metformin) Your lipid panel remains very abnormal, and I do think a medication (a statin) would be very helpful to start. Your complete blood count showed that you are not anemic, although the size of the red cells and the hemoglobin content of the red cells were slightly low, and this is not a new finding comparing this to prior complete blood counts. This may be related to an iron deficiency concern, and I would recommend checking iron studies at this point. The complete metabolic panel and the thyroid screen (TSH) were normal. I would like to have a follow-up visit to discuss the above, including potentially adding a couple new medications as noted above and will ask Melissa to help get a follow-up visit set up to  further discuss.  Unfortunately, a follow-up visit was not able to be scheduled and she followed up in August with Metformin and a statin initiated.  Of note, she had a total knee replacement on 10/08/2019   Symptoms started about 6 weeks ago, mostly diarrhea and bloating and nausea at times.  No vomiting.  Thought may be due to pain meds with ortho surgery. Was more fatigued also. Stopped pain meds except tylenol and still having sx's.  She denies fevers, no severe abdominal pains, no dark or black stools, no bleeding per rectum, no urinary symptoms of concern and was reading about the Metformin product and the side effects that can occur, and these were noted.  She has been taking it with food. She did not take it this morning.  She notes she is having a good day today. She was unable to get the meter prescribed as of yet, and asked Crystal to help and she called to [facility] to check on the status.  diabetes Current medication regimen -Metformin  500 mg twice daily. She is concerned that she is having some side effects from this medication including nausea and diarrhea. She is not checking sugars at home yet, still awaiting the meter to do so.  She very much wants to get the meter to help follow. Lab Results  Component Value Date   HGBA1C 7.3 (H) 09/25/2019  HGBA1C 7.1 (H) 05/30/2019   HGBA1C 6.7 (H) 12/24/2016   Lab Results  Component Value Date   MICROALBUR 8.3 09/25/2019   LDLCALC 195 (H) 05/30/2019   CREATININE 0.88 10/09/2019    Last eye exam- July 2021 Not on an ACE  Hyperlipidemia Medication regimen -Lipitor 20 mg daily was prescribed for her to start last visit in August, and she noted she wanted to wait until after the surgery to start this. I noted can start even before the surgery, and the importance of this medicine and helping prevent future cardiac events, and medicine was ordered for her to be at her pharmacy.  She had noted previously she is often averse to medicines and  prefers other ways to try to manage if possible.  Lab Results  Component Value Date   CHOL 278 (H) 05/30/2019   HDL 38 (L) 05/30/2019   LDLCALC 195 (H) 05/30/2019   LDLDIRECT 222.0 03/07/2009   TRIG 252 (H) 05/30/2019   CHOLHDL 7.3 (H) 05/30/2019   Did not start Lipitor and and noted today that she will presently.  I spent time strongly encouraging this medicine, especially being diabetic, and the data supporting its use, and she still noted she will not start that medicine presently  Status post left knee replacement -has been doing well since the surgery   Patient Active Problem List   Diagnosis Date Noted  . S/P total knee arthroplasty 10/08/2019  . Tachycardia 09/25/2019  . Hypochromic erythrocytes 09/24/2019  . Microcytic erythrocytes 09/24/2019  . Prediabetes 05/30/2019  . Family history of thyroid disease 05/30/2019  . Primary osteoarthritis of both knees 05/30/2019  . Bilateral temporomandibular joint pain 05/30/2019  . History of colonic polyps 12/17/2016  . Varicose veins of bilateral lower extremities with other complications 37/05/8887  . Pain in limb 07/03/2012  . OA (osteoarthritis) of knee 03/21/2009  . Type 2 diabetes mellitus with hyperglycemia, without long-term current use of insulin ([place]) 07/19/2007  . Mixed hyperlipidemia 07/19/2007  . DEPRESSION 07/19/2007  . ATTENTION DEFICIT DISORDER 07/19/2007      Current Outpatient Medications:  .  amphetamine-dextroamphetamine (ADDERALL) 20 MG tablet, Take 20 mg by mouth daily. , Disp: , Rfl: 0 .  blood glucose meter kit and supplies KIT, Dispense based on patient and insurance preference. Use up to four times daily as directed. (FOR ICD-9 250.00, 250.01)., Disp: 1 each, Rfl: 0 .  cholecalciferol (VITAMIN D) 1000 units tablet, Take 1,000 Units by mouth daily., Disp: , Rfl:  .  FLUoxetine (PROZAC) 20 MG capsule, Take 40 mg by mouth at bedtime. , Disp: , Rfl:  .  metFORMIN (GLUCOPHAGE) 500 MG tablet, Take 1  tablet (500 mg total) by mouth 2 (two) times daily with a meal., Disp: 180 tablet, Rfl: 3 .  atorvastatin (LIPITOR) 20 MG tablet, Take 1 tablet (20 mg total) by mouth daily. (Patient not taking: Reported on 11/28/2019), Disp: 90 tablet, Rfl: 3 .  gabapentin (NEURONTIN) 300 MG capsule, Take a 300 mg capsule three times a day for two weeks following surgery.Then take a 300 mg capsule two times a day for two weeks. Then take a 300 mg capsule once a day for two weeks. Then discontinue. (Patient not taking: Reported on 11/28/2019), Disp: 84 capsule, Rfl: 0 .  methocarbamol (ROBAXIN) 500 MG tablet, Take 1 tablet (500 mg total) by mouth every 6 (six) hours as needed for muscle spasms. (Patient not taking: Reported on 11/28/2019), Disp: 40 tablet, Rfl: 0 .  oxyCODONE (ROXICODONE) 5  MG immediate release tablet, Take 1-2 tablets (5-10 mg total) by mouth every 6 (six) hours as needed for moderate pain or severe pain. (Patient not taking: Reported on 11/28/2019), Disp: 56 tablet, Rfl: 0 .  sitaGLIPtin (JANUVIA) 50 MG tablet, Take 1 tablet (50 mg total) by mouth daily., Disp: 90 tablet, Rfl: 1   Allergies  Allergen Reactions  . Nitrofurantoin Hives and Swelling  . Penicillins Swelling    Tolerated Cephalosporin Date: 10/09/19.       Past Surgical History:  Procedure Laterality Date  . CESAREAN SECTION    . TOTAL KNEE ARTHROPLASTY Left 10/08/2019   Procedure: TOTAL KNEE ARTHROPLASTY;  Surgeon: Gaynelle Arabian, MD;  Location: [facility];  Service: Orthopedics;  Laterality: Left;  . TUBAL LIGATION    . URETEROCELE INCISION    . VASCULAR SURGERY     varicose vein laser surgery     Family History  Problem Relation Age of Onset  . Cancer Mother   . Kidney disease Mother   . Diabetes Father   . Heart disease Father   . Hyperlipidemia Father   . Hypertension Father   . Stroke Father   . Arthritis Sister   . Obesity Brother   . Arthritis Brother   . Heart disease Maternal Grandmother   . Stroke  Maternal Grandmother   . Hypertension Maternal Grandmother   . Heart disease Maternal Grandfather   . Hyperlipidemia Maternal Grandfather   . Stroke Paternal Grandmother   . Heart disease Paternal Grandmother   . Heart disease Paternal Grandfather   . Heart attack Paternal Grandfather   . Arthritis Daughter      Social History   Tobacco Use  . Smoking status: Never Smoker  . Smokeless tobacco: Never Used  Substance Use Topics  . Alcohol use: Yes    Alcohol/week: 2.0 standard drinks    Types: 2 Glasses of wine per week    Comment: occas.    With staff assistance, above reviewed with the patient today.  ROS: As per HPI, otherwise no specific complaints on a limited and focused system review   No results found for this or any previous visit (from the past 72 hour(s)).   PHQ2/9: Depression screen [facility] 2/9 11/28/2019 09/25/2019 05/30/2019 04/20/2017 01/11/2017  Decreased Interest 1 0 0 0 0  Down, Depressed, Hopeless 0 0 0 0 0  PHQ - 2 Score 1 0 0 0 0  Altered sleeping 2 0 0 - -  Tired, decreased energy 2 0 0 - -  Change in appetite 1 0 0 - -  Feeling bad or failure about yourself  0 0 0 - -  Trouble concentrating 0 0 0 - -  Moving slowly or fidgety/restless 0 0 0 - -  Suicidal thoughts 0 0 0 - -  PHQ-9 Score 6 0 0 - -  Difficult doing work/chores Somewhat difficult Not difficult at all Not difficult at all - -   PHQ-2/9 Result reviewed  Fall Risk: Fall Risk  11/28/2019 09/25/2019 05/30/2019 04/20/2017 01/11/2017  Falls in the past year? '1 1 1 ' Yes Yes  Comment - - - fell on Sunday, 04/17/17 -  Number falls in past yr: 1 1 0 - 1  Comment - - - - -  Injury with Fall? 0 0 0 - No  Comment - - - - bruises      Objective:   Vitals:   11/28/19 1417  BP: 122/60  Pulse: (!) 103  Resp: 16  Temp:  97.8 F (36.6 C)  TempSrc: Oral  SpO2: 96%  Weight: 173 lb 3.2 oz (78.6 kg)  Height: '5\' 5"'  (1.651 m)    Body mass index is 28.82 kg/m.  Physical Exam   NAD, masked,  pleasant HEENT - [place]/AT,positive glasses,sclera anicteric, PERRL, EOMI, conj - non-inj'ed, pharynx clear Neck - supple, no adenopathy,  Car -borderline tachycardic and regular without m/g/r, heart rate approximately 96 on my exam Pulm- RR and effort normal at rest, CTA without wheeze or rales Abd - soft,obese, not markedly tender with palpation diffusely, no rebound or guarding, bowel sounds positive, no obvious masses, nondistended Back - no CVA tenderness Ext - no LE edema,increased varicosities noted, most concentrated in the left ankle region, much milder on the right, scar present on left knee from prior surgery Neuro/psychiatric - affect was not flat, appropriate with conversation Alert with normal speech  Results for orders placed or performed during the hospital encounter of 10/08/19  Glucose, capillary  Result Value Ref Range   Glucose-Capillary 121 (H) 70 - 99 mg/dL   Comment 1 Notify RN    Comment 2 Document in Chart   Glucose, capillary  Result Value Ref Range   Glucose-Capillary 121 (H) 70 - 99 mg/dL   Comment 1 Notify RN   CBC  Result Value Ref Range   WBC 10.1 4.0 - 10.5 K/uL   RBC 4.41 3.87 - 5.11 MIL/uL   Hemoglobin 11.3 (L) 12.0 - 15.0 g/dL   HCT 35.6 (L) 36 - 46 %   MCV 80.7 80.0 - 100.0 fL   MCH 25.6 (L) 26.0 - 34.0 pg   MCHC 31.7 30.0 - 36.0 g/dL   RDW 14.7 11.5 - 15.5 %   Platelets 275 150 - 400 K/uL   nRBC 0.0 0.0 - 0.2 %  Basic metabolic panel  Result Value Ref Range   Sodium 137 135 - 145 mmol/L   Potassium 4.1 3.5 - 5.1 mmol/L   Chloride 101 98 - 111 mmol/L   CO2 26 22 - 32 mmol/L   Glucose, Bld 196 (H) 70 - 99 mg/dL   BUN 16 8 - 23 mg/dL   Creatinine, Ser 0.88 0.44 - 1.00 mg/dL   Calcium 9.4 8.9 - 10.3 mg/dL   GFR calc non Af Amer >60 >60 mL/min   GFR calc Af Amer >60 >60 mL/min   Anion gap 10 5 - 15  Glucose, capillary  Result Value Ref Range   Glucose-Capillary 157 (H) 70 - 99 mg/dL  Glucose, capillary  Result Value Ref  Range   Glucose-Capillary 148 (H) 70 - 99 mg/dL  Glucose, capillary  Result Value Ref Range   Glucose-Capillary 145 (H) 70 - 99 mg/dL  ABO/Rh  Result Value Ref Range   ABO/RH(D)      A POS Performed at [facility], [place] [address], [place], [place] [address]   Type and screen [facility]  Result Value Ref Range   ABO/RH(D) A POS    Antibody Screen POS    Sample Expiration 10/11/2019,2359    Unit Number [id number]    Blood Component Type RED CELLS,LR    Unit division 00    Status of Unit REL FROM [facility]    Donor AG Type NEGATIVE FOR c ANTIGEN    Transfusion Status OK TO TRANSFUSE    Crossmatch Result COMPATIBLE    Unit Number [id number]    Blood Component Type RED CELLS,LR    Unit division 00  Status of Unit REL FROM [facility]    Donor AG Type NEGATIVE FOR c ANTIGEN    Transfusion Status OK TO TRANSFUSE    Crossmatch Result COMPATIBLE   BPAM RBC  Result Value Ref Range   Blood Product Unit Number [id number]    PRODUCT CODE [id number]    Unit Type and Rh 6073    Blood Product Expiration Date 710626948546    Blood Product Unit Number [id number]    PRODUCT CODE [id number]    Unit Type and Rh 7893    Blood Product Expiration Date 810175102585        Assessment & Plan:   1. Type 2 diabetes mellitus with hyperglycemia, without long-term current use of insulin ([place]) Do feel the symptoms of concern noted in the past weeks is possibly due to the Metformin product, and she is taking it with food.  It is the lower 500 mg dose. Discussed potentially changing to the Metformin XR, as that is often better tolerated, although she would prefer not to and  just change to a different medicine. I did discuss the many other medicines that are options presently, with the sulfonylureas often the most cost friendly, although can cause concerns for potential hypoglycemia, and do feel there are better medicines to start presently Noted the newer ones  often seen on TV, the SGLT2 and GLP-1's, and their benefits and patients with heart disease or kidney disease.  They can often be cost prohibitive. There are other classes and options noted, and felt trying a product like sitagliptin (Januvia) was reasonable presently.  She agreed and prescribed. She will follow her sugars at home with a meter, and recommended she take the sugars fasting in the morning to help follow, and also can take if having symptoms to assess.  - sitaGLIPtin (JANUVIA) 50 MG tablet; Take 1 tablet (50 mg total) by mouth daily.  Dispense: 90 tablet; Refill: 1  2. Nausea 3. Abdominal bloating 4. Loose bowel movements As above, possibly due to the Metformin product as a source. If symptoms do not improve/resolve being off of the Metformin, she is to follow-up  5. Mixed hyperlipidemia Strongly encouraged her to be on a statin, although she was quite adamant and not wanting to start that medicine today.  We will keep her planned follow-up in December, follow-up sooner as needed.     Towanda Malkin, MD 11/28/19 2:56 PM

## 2019-11-28 NOTE — Telephone Encounter (Signed)
Please schedule a visit to address.

## 2019-11-28 NOTE — Patient Instructions (Signed)
Stop the Glucophage product, and start the sitagliptin (Januvia) prescribed today, once daily  Did also prescribed a meter for you to be able to check your blood sugars, and would check in the morning fasting to help follow.  Also can check if you are having any symptoms of concern.  As we discussed, I would strongly encourage you to start the atorvastatin product, although I know you do not want to do so presently.   Type 2 Diabetes Mellitus, Self Care, Adult When you have type 2 diabetes (type 2 diabetes mellitus), you must make sure your blood sugar (glucose) stays in a healthy range. You can do this with:  Nutrition.  Exercise.  Lifestyle changes.  Medicines or insulin, if needed.  Support from your doctors and others. How to stay aware of blood sugar   Check your blood sugar level every day, as often as told.  Have your A1c (hemoglobin A1c) level checked two or more times a year. Have it checked more often if your doctor tells you to. Your doctor will set personal treatment goals for you. Generally, you should have these blood sugar levels:  Before meals (preprandial): 80-130 mg/dL (4.4-7.2 mmol/L).  After meals (postprandial): below 180 mg/dL (10 mmol/L).  A1c level: less than 7%. How to manage high and low blood sugar Signs of high blood sugar High blood sugar is called hyperglycemia. Know the signs of high blood sugar. Signs may include:  Feeling: ? Thirsty. ? Hungry. ? Very tired.  Needing to pee (urinate) more than usual.  Blurry vision. Signs of low blood sugar Low blood sugar is called hypoglycemia. This is when blood sugar is at or below 70 mg/dL (3.9 mmol/L). Signs may include:  Feeling: ? Hungry. ? Worried or nervous (anxious). ? Sweaty and clammy. ? Confused. ? Dizzy. ? Sleepy. ? Sick to your stomach (nauseous).  Having: ? A fast heartbeat. ? A headache. ? A change in your vision. ? Jerky movements that you cannot control  (seizure). ? Tingling or no feeling (numbness) around your mouth, lips, or tongue.  Having trouble with: ? Moving (coordination). ? Sleeping. ? Passing out (fainting). ? Getting upset easily (irritability). Treating low blood sugar To treat low blood sugar, eat or drink something sugary right away. If you can think clearly and swallow safely, follow the 15:15 rule:  Take 15 grams of a fast-acting carb (carbohydrate). Talk with your doctor about how much you should take.  Some fast-acting carbs are: ? Sugar tablets (glucose pills). Take 3-4 pills. ? 6-8 pieces of hard candy. ? 4-6 oz (120-150 mL) of fruit juice. ? 4-6 oz (120-150 mL) of regular (not diet) soda. ? 1 Tbsp (15 mL) honey or sugar.  Check your blood sugar 15 minutes after you take the carb.  If your blood sugar is still at or below 70 mg/dL (3.9 mmol/L), take 15 grams of a carb again.  If your blood sugar does not go above 70 mg/dL (3.9 mmol/L) after 3 tries, get help right away.  After your blood sugar goes back to normal, eat a meal or a snack within 1 hour. Treating very low blood sugar If your blood sugar is at or below 54 mg/dL (3 mmol/L), you have very low blood sugar (severe hypoglycemia). This is an emergency. Do not wait to see if the symptoms will go away. Get medical help right away. Call your local emergency services (911 in the [place] If you have very low blood sugar and you cannot  eat or drink, you may need a glucagon shot (injection). A family member or friend should learn how to check your blood sugar and how to give you a glucagon shot. Ask your doctor if you need to have a glucagon shot kit at home. Follow these instructions at home: Medicine  Take insulin and diabetes medicines as told.  If your doctor says you should take more or less insulin and medicines, do this exactly as told.  Do not run out of insulin or medicines. Having diabetes can raise your risk for other long-term conditions. These  include heart disease and kidney disease. Your doctor may prescribe medicines to help you not have these problems. Food   Make healthy food choices. These include: ? Chicken, fish, egg whites, and beans. ? Oats, whole wheat, bulgur, brown rice, quinoa, and millet. ? Fresh fruits and vegetables. ? Low-fat dairy products. ? Nuts, avocado, olive oil, and canola oil.  Meet with a food specialist (dietitian). He or she can help you make an eating plan that is right for you.  Follow instructions from your doctor about what you cannot eat or drink.  Drink enough fluid to keep your pee (urine) pale yellow.  Keep track of carbs that you eat. Do this by reading food labels and learning food serving sizes.  Follow your sick day plan when you cannot eat or drink normally. Make this plan with your doctor so it is ready to use. Activity  Exercise 3 or more times a week.  Do not go more than 2 days without exercising.  Talk with your doctor before you start a new exercise. Your doctor may need to tell you to change: ? How much insulin or medicines you take. ? How much food you eat. Lifestyle  Do not use any tobacco products. These include cigarettes, chewing tobacco, and e-cigarettes. If you need help quitting, ask your doctor.  Ask your doctor how much alcohol is safe for you.  Learn to deal with stress. If you need help with this, ask your doctor. Body care   Stay up to date with your shots (immunizations).  Have your eyes and feet checked by a doctor as often as told.  Check your skin and feet every day. Check for cuts, bruises, redness, blisters, or sores.  Brush your teeth and gums two times a day. Floss one or more times a day.  Go to the dentist one or more times every 6 months.  Stay at a healthy weight. General instructions  Take over-the-counter and prescription medicines only as told by your doctor.  Share your diabetes care plan with: ? Your work or  school. ? People you live with.  Carry a card or wear jewelry that says you have diabetes.  Keep all follow-up visits as told by your doctor. This is important. Questions to ask your doctor  Do I need to meet with a diabetes educator?  Where can I find a support group for people with diabetes? Where to find more information To learn more about diabetes, visit:  American Diabetes Association: www.diabetes.org  American Association of Diabetes Educators: www.diabeteseducator.org Summary  When you have type 2 diabetes, you must make sure your blood sugar (glucose) stays in a healthy range.  Check your blood sugar every day, as often as told.  Having diabetes can raise your risk for other conditions. Your doctor may prescribe medicines to help you not have these problems.  Keep all follow-up visits as told by your doctor.  This is important. This information is not intended to replace advice given to you by your health care provider. Make sure you discuss any questions you have with your health care provider. Document Revised: 07/25/2017 Document Reviewed: 03/07/2015 Elsevier Patient Education  [employer].   Diabetes Mellitus and Nutrition, Adult When you have diabetes (diabetes mellitus), it is very important to have healthy eating habits because your blood sugar (glucose) levels are greatly affected by what you eat and drink. Eating healthy foods in the appropriate amounts, at about the same times every day, can help you:  Control your blood glucose.  Lower your risk of heart disease.  Improve your blood pressure.  Reach or maintain a healthy weight. Every person with diabetes is different, and each person has different needs for a meal plan. Your health care provider may recommend that you work with a diet and nutrition specialist (dietitian) to make a meal plan that is best for you. Your meal plan may vary depending on factors such as:  The calories you need.  The  medicines you take.  Your weight.  Your blood glucose, blood pressure, and cholesterol levels.  Your activity level.  Other health conditions you have, such as heart or kidney disease. How do carbohydrates affect me? Carbohydrates, also called carbs, affect your blood glucose level more than any other type of food. Eating carbs naturally raises the amount of glucose in your blood. Carb counting is a method for keeping track of how many carbs you eat. Counting carbs is important to keep your blood glucose at a healthy level, especially if you use insulin or take certain oral diabetes medicines. It is important to know how many carbs you can safely have in each meal. This is different for every person. Your dietitian can help you calculate how many carbs you should have at each meal and for each snack. Foods that contain carbs include:  Bread, cereal, rice, pasta, and crackers.  Potatoes and corn.  Peas, beans, and lentils.  Milk and yogurt.  Fruit and juice.  Desserts, such as cakes, cookies, ice cream, and candy. How does alcohol affect me? Alcohol can cause a sudden decrease in blood glucose (hypoglycemia), especially if you use insulin or take certain oral diabetes medicines. Hypoglycemia can be a life-threatening condition. Symptoms of hypoglycemia (sleepiness, dizziness, and confusion) are similar to symptoms of having too much alcohol. If your health care provider says that alcohol is safe for you, follow these guidelines:  Limit alcohol intake to no more than 1 drink per day for nonpregnant women and 2 drinks per day for men. One drink equals 12 oz of beer, 5 oz of wine, or 1 oz of hard liquor.  Do not drink on an empty stomach.  Keep yourself hydrated with water, diet soda, or unsweetened iced tea.  Keep in mind that regular soda, juice, and other mixers may contain a lot of sugar and must be counted as carbs. What are tips for following this plan?  Reading food  labels  Start by checking the serving size on the "Nutrition Facts" label of packaged foods and drinks. The amount of calories, carbs, fats, and other nutrients listed on the label is based on one serving of the item. Many items contain more than one serving per package.  Check the total grams (g) of carbs in one serving. You can calculate the number of servings of carbs in one serving by dividing the total carbs by 15. For example, if  a food has 30 g of total carbs, it would be equal to 2 servings of carbs.  Check the number of grams (g) of saturated and trans fats in one serving. Choose foods that have low or no amount of these fats.  Check the number of milligrams (mg) of salt (sodium) in one serving. Most people should limit total sodium intake to less than 2,300 mg per day.  Always check the nutrition information of foods labeled as "low-fat" or "nonfat". These foods may be higher in added sugar or refined carbs and should be avoided.  Talk to your dietitian to identify your daily goals for nutrients listed on the label. Shopping  Avoid buying canned, premade, or processed foods. These foods tend to be high in fat, sodium, and added sugar.  Shop around the outside edge of the grocery store. This includes fresh fruits and vegetables, bulk grains, fresh meats, and fresh dairy. Cooking  Use low-heat cooking methods, such as baking, instead of high-heat cooking methods like deep frying.  Cook using healthy oils, such as olive, canola, or sunflower oil.  Avoid cooking with butter, cream, or high-fat meats. Meal planning  Eat meals and snacks regularly, preferably at the same times every day. Avoid going long periods of time without eating.  Eat foods high in fiber, such as fresh fruits, vegetables, beans, and whole grains. Talk to your dietitian about how many servings of carbs you can eat at each meal.  Eat 4-6 ounces (oz) of lean protein each day, such as lean meat, chicken, fish,  eggs, or tofu. One oz of lean protein is equal to: ? 1 oz of meat, chicken, or fish. ? 1 egg. ?  cup of tofu.  Eat some foods each day that contain healthy fats, such as avocado, nuts, seeds, and fish. Lifestyle  Check your blood glucose regularly.  Exercise regularly as told by your health care provider. This may include: ? 150 minutes of moderate-intensity or vigorous-intensity exercise each week. This could be brisk walking, biking, or water aerobics. ? Stretching and doing strength exercises, such as yoga or weightlifting, at least 2 times a week.  Take medicines as told by your health care provider.  Do not use any products that contain nicotine or tobacco, such as cigarettes and e-cigarettes. If you need help quitting, ask your health care provider.  Work with a Social worker or diabetes educator to identify strategies to manage stress and any emotional and social challenges. Questions to ask a health care provider  Do I need to meet with a diabetes educator?  Do I need to meet with a dietitian?  What number can I call if I have questions?  When are the best times to check my blood glucose? Where to find more information:  American Diabetes Association: diabetes.org  Academy of Nutrition and Dietetics: www.eatright.[employer] of Diabetes and Digestive and Kidney Diseases (NIH): [employer] Summary  A healthy meal plan will help you control your blood glucose and maintain a healthy lifestyle.  Working with a diet and nutrition specialist (dietitian) can help you make a meal plan that is best for you.  Keep in mind that carbohydrates (carbs) and alcohol have immediate effects on your blood glucose levels. It is important to count carbs and to use alcohol carefully. This information is not intended to replace advice given to you by your health care provider. Make sure you discuss any questions you have with your health care provider. Document Revised:  01/14/2017 Document  Reviewed: 03/08/2016 Elsevier Patient Education  [employer].

## 2019-11-28 NOTE — Telephone Encounter (Signed)
Copied from CRM [id number]. Topic: General - Other >> Nov 28, 2019  9:49 AM Dalphine Handing A wrote: Patient has been having stomach related problems like nausea and diarrhea since she has been taking metformin since August. Patient feels that she is having a lot of issues while on this medication and would like to know if there is an alternative medication that can be called in without the side effects she has been having. Patient would like a callback from nurse . Patient will be unavailable from 1130-1230 today. Please advise

## 2019-11-30 DIAGNOSIS — R262 Difficulty in walking, not elsewhere classified: Secondary | ICD-10-CM | POA: Diagnosis not present

## 2019-11-30 DIAGNOSIS — M25662 Stiffness of left knee, not elsewhere classified: Secondary | ICD-10-CM | POA: Diagnosis not present

## 2019-11-30 DIAGNOSIS — M6281 Muscle weakness (generalized): Secondary | ICD-10-CM | POA: Diagnosis not present

## 2019-11-30 DIAGNOSIS — Z96652 Presence of left artificial knee joint: Secondary | ICD-10-CM | POA: Diagnosis not present

## 2019-12-04 ENCOUNTER — Ambulatory Visit (INDEPENDENT_AMBULATORY_CARE_PROVIDER_SITE_OTHER): Payer: Medicare PPO

## 2019-12-04 ENCOUNTER — Other Ambulatory Visit: Payer: Self-pay

## 2019-12-04 DIAGNOSIS — M25662 Stiffness of left knee, not elsewhere classified: Secondary | ICD-10-CM | POA: Diagnosis not present

## 2019-12-04 DIAGNOSIS — Z96652 Presence of left artificial knee joint: Secondary | ICD-10-CM | POA: Diagnosis not present

## 2019-12-04 DIAGNOSIS — Z0000 Encounter for general adult medical examination without abnormal findings: Secondary | ICD-10-CM

## 2019-12-04 DIAGNOSIS — M6281 Muscle weakness (generalized): Secondary | ICD-10-CM | POA: Diagnosis not present

## 2019-12-04 DIAGNOSIS — R262 Difficulty in walking, not elsewhere classified: Secondary | ICD-10-CM | POA: Diagnosis not present

## 2019-12-04 NOTE — Progress Notes (Signed)
Subjective:   Renee Young is a 74 y.o. female who presents for Medicare Annual (Subsequent) preventive examination.  Virtual Visit via Telephone Note  I connected with  Renee Young on 12/04/19 at  3:30 PM EDT by telephone and verified that I am speaking with the correct person using two identifiers.  Medicare Annual Wellness visit completed telephonically due to Covid-19 pandemic.   Location: Patient: home Provider: [facility]   I discussed the limitations, risks, security and privacy concerns of performing an evaluation and management service by telephone and the availability of in person appointments. The patient expressed understanding and agreed to proceed.  Unable to perform video visit due to video visit attempted and failed and/or patient does not have video capability.   Some vital signs may be absent or patient reported.   Clemetine Marker, LPN    Review of Systems     Cardiac Risk Factors include: advanced age (>47mn, >>80women);diabetes mellitus;dyslipidemia     Objective:    There were no vitals filed for this visit. There is no height or weight on file to calculate BMI.  Advanced Directives 12/04/2019 10/08/2019 09/26/2019 12/24/2016 03/18/2016 06/25/2014  Does Patient Have a Medical Advance Directive? _0  No  Would patient like information on creating a medical advance directive? Yes (MAU/Ambulatory/Procedural Areas - Information given) No - Patient declined - Yes (MAU/Ambulatory/Procedural Areas - Information given) - No - patient declined information    Current Medications (verified) Outpatient Encounter Medications as of 12/04/2019  Medication Sig  . Accu-Chek Softclix Lancets lancets   . amphetamine-dextroamphetamine (ADDERALL) 20 MG tablet Take 20 mg by mouth daily.   . blood glucose meter kit and supplies KIT Dispense based on patient and insurance preference. Use up to four times daily as directed. (FOR ICD-9 250.00, 250.01).  . cholecalciferol  (VITAMIN D) 1000 units tablet Take 1,000 Units by mouth daily.  .Marland KitchenFLUoxetine (PROZAC) 20 MG capsule Take 40 mg by mouth at bedtime.   . sitaGLIPtin (JANUVIA) 50 MG tablet Take 1 tablet (50 mg total) by mouth daily.  . [DISCONTINUED] atorvastatin (LIPITOR) 20 MG tablet Take 1 tablet (20 mg total) by mouth daily. (Patient not taking: Reported on 11/28/2019)  . [DISCONTINUED] gabapentin (NEURONTIN) 300 MG capsule Take a 300 mg capsule three times a day for two weeks following surgery.Then take a 300 mg capsule two times a day for two weeks. Then take a 300 mg capsule once a day for two weeks. Then discontinue. (Patient not taking: Reported on 11/28/2019)  . [DISCONTINUED] metFORMIN (GLUCOPHAGE) 500 MG tablet Take 1 tablet (500 mg total) by mouth 2 (two) times daily with a meal.  . [DISCONTINUED] methocarbamol (ROBAXIN) 500 MG tablet Take 1 tablet (500 mg total) by mouth every 6 (six) hours as needed for muscle spasms. (Patient not taking: Reported on 11/28/2019)  . [DISCONTINUED] oxyCODONE (ROXICODONE) 5 MG immediate release tablet Take 1-2 tablets (5-10 mg total) by mouth every 6 (six) hours as needed for moderate pain or severe pain. (Patient not taking: Reported on 11/28/2019)   No facility-administered encounter medications on file as of 12/04/2019.    Allergies (verified) Nitrofurantoin and Penicillins   History: Past Medical History:  Diagnosis Date  . ADD (attention deficit disorder)   . Allergy   . Anemia   . Arthritis   . Chicken pox   . Colon polyps   . Depression   . Diabetes mellitus without complication ([facility]   . GERD (gastroesophageal reflux  disease)   . Kidney stones   . Phlebitis   . UTI (urinary tract infection)   . UTI (urinary tract infection)    Past Surgical History:  Procedure Laterality Date  . CESAREAN SECTION    . TOTAL KNEE ARTHROPLASTY Left 10/08/2019   Procedure: TOTAL KNEE ARTHROPLASTY;  Surgeon: Gaynelle Arabian, MD;  Location: [facility];  Service:  Orthopedics;  Laterality: Left;  . TUBAL LIGATION    . URETEROCELE INCISION    . VASCULAR SURGERY     varicose vein laser surgery   Family History  Problem Relation Age of Onset  . Cancer Mother   . Kidney disease Mother   . Diabetes Father   . Heart disease Father   . Hyperlipidemia Father   . Hypertension Father   . Stroke Father   . Arthritis Sister   . Obesity Brother   . Arthritis Brother   . Heart disease Maternal Grandmother   . Stroke Maternal Grandmother   . Hypertension Maternal Grandmother   . Heart disease Maternal Grandfather   . Hyperlipidemia Maternal Grandfather   . Stroke Paternal Grandmother   . Heart disease Paternal Grandmother   . Heart disease Paternal Grandfather   . Heart attack Paternal Grandfather   . Arthritis Daughter    Social History   Socioeconomic History  . Marital status: Married    Spouse name: Not on file  . Number of children: Not on file  . Years of education: Not on file  . Highest education level: Not on file  Occupational History  . Not on file  Tobacco Use  . Smoking status: Never Smoker  . Smokeless tobacco: Never Used  Vaping Use  . Vaping Use: Never used  Substance and Sexual Activity  . Alcohol use: Yes    Alcohol/week: 2.0 standard drinks    Types: 2 Glasses of wine per week    Comment: occas.  . Drug use: No  . Sexual activity: Yes  Other Topics Concern  . Not on file  Social History Narrative   Married   Retired Programmer, applications   Social Determinants of Health   Financial Resource Strain: [place]   . Difficulty of Paying Living Expenses: Not hard at all  Food Insecurity: No Food Insecurity  . Worried About Charity fundraiser in the Last Year: Never true  . Ran Out of Food in the Last Year: Never true  Transportation Needs: No Transportation Needs  . Lack of Transportation (Medical): No  . Lack of Transportation (Non-Medical): No  Physical Activity: Sufficiently Active  . Days of Exercise per  Week: 7 days  . Minutes of Exercise per Session: 30 min  Stress: No Stress Concern Present  . Feeling of Stress : Not at all  Social Connections: Moderately Isolated  . Frequency of Communication with Friends and Family: More than three times a week  . Frequency of Social Gatherings with Friends and Family: More than three times a week  . Attends Religious Services: Never  . Active Member of Clubs or Organizations: No  . Attends Archivist Meetings: Never  . Marital Status: Married    Tobacco Counseling Counseling given: Not Answered   Clinical Intake:  Pre-visit preparation completed: Yes  Pain : No/denies pain     Nutritional Risks: None Diabetes: Yes CBG done?: No Did pt. bring in CBG monitor from home?: No  How often do you need to have someone help you when you read instructions,  pamphlets, or other written materials from your doctor or pharmacy?: 1 - Never  Nutrition Risk Assessment:  Has the patient had any N/V/D within the last 2 months?  Yes  Does the patient have any non-healing wounds?  No  Has the patient had any unintentional weight loss or weight gain?  No   Diabetes:  Is the patient diabetic?  Yes  If diabetic, was a CBG obtained today?  No  Did the patient bring in their glucometer from home?  No  How often do you monitor your CBG's? Patient is awaiting glucose strips and plans to test twice daily.   Financial Strains and Diabetes Management:  Are you having any financial strains with the device, your supplies or your medication? No .  Does the patient want to be seen by Chronic Care Management for management of their diabetes?  No  Would the patient like to be referred to a Nutritionist or for Diabetic Management?  No   Diabetic Exams:  Diabetic Eye Exam: Completed in June per patient need to request records.   Diabetic Foot Exam: Pt has been advised about the importance in completing this exam. Pt is scheduled for diabetic foot exam  on 01/29/20.    Interpreter Needed?: No  Information entered by :: Clemetine Marker LPN   Activities of Daily Living In your present state of health, do you have any difficulty performing the following activities: 12/04/2019 11/28/2019  Hearing? Y Y  Comment plans to have hearing eval -  Vision? N N  Difficulty concentrating or making decisions? N N  Walking or climbing stairs? Y Y  Dressing or bathing? N N  Doing errands, shopping? N N  Preparing Food and eating ? N -  Using the Toilet? N -  In the past six months, have you accidently leaked urine? N -  Do you have problems with loss of bowel control? N -  Managing your Medications? N -  Managing your Finances? N -  Housekeeping or managing your Housekeeping? N -  Some recent data might be hidden    Patient Care Team: Towanda Malkin, MD as PCP - General (Internal Medicine) Laurence Aly, OD (Optometry)  Indicate any recent Medical Services you may have received from other than [facility] providers in the past year (date may be approximate).     Assessment:   This is a routine wellness examination for [place].  Hearing/Vision screen  Hearing Screening   _0  _1  _2  _3  _4  _5  _6  _7  _8   Right ear:           Left ear:           Comments: Pt states mild hearing difficulty; had hearing eval at [facility] previously and was too early to need hearing aids.   Vision Screening Comments: Annual vision screening done by Dr. Ronnald Ramp at [facility] in [place]  Dietary issues and exercise activities discussed: Current Exercise Habits: Home exercise routine, Type of exercise: walking;calisthenics;Other - see comments (exercise bike), Time (Minutes): 30, Frequency (Times/Week): 7, Weekly Exercise (Minutes/Week): 210, Intensity: Mild, Exercise limited by: orthopedic condition(s)  Goals   None    Depression Screen PHQ 2/9 Scores 12/04/2019 11/28/2019 09/25/2019 05/30/2019 04/20/2017 01/11/2017 12/24/2016  PHQ - 2 Score 0 1 0  0 0 0 1  PHQ- 9 Score - 6 0 0 - - -    Fall Risk Fall Risk  12/04/2019 11/28/2019 09/25/2019 05/30/2019 04/20/2017  Falls in the past year? _9 Yes  Comment - - - -  fell on Sunday, 04/17/17  Number falls in past yr: _0 0 -  Comment - - - - -  Injury with Fall? 0 0 0 0 -  Comment - - - - -  Risk for fall due to : History of fall(s) - - - -  Follow up Falls prevention discussed - - - -    Any stairs in or around the home? Yes  If so, are there any without handrails? No  Home free of loose throw rugs in walkways, pet beds, electrical cords, etc? Yes  Adequate lighting in your home to reduce risk of falls? Yes   ASSISTIVE DEVICES UTILIZED TO PREVENT FALLS:  Life alert? No  Use of a cane, walker or w/c? No  Grab bars in the bathroom? Yes  Shower chair or bench in shower? Yes  Elevated toilet seat or a handicapped toilet? No   TIMED UP AND GO:  Was the test performed? No . Telephonic visit.   Cognitive Function:     6CIT Screen 12/04/2019 12/24/2016  What Year? 0 points 0 points  What month? 0 points 0 points  What time? 0 points 0 points  Count back from 20 0 points 0 points  Months in reverse 0 points 0 points  Repeat phrase 0 points 2 points  Total Score 0 2    Immunizations Immunization History  Administered Date(s) Administered  . Influenza, High Dose Seasonal PF 12/06/2017  . Influenza, Seasonal, Injecte, Preservative Fre 04/05/2012  . Influenza,inj,Quad PF,6+ Mos 01/07/2014, 01/06/2015, 12/24/2016  . Moderna SARS-COVID-2 Vaccination 04/11/2019, 05/09/2019  . Pneumococcal Conjugate-13 01/07/2014  . Pneumococcal Polysaccharide-23 04/27/2012  . Tdap 04/27/2012  . Zoster 10/03/2007    TDAP status: Up to date   Flu Vaccine status: due for 2021-2022  Pneumococcal vaccine status: Up to date   Covid-19 vaccine status: Completed vaccines  Qualifies for Shingles Vaccine? Yes   Zostavax completed Yes   Shingrix Completed?: No.    Education has been provided  regarding the importance of this vaccine. Patient has been advised to call insurance company to determine out of pocket expense if they have not yet received this vaccine. Advised may also receive vaccine at local pharmacy or Health Dept. Verbalized acceptance and understanding.  Screening Tests Health Maintenance  Topic Date Due  . FOOT EXAM  11/29/2014  . COLONOSCOPY  12/28/2018  . INFLUENZA VACCINE  09/16/2019  . DEXA SCAN  09/24/2020 (Originally 03/22/2011)  . Hepatitis C Screening  09/24/2020 (Originally 1945/08/03)  . HEMOGLOBIN A1C  03/27/2020  . OPHTHALMOLOGY EXAM  08/15/2020  . URINE MICROALBUMIN  09/24/2020  . MAMMOGRAM  04/16/2021  . TETANUS/TDAP  04/28/2022  . COVID-19 Vaccine  Completed  . PNA vac Low Risk Adult  Completed    Health Maintenance  Health Maintenance Due  Topic Date Due  . FOOT EXAM  11/29/2014  . COLONOSCOPY  12/28/2018  . INFLUENZA VACCINE  09/16/2019   Colorectal cancer screening status: completed 12/27/13. Repeat every 5 years. Pt declined repeat screening at this time.   Mammogram status: Completed 04/17/19. Repeat every year   Bone density screening status: pt declined  Lung Cancer Screening: (Low Dose CT Chest recommended if Age 43-80 years, 30 pack-year currently smoking OR have quit w/in 15years.) does not qualify.   Additional Screening:  Hepatitis C Screening: does qualify; postponed  Vision Screening: Recommended annual ophthalmology exams for early detection of glaucoma and other disorders of the eye. Is the patient up to date with  their annual eye exam?  Yes  Who is the provider or what is the name of the office in which the patient attends annual eye exams? Dr. Ronnald Ramp at [facility] at [place]: Recommended annual dental exams for proper oral hygiene  Community Resource Referral / Chronic Care Management: CRR required this visit?  No   CCM required this visit?  No      Plan:     I have personally  reviewed and noted the following in the patient's chart:   . Medical and social history . Use of alcohol, tobacco or illicit drugs  . Current medications and supplements . Functional ability and status . Nutritional status . Physical activity . Advanced directives . List of other physicians . Hospitalizations, surgeries, and ER visits in previous 12 months . Vitals . Screenings to include cognitive, depression, and falls . Referrals and appointments  In addition, I have reviewed and discussed with patient certain preventive protocols, quality metrics, and best practice recommendations. A written personalized care plan for preventive services as well as general preventive health recommendations were provided to patient.     Clemetine Marker, LPN   [id number]   Nurse Notes: none

## 2019-12-04 NOTE — Patient Instructions (Signed)
Renee Young , Thank you for taking time to come for your Medicare Wellness Visit. I appreciate your ongoing commitment to your health goals. Please review the following plan we discussed and let me know if I can assist you in the future.   Screening recommendations/referrals: Colonoscopy: done 12/27/13. DUE for repeat screening colonoscopy.  Mammogram: done 04/17/19 Bone Density: declined Recommended yearly ophthalmology/optometry visit for glaucoma screening and checkup Recommended yearly dental visit for hygiene and checkup  Vaccinations: Influenza vaccine: due Pneumococcal vaccine: done 01/07/14 Tdap vaccine: done 04/27/12 Shingles vaccine: Shingrix discussed. Please contact your pharmacy for coverage information.  Covid-19: done 04/11/19 & 05/09/19  Advanced directives: Advance directive discussed with you today. I have provided a copy for you to complete at home and have notarized. Once this is complete please bring a copy in to our office so we can scan it into your chart.  Conditions/risks identified: Keep up the great work!  Next appointment: Follow up in one year for your annual wellness visit    Preventive Care 65 Years and Older, Female Preventive care refers to lifestyle choices and visits with your health care provider that can promote health and wellness. What does preventive care include?  A yearly physical exam. This is also called an annual well check.  Dental exams once or twice a year.  Routine eye exams. Ask your health care provider how often you should have your eyes checked.  Personal lifestyle choices, including:  Daily care of your teeth and gums.  Regular physical activity.  Eating a healthy diet.  Avoiding tobacco and drug use.  Limiting alcohol use.  Practicing safe sex.  Taking low-dose aspirin every day.  Taking vitamin and mineral supplements as recommended by your health care provider. What happens during an annual well check? The services  and screenings done by your health care provider during your annual well check will depend on your age, overall health, lifestyle risk factors, and family history of disease. Counseling  Your health care provider may ask you questions about your:  Alcohol use.  Tobacco use.  Drug use.  Emotional well-being.  Home and relationship well-being.  Sexual activity.  Eating habits.  History of falls.  Memory and ability to understand (cognition).  Work and work Astronomer.  Reproductive health. Screening  You may have the following tests or measurements:  Height, weight, and BMI.  Blood pressure.  Lipid and cholesterol levels. These may be checked every 5 years, or more frequently if you are over 60 years old.  Skin check.  Lung cancer screening. You may have this screening every year starting at age 98 if you have a 30-pack-year history of smoking and currently smoke or have quit within the past 15 years.  Fecal occult blood test (FOBT) of the stool. You may have this test every year starting at age 42.  Flexible sigmoidoscopy or colonoscopy. You may have a sigmoidoscopy every 5 years or a colonoscopy every 10 years starting at age 31.  Hepatitis C blood test.  Hepatitis B blood test.  Sexually transmitted disease (STD) testing.  Diabetes screening. This is done by checking your blood sugar (glucose) after you have not eaten for a while (fasting). You may have this done every 1-3 years.  Bone density scan. This is done to screen for osteoporosis. You may have this done starting at age 53.  Mammogram. This may be done every 1-2 years. Talk to your health care provider about how often you should have regular mammograms.  Talk with your health care provider about your test results, treatment options, and if necessary, the need for more tests. Vaccines  Your health care provider may recommend certain vaccines, such as:  Influenza vaccine. This is recommended every  year.  Tetanus, diphtheria, and acellular pertussis (Tdap, Td) vaccine. You may need a Td booster every 10 years.  Zoster vaccine. You may need this after age 35.  Pneumococcal 13-valent conjugate (PCV13) vaccine. One dose is recommended after age 26.  Pneumococcal polysaccharide (PPSV23) vaccine. One dose is recommended after age 85. Talk to your health care provider about which screenings and vaccines you need and how often you need them. This information is not intended to replace advice given to you by your health care provider. Make sure you discuss any questions you have with your health care provider. Document Released: 02/28/2015 Document Revised: 10/22/2015 Document Reviewed: 12/03/2014 Elsevier Interactive Patient Education  2017 [place] Prevention in the Home Falls can cause injuries. They can happen to people of all ages. There are many things you can do to make your home safe and to help prevent falls. What can I do on the outside of my home?  Regularly fix the edges of walkways and driveways and fix any cracks.  Remove anything that might make you trip as you walk through a door, such as a raised step or threshold.  Trim any bushes or trees on the path to your home.  Use bright outdoor lighting.  Clear any walking paths of anything that might make someone trip, such as rocks or tools.  Regularly check to see if handrails are loose or broken. Make sure that both sides of any steps have handrails.  Any raised decks and porches should have guardrails on the edges.  Have any leaves, snow, or ice cleared regularly.  Use sand or salt on walking paths during winter.  Clean up any spills in your garage right away. This includes oil or grease spills. What can I do in the bathroom?  Use night lights.  Install grab bars by the toilet and in the tub and shower. Do not use towel bars as grab bars.  Use non-skid mats or decals in the tub or shower.  If you  need to sit down in the shower, use a plastic, non-slip stool.  Keep the floor dry. Clean up any water that spills on the floor as soon as it happens.  Remove soap buildup in the tub or shower regularly.  Attach bath mats securely with double-sided non-slip rug tape.  Do not have throw rugs and other things on the floor that can make you trip. What can I do in the bedroom?  Use night lights.  Make sure that you have a light by your bed that is easy to reach.  Do not use any sheets or blankets that are too big for your bed. They should not hang down onto the floor.  Have a firm chair that has side arms. You can use this for support while you get dressed.  Do not have throw rugs and other things on the floor that can make you trip. What can I do in the kitchen?  Clean up any spills right away.  Avoid walking on wet floors.  Keep items that you use a lot in easy-to-reach places.  If you need to reach something above you, use a strong step stool that has a grab bar.  Keep electrical cords out of the way.  Do not use floor polish or wax that makes floors slippery. If you must use wax, use non-skid floor wax.  Do not have throw rugs and other things on the floor that can make you trip. What can I do with my stairs?  Do not leave any items on the stairs.  Make sure that there are handrails on both sides of the stairs and use them. Fix handrails that are broken or loose. Make sure that handrails are as long as the stairways.  Check any carpeting to make sure that it is firmly attached to the stairs. Fix any carpet that is loose or worn.  Avoid having throw rugs at the top or bottom of the stairs. If you do have throw rugs, attach them to the floor with carpet tape.  Make sure that you have a light switch at the top of the stairs and the bottom of the stairs. If you do not have them, ask someone to add them for you. What else can I do to help prevent falls?  Wear shoes  that:  Do not have high heels.  Have rubber bottoms.  Are comfortable and fit you well.  Are closed at the toe. Do not wear sandals.  If you use a stepladder:  Make sure that it is fully opened. Do not climb a closed stepladder.  Make sure that both sides of the stepladder are locked into place.  Ask someone to hold it for you, if possible.  Clearly mark and make sure that you can see:  Any grab bars or handrails.  First and last steps.  Where the edge of each step is.  Use tools that help you move around (mobility aids) if they are needed. These include:  Canes.  Walkers.  Scooters.  Crutches.  Turn on the lights when you go into a dark area. Replace any light bulbs as soon as they burn out.  Set up your furniture so you have a clear path. Avoid moving your furniture around.  If any of your floors are uneven, fix them.  If there are any pets around you, be aware of where they are.  Review your medicines with your doctor. Some medicines can make you feel dizzy. This can increase your chance of falling. Ask your doctor what other things that you can do to help prevent falls. This information is not intended to replace advice given to you by your health care provider. Make sure you discuss any questions you have with your health care provider. Document Released: 11/28/2008 Document Revised: 07/10/2015 Document Reviewed: 03/08/2014 Elsevier Interactive Patient Education  2017 [employer].

## 2019-12-06 DIAGNOSIS — M25662 Stiffness of left knee, not elsewhere classified: Secondary | ICD-10-CM | POA: Diagnosis not present

## 2019-12-06 DIAGNOSIS — M6281 Muscle weakness (generalized): Secondary | ICD-10-CM | POA: Diagnosis not present

## 2019-12-06 DIAGNOSIS — R262 Difficulty in walking, not elsewhere classified: Secondary | ICD-10-CM | POA: Diagnosis not present

## 2019-12-06 DIAGNOSIS — Z96652 Presence of left artificial knee joint: Secondary | ICD-10-CM | POA: Diagnosis not present

## 2019-12-11 DIAGNOSIS — R262 Difficulty in walking, not elsewhere classified: Secondary | ICD-10-CM | POA: Diagnosis not present

## 2019-12-11 DIAGNOSIS — Z96652 Presence of left artificial knee joint: Secondary | ICD-10-CM | POA: Diagnosis not present

## 2019-12-11 DIAGNOSIS — M6281 Muscle weakness (generalized): Secondary | ICD-10-CM | POA: Diagnosis not present

## 2019-12-11 DIAGNOSIS — M25662 Stiffness of left knee, not elsewhere classified: Secondary | ICD-10-CM | POA: Diagnosis not present

## 2019-12-12 DIAGNOSIS — F902 Attention-deficit hyperactivity disorder, combined type: Secondary | ICD-10-CM | POA: Diagnosis not present

## 2019-12-12 DIAGNOSIS — F411 Generalized anxiety disorder: Secondary | ICD-10-CM | POA: Diagnosis not present

## 2019-12-12 DIAGNOSIS — F332 Major depressive disorder, recurrent severe without psychotic features: Secondary | ICD-10-CM | POA: Diagnosis not present

## 2019-12-14 DIAGNOSIS — Z96652 Presence of left artificial knee joint: Secondary | ICD-10-CM | POA: Diagnosis not present

## 2019-12-14 DIAGNOSIS — R262 Difficulty in walking, not elsewhere classified: Secondary | ICD-10-CM | POA: Diagnosis not present

## 2019-12-14 DIAGNOSIS — M6281 Muscle weakness (generalized): Secondary | ICD-10-CM | POA: Diagnosis not present

## 2019-12-14 DIAGNOSIS — M25662 Stiffness of left knee, not elsewhere classified: Secondary | ICD-10-CM | POA: Diagnosis not present

## 2019-12-17 DIAGNOSIS — M1711 Unilateral primary osteoarthritis, right knee: Secondary | ICD-10-CM | POA: Diagnosis not present

## 2019-12-17 DIAGNOSIS — Z96652 Presence of left artificial knee joint: Secondary | ICD-10-CM | POA: Diagnosis not present

## 2019-12-19 DIAGNOSIS — M25662 Stiffness of left knee, not elsewhere classified: Secondary | ICD-10-CM | POA: Diagnosis not present

## 2019-12-19 DIAGNOSIS — R262 Difficulty in walking, not elsewhere classified: Secondary | ICD-10-CM | POA: Diagnosis not present

## 2019-12-19 DIAGNOSIS — Z96652 Presence of left artificial knee joint: Secondary | ICD-10-CM | POA: Diagnosis not present

## 2019-12-19 DIAGNOSIS — M6281 Muscle weakness (generalized): Secondary | ICD-10-CM | POA: Diagnosis not present

## 2020-01-15 DIAGNOSIS — F332 Major depressive disorder, recurrent severe without psychotic features: Secondary | ICD-10-CM | POA: Diagnosis not present

## 2020-01-15 DIAGNOSIS — F411 Generalized anxiety disorder: Secondary | ICD-10-CM | POA: Diagnosis not present

## 2020-01-15 DIAGNOSIS — F902 Attention-deficit hyperactivity disorder, combined type: Secondary | ICD-10-CM | POA: Diagnosis not present

## 2020-01-23 NOTE — Progress Notes (Signed)
Patient ID: Renee Young, female    DOB: 12-15-1945, 74 y.o.   MRN: [id number]  PCP: Towanda Malkin, MD  Chief Complaint  Patient presents with  . Follow-up    Subjective:   Renee Young is a 74 y.o. female, presents to clinic with CC of the following:  Chief Complaint  Patient presents with  . Follow-up    HPI:  Patient is a 74 year old female Presents for a follow-up after her August, 2021 visit. She was seen for an acute care visit 11/28/2019.  On the follow-up in August, Metformin and a statin were initiated.  She had noted previously she is often averse to medicines and prefers other ways to try to manage if possible.  Of note, she hada total knee replacement on 10/08/2019  Feeling very well  Diabetes Current medication regimen -sitagliptin (Januvia)-50 mg daily was prescribed last visit due to side effect concerns from the Metformin (Discussed potentially changing to the Metformin XR, as that is often better tolerated, although she preferred not to), Last visit I did discuss the many other medicines that are options presently, with the sulfonylureas often the most cost friendly, although can cause concerns for potential hypoglycemia, and do feel there are better medicines to start presently Noted the newer ones often seen on TV, the SGLT2 and GLP-1's, and their benefits and patients with heart disease or kidney disease.  They can often be cost prohibitive. There are other classes and options noted, and felt trying a product like sitagliptin (Januvia) was reasonable presently.  She agreed and prescribed.. checking sugars at home, before breakfast about [address] Lowest is 80's, nothing higher than 150  Lab Results  Component Value Date   HGBA1C 7.3 (H) 09/25/2019   HGBA1C 7.1 (H) 05/30/2019   HGBA1C 6.7 (H) 12/24/2016   Lab Results  Component Value Date   MICROALBUR 8.3 09/25/2019   LDLCALC 195 (H) 05/30/2019   CREATININE 0.88 10/09/2019    Last eye exam-July 2021 Not on an ACE  Hyperlipidemia Medication regimen -Lipitor 20 mg daily was prescribed and not yet started - just not want to take.  I again noted the importance of this medicine especially being diabetic, and the data supporting its use and helping prevent future cardiac events.  She noted last visit that she still would not start the medicine and she noted that again today when tried to encourage starting the statin product. Discussed zetia, other medication options and she is not interested in starting a medication presently.  Working on diet and exercise with knee better for exercise        Lab Results  Component Value Date   CHOL 278 (H) 05/30/2019   HDL 38 (L) 05/30/2019   LDLCALC 195 (H) 05/30/2019   LDLDIRECT 222.0 03/07/2009   TRIG 252 (H) 05/30/2019   CHOLHDL 7.3 (H) 05/30/2019     Status post left knee replacement -has been doing well since the surgery  Hypochromic,microcytic (not anemic) - mild Lab Results  Component Value Date   WBC 10.1 10/09/2019   HGB 11.3 (L) 10/09/2019   HCT 35.6 (L) 10/09/2019   MCV 80.7 10/09/2019   PLT 275 10/09/2019   Lab Results  Component Value Date   IRON 77 09/25/2019   TIBC 380 09/25/2019   FERRITIN 18 09/25/2019    Above CBC was done postop from her total knee.  Was not anemic on prior CBCs. Does have the colon polyp history noted.  No increased fatigue, no black or dark stools, no BRBPR, energy levels are good  ADD - on stimulant. Pt does reports daytime somnolence but states Adderall helps with this which is prescribed by Chapman Moss at [facility]. She noted she really cannot function well without the Adderall. Noted some concerns with a stimulant medicine, especially at her age and the concerning side effects from these, although she notes she tolerates it well. Shewill continue to obtain this prescription through her psychologist, as felt  needed. She denies any palpitations, racing heart, and her blood pressure has always been okay while taking this. I did note concerns with this medicine, especially how it can affect her heart over time with chronic use and ideally trying to lessen its use over time would be a good goal.  Depression Medication regimen-Prozac. She notes she has not had major depression concerns, and that the Prozac was added to help in some respects while taking the Adderall product. She does see a counselor who prescribes this for her in addition to the Adderall product. PHQ 9 reviewed today   CA Screening Colonoscopy:12/2013, repeat in 3-5 years recommended, + polyps, would like not to dowhen discussed getting a repeat colonoscopy again today,  no black stools, BRBPR, no abdominal pains Cervical CA screening: last pap smear was ~15 years ago; always normal Breast CA Screening:lastmammogram 04/17/19, no evidence of malignancy   She did have the Covid vaccine, had booster, had the flu vaccine    Patient Active Problem List   Diagnosis Date Noted  . S/P total knee arthroplasty 10/08/2019  . Tachycardia 09/25/2019  . Hypochromic erythrocytes 09/24/2019  . Microcytic erythrocytes 09/24/2019  . Prediabetes 05/30/2019  . Family history of thyroid disease 05/30/2019  . Primary osteoarthritis of both knees 05/30/2019  . Bilateral temporomandibular joint pain 05/30/2019  . History of colonic polyps 12/17/2016  . Varicose veins of bilateral lower extremities with other complications 81/82/9937  . Pain in limb 07/03/2012  . OA (osteoarthritis) of knee 03/21/2009  . Type 2 diabetes mellitus with hyperglycemia, without long-term current use of insulin ([place]) 07/19/2007  . Mixed hyperlipidemia 07/19/2007  . DEPRESSION 07/19/2007  . ATTENTION DEFICIT DISORDER 07/19/2007      Current Outpatient Medications:  .  ACCU-CHEK GUIDE test strip, , Disp: , Rfl:  .  Accu-Chek Softclix Lancets lancets, ,  Disp: , Rfl:  .  Accu-Chek Softclix Lancets lancets, Accu-Chek Softclix Lancets, Disp: , Rfl:  .  amphetamine-dextroamphetamine (ADDERALL) 20 MG tablet, Take 20 mg by mouth daily. , Disp: , Rfl: 0 .  blood glucose meter kit and supplies KIT, Dispense based on patient and insurance preference. Use up to four times daily as directed. (FOR ICD-9 250.00, 250.01)., Disp: 1 each, Rfl: 0 .  cholecalciferol (VITAMIN D) 1000 units tablet, Take 1,000 Units by mouth daily., Disp: , Rfl:  .  FLUoxetine (PROZAC) 20 MG capsule, Take 40 mg by mouth at bedtime. , Disp: , Rfl:  .  sitaGLIPtin (JANUVIA) 50 MG tablet, Take 1 tablet (50 mg total) by mouth daily., Disp: 90 tablet, Rfl: 1   Allergies  Allergen Reactions  . Nitrofurantoin Hives and Swelling  . Penicillins Swelling    Tolerated Cephalosporin Date: 10/09/19.       Past Surgical History:  Procedure Laterality Date  . CESAREAN SECTION    . TOTAL KNEE ARTHROPLASTY Left 10/08/2019   Procedure: TOTAL KNEE ARTHROPLASTY;  Surgeon: Gaynelle Arabian, MD;  Location: [facility];  Service: Orthopedics;  Laterality: Left;  .  TUBAL LIGATION    . URETEROCELE INCISION    . VASCULAR SURGERY     varicose vein laser surgery     Family History  Problem Relation Age of Onset  . Cancer Mother   . Kidney disease Mother   . Diabetes Father   . Heart disease Father   . Hyperlipidemia Father   . Hypertension Father   . Stroke Father   . Arthritis Sister   . Obesity Brother   . Arthritis Brother   . Heart disease Maternal Grandmother   . Stroke Maternal Grandmother   . Hypertension Maternal Grandmother   . Heart disease Maternal Grandfather   . Hyperlipidemia Maternal Grandfather   . Stroke Paternal Grandmother   . Heart disease Paternal Grandmother   . Heart disease Paternal Grandfather   . Heart attack Paternal Grandfather   . Arthritis Daughter      Social History   Tobacco Use  . Smoking status: Never Smoker  . Smokeless tobacco: Never Used   Substance Use Topics  . Alcohol use: Yes    Alcohol/week: 2.0 standard drinks    Types: 2 Glasses of wine per week    Comment: occas.    With staff assistance, above reviewed with the patient today.  ROS: As per HPI, otherwise no specific complaints on a limited and focused system review   No results found for this or any previous visit (from the past 72 hour(s)).   PHQ2/9: Depression screen [facility] 2/9 01/29/2020 12/04/2019 11/28/2019 09/25/2019 05/30/2019  Decreased Interest 0 0 1 0 0  Down, Depressed, Hopeless 0 0 0 0 0  PHQ - 2 Score 0 0 1 0 0  Altered sleeping 1 - 2 0 0  Tired, decreased energy 0 - 2 0 0  Change in appetite 0 - 1 0 0  Feeling bad or failure about yourself  0 - 0 0 0  Trouble concentrating 0 - 0 0 0  Moving slowly or fidgety/restless 0 - 0 0 0  Suicidal thoughts 0 - 0 0 0  PHQ-9 Score 1 - 6 0 0  Difficult doing work/chores Not difficult at all - Somewhat difficult Not difficult at all Not difficult at all   PHQ-2/9 Result is neg  Fall Risk: Fall Risk  12/04/2019 11/28/2019 09/25/2019 05/30/2019 04/20/2017  Falls in the past year? _0 Yes  Comment - - - - fell on Sunday, 04/17/17  Number falls in past yr: _1 0 -  Comment - - - - -  Injury with Fall? 0 0 0 0 -  Comment - - - - -  Risk for fall due to : History of fall(s) - - - -  Follow up Falls prevention discussed - - - -      Objective:   Vitals:   01/29/20 1002 01/29/20 1007  BP: (!) 150/50 (!) 154/50  Pulse: 100   Resp: 16   Temp: 97.6 F (36.4 C)   TempSrc: Oral   SpO2: 99%   Weight: 166 lb 4.8 oz (75.4 kg)   Height: _2  (1.651 m)     Body mass index is 27.67 kg/m.  Physical Exam   NAD, masked, pleasant HEENT - /AT,positive glasses,sclera anicteric, PERRL, EOMI, conj - non-inj'ed, pharynx clear Neck - supple, no adenopathy, no TM Car -RRRwithout m/g/r, heart rate approximately 92 on my exam Pulm- RR and effort normal at rest, CTA without wheeze or rales Abd - soft,obese,   nontender diffusely,  Back - no CVA tenderness Ext - no LE edema,increased varicosities noted, most concentrated in the left ankle and foot region, much milder on the right, scar present on left knee from prior surgery Neuro/psychiatric - affect was not flat, appropriate with conversation Alert with normal speech  Grossly nonfocal with sensation intact to light touch in the distal extremities, adequate strength noted  Results for orders placed or performed during the hospital encounter of 10/08/19  Glucose, capillary  Result Value Ref Range   Glucose-Capillary 121 (H) 70 - 99 mg/dL   Comment 1 Notify RN    Comment 2 Document in Chart   Glucose, capillary  Result Value Ref Range   Glucose-Capillary 121 (H) 70 - 99 mg/dL   Comment 1 Notify RN   CBC  Result Value Ref Range   WBC 10.1 4.0 - 10.5 K/uL   RBC 4.41 3.87 - 5.11 MIL/uL   Hemoglobin 11.3 (L) 12.0 - 15.0 g/dL   HCT 35.6 (L) 36.0 - 46.0 %   MCV 80.7 80.0 - 100.0 fL   MCH 25.6 (L) 26.0 - 34.0 pg   MCHC 31.7 30.0 - 36.0 g/dL   RDW 14.7 11.5 - 15.5 %   Platelets 275 150 - 400 K/uL   nRBC 0.0 0.0 - 0.2 %  Basic metabolic panel  Result Value Ref Range   Sodium 137 135 - 145 mmol/L   Potassium 4.1 3.5 - 5.1 mmol/L   Chloride 101 98 - 111 mmol/L   CO2 26 22 - 32 mmol/L   Glucose, Bld 196 (H) 70 - 99 mg/dL   BUN 16 8 - 23 mg/dL   Creatinine, Ser 0.88 0.44 - 1.00 mg/dL   Calcium 9.4 8.9 - 10.3 mg/dL   GFR calc non Af Amer >60 >60 mL/min   GFR calc Af Amer >60 >60 mL/min   Anion gap 10 5 - 15  Glucose, capillary  Result Value Ref Range   Glucose-Capillary 157 (H) 70 - 99 mg/dL  Glucose, capillary  Result Value Ref Range   Glucose-Capillary 148 (H) 70 - 99 mg/dL  Glucose, capillary  Result Value Ref Range   Glucose-Capillary 145 (H) 70 - 99 mg/dL  ABO/Rh  Result Value Ref Range   ABO/RH(D)      A POS Performed at [facility], [place] [address], [place], [place] [address]   Type and  screen [facility]  Result Value Ref Range   ABO/RH(D) A POS    Antibody Screen POS    Sample Expiration 10/11/2019,2359    Unit Number [id number]    Blood Component Type RED CELLS,LR    Unit division 00    Status of Unit REL FROM [facility]    Donor AG Type NEGATIVE FOR c ANTIGEN    Transfusion Status OK TO TRANSFUSE    Crossmatch Result COMPATIBLE    Unit Number [id number]    Blood Component Type RED CELLS,LR    Unit division 00    Status of Unit REL FROM [facility]    Donor AG Type NEGATIVE FOR c ANTIGEN    Transfusion Status OK TO TRANSFUSE    Crossmatch Result COMPATIBLE   BPAM RBC  Result Value Ref Range   Blood Product Unit Number [id number]    PRODUCT CODE [id number]    Unit Type and Rh 9024    Blood Product Expiration Date 097353299242    Blood Product Unit Number [id number]    PRODUCT CODE [id number]    Unit  Type and Rh 6200    Blood Product Expiration Date 948016553748    Last labs reviewed Assessment & Plan:     1. Type 2 diabetes mellitus with hyperglycemia, without long-term current use of insulin ([place]) Last visit changed to Januvia daily.  We will continue Januvia presently Has been checking blood sugars at home, with the fasting sugars in the morning excellent, and no low sugar concerns noted Ordered to check a hemoglobin A1c today with a fingerstick in the office, and was told could not be done as they do not have the strips to do that. Discussed with patient getting a blood draw to check the A1c as another option, and with the blood sugars at home being so well controlled, she would prefer not to have a blood draw today, and recheck the labs again on her next follow-up visit which I felt was reasonable.  2. Mixed hyperlipidemia Strongly encouraged her to be on a statin, although she was quite adamant and not wanting to start that medicine again today. Dietary modifications emphasized and information provided in the AVS as well. Also  continuing to try to be more active and be helpful.  3. Hypochromic erythrocytes 4. Microcytic erythrocytes It was previously noted she was not anemic, with her most recent CBC done postop likely causing the mild anemia noted. Iron panel checked previously was normal. She notes she does eat meat and does not have major dietary restrictions. Discussed previously if she was iron deficient, should strongly consider getting a follow-up colonoscopy which she has not wanted to pursue in the recent past after the distant 1 with polyps found. Can recheck a CBC with her next blood draw.  5. Attention deficit disorder (ADD) without hyperactivity 6. Other depression Continues to be followed by an outside provider, who provides her prescriptions, and do have some concerns with continued Adderall use presently.   Trying to convince her to lessen this medicine over time has been challenging previously. Continuing to monitor.  7. History of colon polyps As above, she did not want to have a follow-up colonoscopy as was recommended.  If she was iron deficient, strongly would recommend that. Continue to monitor presently   We will schedule follow-up in 3 months time, sooner as needed Plan to check labs again on that follow-up visit as noted above. She is aware that follow-up will be with a new provider, as I will be leaving this practice prior to that planned follow-up    Towanda Malkin, MD 01/29/20 10:08 AM

## 2020-01-29 ENCOUNTER — Encounter: Payer: Self-pay | Admitting: Internal Medicine

## 2020-01-29 ENCOUNTER — Other Ambulatory Visit: Payer: Self-pay

## 2020-01-29 ENCOUNTER — Ambulatory Visit (INDEPENDENT_AMBULATORY_CARE_PROVIDER_SITE_OTHER): Payer: Medicare PPO | Admitting: Internal Medicine

## 2020-01-29 VITALS — BP 154/50 | HR 100 | Temp 97.6°F | Resp 16 | Ht 65.0 in | Wt 166.3 lb

## 2020-01-29 DIAGNOSIS — R718 Other abnormality of red blood cells: Secondary | ICD-10-CM

## 2020-01-29 DIAGNOSIS — E1165 Type 2 diabetes mellitus with hyperglycemia: Secondary | ICD-10-CM | POA: Diagnosis not present

## 2020-01-29 DIAGNOSIS — F3289 Other specified depressive episodes: Secondary | ICD-10-CM

## 2020-01-29 DIAGNOSIS — D508 Other iron deficiency anemias: Secondary | ICD-10-CM

## 2020-01-29 DIAGNOSIS — E782 Mixed hyperlipidemia: Secondary | ICD-10-CM | POA: Diagnosis not present

## 2020-01-29 DIAGNOSIS — F988 Other specified behavioral and emotional disorders with onset usually occurring in childhood and adolescence: Secondary | ICD-10-CM

## 2020-01-29 DIAGNOSIS — Z860100 Personal history of colon polyps, unspecified: Secondary | ICD-10-CM

## 2020-01-29 NOTE — Patient Instructions (Signed)

## 2020-03-17 ENCOUNTER — Other Ambulatory Visit: Payer: Medicare PPO

## 2020-03-17 DIAGNOSIS — Z20822 Contact with and (suspected) exposure to covid-19: Secondary | ICD-10-CM | POA: Diagnosis not present

## 2020-03-18 LAB — NOVEL CORONAVIRUS, NAA: SARS-CoV-2, NAA: NOT DETECTED

## 2020-03-18 LAB — SARS-COV-2, NAA 2 DAY TAT

## 2020-04-28 ENCOUNTER — Other Ambulatory Visit: Payer: Self-pay | Admitting: Family Medicine

## 2020-04-28 DIAGNOSIS — Z0000 Encounter for general adult medical examination without abnormal findings: Secondary | ICD-10-CM

## 2020-04-29 ENCOUNTER — Ambulatory Visit
Admission: RE | Admit: 2020-04-29 | Discharge: 2020-04-29 | Disposition: A | Discharge status: Home / Self Care | Payer: Medicare PPO | Source: Ambulatory Visit | Attending: Family Medicine | Admitting: Family Medicine

## 2020-04-29 ENCOUNTER — Other Ambulatory Visit: Payer: Self-pay

## 2020-04-29 DIAGNOSIS — Z0000 Encounter for general adult medical examination without abnormal findings: Secondary | ICD-10-CM

## 2020-06-10 ENCOUNTER — Encounter: Payer: Self-pay | Admitting: Family Medicine

## 2020-06-10 ENCOUNTER — Ambulatory Visit (INDEPENDENT_AMBULATORY_CARE_PROVIDER_SITE_OTHER): Payer: Medicare PPO | Admitting: Family Medicine

## 2020-06-10 ENCOUNTER — Other Ambulatory Visit: Payer: Self-pay

## 2020-06-10 VITALS — BP 124/72 | HR 90 | Temp 98.5°F | Resp 14 | Ht 65.0 in | Wt 164.9 lb

## 2020-06-10 DIAGNOSIS — D508 Other iron deficiency anemias: Secondary | ICD-10-CM

## 2020-06-10 DIAGNOSIS — R718 Other abnormality of red blood cells: Secondary | ICD-10-CM

## 2020-06-10 DIAGNOSIS — Z860100 Personal history of colon polyps, unspecified: Secondary | ICD-10-CM

## 2020-06-10 DIAGNOSIS — E782 Mixed hyperlipidemia: Secondary | ICD-10-CM

## 2020-06-10 DIAGNOSIS — Z5181 Encounter for therapeutic drug level monitoring: Secondary | ICD-10-CM

## 2020-06-10 DIAGNOSIS — E1165 Type 2 diabetes mellitus with hyperglycemia: Secondary | ICD-10-CM | POA: Diagnosis not present

## 2020-06-10 DIAGNOSIS — Z1159 Encounter for screening for other viral diseases: Secondary | ICD-10-CM | POA: Diagnosis not present

## 2020-06-10 DIAGNOSIS — F988 Other specified behavioral and emotional disorders with onset usually occurring in childhood and adolescence: Secondary | ICD-10-CM

## 2020-06-10 DIAGNOSIS — F3289 Other specified depressive episodes: Secondary | ICD-10-CM

## 2020-06-10 NOTE — Progress Notes (Signed)
Name: Renee Young   MRN: [id number]    DOB: [birth year]   Date:06/10/2020       Progress Note  Chief Complaint  Patient presents with  . Follow-up    Subjective:   Renee Young is a 75 y.o. 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 Roxan Hockey who recently moved out of the are  DM:   Pt managing DM with [place], stopped metformin recently and started [place] due to GI SE - DM also recently worsened with COVID and change in activity and eating habits Pt has no SE from meds Blood sugars 100 Denies: Polyuria, polydipsia, vision changes, neuropathy, hypoglycemia Recent pertinent labs: Lab Results  Component Value Date   HGBA1C 7.3 (H) 09/25/2019   HGBA1C 7.1 (H) 05/30/2019   HGBA1C 6.7 (H) 12/24/2016   Standard of care and health maintenance: Foot exam:  due DM eye exam:  Due June 2022 ACEI/ARB:  Not on Statin:  Not taking  Hypertension:  Managed with diet/exercise BP Readings from Last 3 Encounters:  06/10/20 124/72  01/29/20 (!) 154/50  11/28/19 122/60  Pt denies CP, SOB, exertional sx, LE edema, palpitation, Ha's, visual disturbances, lightheadedness, hypotension, syncope.  Hyperlipidemia: Not on statin - absolutely opposed to  Last Lipids: Lab Results  Component Value Date   CHOL 278 (H) 05/30/2019   HDL 38 (L) 05/30/2019   LDLCALC 195 (H) 05/30/2019   LDLDIRECT 222.0 03/07/2009   TRIG 252 (H) 05/30/2019   CHOLHDL 7.3 (H) 05/30/2019   - Denies: Chest pain, shortness of breath, myalgias, claudication      Current Outpatient Medications:  .  ACCU-CHEK GUIDE test strip, , Disp: , Rfl:  .  Accu-Chek Softclix Lancets lancets, , Disp: , Rfl:  .  Accu-Chek Softclix Lancets lancets, Accu-Chek Softclix Lancets, Disp: , Rfl:  .  amphetamine-dextroamphetamine (ADDERALL) 20 MG tablet, Take 20 mg by mouth daily. , Disp: , Rfl: 0 .  blood glucose meter kit and supplies KIT, Dispense based on patient and insurance preference.  Use up to four times daily as directed. (FOR ICD-9 250.00, 250.01)., Disp: 1 each, Rfl: 0 .  cholecalciferol (VITAMIN D) 1000 units tablet, Take 1,000 Units by mouth daily., Disp: , Rfl:  .  FLUoxetine (PROZAC) 20 MG capsule, Take 40 mg by mouth at bedtime. , Disp: , Rfl:  .  sitaGLIPtin (JANUVIA) 50 MG tablet, Take 1 tablet (50 mg total) by mouth daily., Disp: 90 tablet, Rfl: 1  Patient Active Problem List   Diagnosis Date Noted  . S/P total knee arthroplasty 10/08/2019  . Tachycardia 09/25/2019  . Hypochromic erythrocytes 09/24/2019  . Microcytic erythrocytes 09/24/2019  . Prediabetes 05/30/2019  . Family history of thyroid disease 05/30/2019  . Primary osteoarthritis of both knees 05/30/2019  . Bilateral temporomandibular joint pain 05/30/2019  . History of colonic polyps 12/17/2016  . Varicose veins of bilateral lower extremities with other complications 58/10/9831  . Pain in limb 07/03/2012  . OA (osteoarthritis) of knee 03/21/2009  . Type 2 diabetes mellitus with hyperglycemia, without long-term current use of insulin ([place]) 07/19/2007  . Mixed hyperlipidemia 07/19/2007  . DEPRESSION 07/19/2007  . ATTENTION DEFICIT DISORDER 07/19/2007    Past Surgical History:  Procedure Laterality Date  . CESAREAN SECTION    . TOTAL KNEE ARTHROPLASTY Left 10/08/2019   Procedure: TOTAL KNEE ARTHROPLASTY;  Surgeon: Gaynelle Arabian, MD;  Location: [facility];  Service: Orthopedics;  Laterality: Left;  . TUBAL LIGATION    .  URETEROCELE INCISION    . VASCULAR SURGERY     varicose vein laser surgery    Family History  Problem Relation Age of Onset  . Cancer Mother   . Kidney disease Mother   . Diabetes Father   . Heart disease Father   . Hyperlipidemia Father   . Hypertension Father   . Stroke Father   . Arthritis Sister   . Obesity Brother   . Arthritis Brother   . Heart disease Maternal Grandmother   . Stroke Maternal Grandmother   . Hypertension Maternal Grandmother   . Heart disease  Maternal Grandfather   . Hyperlipidemia Maternal Grandfather   . Stroke Paternal Grandmother   . Heart disease Paternal Grandmother   . Heart disease Paternal Grandfather   . Heart attack Paternal Grandfather   . Arthritis Daughter     Social History   Tobacco Use  . Smoking status: Never Smoker  . Smokeless tobacco: Never Used  Vaping Use  . Vaping Use: Never used  Substance Use Topics  . Alcohol use: Yes    Alcohol/week: 2.0 standard drinks    Types: 2 Glasses of wine per week    Comment: occas.  . Drug use: No     Allergies  Allergen Reactions  . Nitrofurantoin Hives and Swelling  . Penicillins Swelling    Tolerated Cephalosporin Date: 10/09/19.      Health Maintenance  Topic Date Due  . FOOT EXAM  11/29/2014  . COVID-19 Vaccine (3 - Booster for Moderna series) 11/09/2019  . HEMOGLOBIN A1C  03/27/2020  . DEXA SCAN  09/24/2020 (Originally 03/22/2011)  . Hepatitis C Screening  09/24/2020 (Originally [birth year])  . COLONOSCOPY (Pts 45-39yr Insurance coverage will need to be confirmed)  06/10/2021 (Originally 12/28/2018)  . OPHTHALMOLOGY EXAM  08/15/2020  . INFLUENZA VACCINE  09/15/2020  . URINE MICROALBUMIN  09/24/2020  . TETANUS/TDAP  04/28/2022  . MAMMOGRAM  04/30/2022  . PNA vac Low Risk Adult  Completed  . HPV VACCINES  Aged Out    Chart Review Today: I personally reviewed active problem list, medication list, allergies, family history, social history, health maintenance, notes from last encounter, lab results, imaging with the patient/caregiver today.   Review of Systems  Constitutional: Negative.   HENT: Negative.   Eyes: Negative.   Respiratory: Negative.   Cardiovascular: Negative.   Gastrointestinal: Negative.   Endocrine: Negative.   Genitourinary: Negative.   Musculoskeletal: Negative.   Skin: Negative.   Allergic/Immunologic: Negative.   Neurological: Negative.   Hematological: Negative.   Psychiatric/Behavioral: Negative.   All other  systems reviewed and are negative.    Objective:   Vitals:   06/10/20 0916  BP: 124/72  Pulse: 90  Resp: 14  Temp: 98.5 F (36.9 C)  SpO2: 98%  Weight: 164 lb 14.4 oz (74.8 kg)  Height: '5\' 5"'  (1.651 m)    Body mass index is 27.44 kg/m.  Physical Exam Vitals and nursing note reviewed.  Constitutional:      General: She is not in acute distress.    Appearance: Normal appearance. She is normal weight. She is not ill-appearing, toxic-appearing or diaphoretic.  HENT:     Head: Normocephalic and atraumatic.     Right Ear: External ear normal.     Left Ear: External ear normal.  Eyes:     General:        Right eye: No discharge.        Left eye: No discharge.  Conjunctiva/sclera: Conjunctivae normal.  Cardiovascular:     Rate and Rhythm: Normal rate and regular rhythm.     Pulses: Normal pulses.     Heart sounds: Normal heart sounds. No murmur heard. No friction rub.  Pulmonary:     Effort: Pulmonary effort is normal. No respiratory distress.     Breath sounds: Normal breath sounds. No wheezing or rales.  Abdominal:     General: Bowel sounds are normal.     Palpations: Abdomen is soft.  Musculoskeletal:     Right lower leg: No edema.     Left lower leg: No edema.  Skin:    General: Skin is warm and dry.     Capillary Refill: Capillary refill takes less than 2 seconds.     Coloration: Skin is not jaundiced or pale.     Findings: No rash.  Neurological:     Mental Status: She is alert. Mental status is at baseline.     Gait: Gait normal.  Psychiatric:        Mood and Affect: Mood normal.        Behavior: Behavior normal.         Assessment & Plan:     ICD-10-CM   1. Type 2 diabetes mellitus with hyperglycemia, without long-term current use of insulin (HCC)  E11.65 Hemoglobin A1C    COMPLETE METABOLIC PANEL WITH GFR    Lipid panel   She would like to manage with only diet, she has lost weight, she prefers to DC or decrease meds if able, due for recheck,  has been well controlled  2. Mixed hyperlipidemia  E17.4 COMPLETE METABOLIC PANEL WITH GFR    Lipid panel   Will not take statin, known atherosclerotic disease, discussed indications, mechanism of action, benefits versus risk  3. Encounter for hepatitis C screening test for low risk patient  Z11.59 Hepatitis C Antibody  4. Encounter for medication monitoring  Z51.81 Hemoglobin A1C    COMPLETE METABOLIC PANEL WITH GFR    Lipid panel    Hepatitis C Antibody    CBC with Differential/Platelet  5. Hypochromic erythrocytes  D50.8 CBC with Differential/Platelet  6. Microcytic erythrocytes  R71.8 CBC with Differential/Platelet  7. History of colonic polyps  Z86.010   8. Attention deficit disorder (ADD) without hyperactivity  F98.8    Managed by specialist  9. Other depression  F32.89    Managed by specialist  10. Medication monitoring encounter  Z51.81 Hemoglobin A1C    COMPLETE METABOLIC PANEL WITH GFR    Lipid panel    Hepatitis C Antibody    CBC with Differential/Platelet   Wt Readings from Last 5 Encounters:  06/10/20 164 lb 14.4 oz (74.8 kg)  01/29/20 166 lb 4.8 oz (75.4 kg)  11/28/19 173 lb 3.2 oz (78.6 kg)  10/08/19 177 lb 4.8 oz (80.4 kg)  09/25/19 177 lb 4.8 oz (80.4 kg)   BMI Readings from Last 5 Encounters:  06/10/20 27.44 kg/m  01/29/20 27.67 kg/m  11/28/19 28.82 kg/m  10/08/19 29.50 kg/m  09/25/19 29.50 kg/m     Return in about 6 months (around 12/10/2020) for Routine follow-up.   Delsa Grana, PA-C 06/10/20 9:42 AM

## 2020-06-10 NOTE — Patient Instructions (Signed)
Lab Results  Component Value Date   CHOL 278 (H) 05/30/2019   HDL 38 (L) 05/30/2019   LDLCALC 195 (H) 05/30/2019   LDLDIRECT 222.0 03/07/2009   TRIG 252 (H) 05/30/2019   CHOLHDL 7.3 (H) 05/30/2019   The 10-year ASCVD risk score Denman George DC Jr., et al., 2013) is: 25.9%   Values used to calculate the score:     Age: 75 years     Sex: Female     Is Non-Hispanic African American: No     Diabetic: Yes     Tobacco smoker: No     Systolic Blood Pressure: 124 mmHg     Is BP treated: No     HDL Cholesterol: 38 mg/dL     Total Cholesterol: 278 mg/dL  Lab Results  Component Value Date   HGBA1C 7.3 (H) 09/25/2019   Diabetes Mellitus and Standards of Medical Care Living with and managing diabetes (diabetes mellitus) can be complicated. Your diabetes treatment may be managed by a team of health care providers, including:  A physician who specializes in diabetes (endocrinologist). You might also have visits with a nurse practitioner or physician assistant.  Nurses.  A registered dietitian.  A certified diabetes care and education specialist.  An exercise specialist.  A pharmacist.  An eye doctor.  A foot specialist (podiatrist).  A dental care provider.  A primary care provider.  A mental health care provider. How to manage your diabetes You can do many things to successfully manage your diabetes. Your health care providers will follow guidelines to help you get the best quality of care. Here are general guidelines for your diabetes management plan. Your health care providers may give you more specific instructions. Physical exams When you are diagnosed with diabetes, and each year after that, your health care provider will ask about your medical and family history. You will have a physical exam, which may include:  Measuring your height, weight, and body mass index (BMI).  Checking your blood pressure. This will be done at every routine medical visit. Your target blood pressure  may vary depending on your medical conditions, your age, and other factors.  A thyroid exam.  A skin exam.  Screening for nerve damage (peripheral neuropathy). This may include checking the pulse in your legs and feet and the level of sensation in your hands and feet.  A foot exam to inspect the structure and skin of your feet, including checking for cuts, bruises, redness, blisters, sores, or other problems.  Screening for blood vessel (vascular) problems. This may include checking the pulse in your legs and feet and checking your temperature. Blood tests Depending on your treatment plan and your personal needs, you may have the following tests:  Hemoglobin A1C (HbA1C). This test provides information about blood sugar (glucose) control over the previous 2-3 months. It is used to adjust your treatment plan, if needed. This test will be done: ? At least 2 times a year, if you are meeting your treatment goals. ? 4 times a year, if you are not meeting your treatment goals or if your goals have changed.  Lipid testing, including total cholesterol, LDL and HDL cholesterol, and triglyceride levels. ? The goal for LDL is less than 100 mg/dL (5.5 mmol/L). If you are at high risk for complications, the goal is less than 70 mg/dL (3.9 mmol/L). ? The goal for HDL is 40 mg/dL (2.2 mmol/L) or higher for men, and 50 mg/dL (2.8 mmol/L) or higher for women. An  HDL cholesterol of 60 mg/dL (3.3 mmol/L) or higher gives some protection against heart disease. ? The goal for triglycerides is less than 150 mg/dL (8.3 mmol/L).  Liver function tests.  Kidney function tests.  Thyroid function tests.   Dental and eye exams  Visit your dentist two times a year.  If you have type 1 diabetes, your health care provider may recommend an eye exam within 5 years after you are diagnosed, and then once a year after your first exam. ? For children with type 1 diabetes, the health care provider may recommend an eye exam  when your child is age 64 or older and has had diabetes for 3-5 years. After the first exam, your child should get an eye exam once a year.  If you have type 2 diabetes, your health care provider may recommend an eye exam as soon as you are diagnosed, and then every 1-2 years after your first exam.   Immunizations  A yearly flu (influenza) vaccine is recommended annually for everyone 6 months or older. This is especially important if you have diabetes.  The pneumonia (pneumococcal) vaccine is recommended for everyone 2 years or older who has diabetes. If you are age 78 or older, you may get the pneumonia vaccine as a series of two separate shots.  The hepatitis B vaccine is recommended for adults shortly after being diagnosed with diabetes. Adults and children with diabetes should receive all other vaccines according to age-specific recommendations from the Centers for Disease Control and Prevention (CDC). Mental and emotional health Screening for symptoms of eating disorders, anxiety, and depression is recommended at the time of diagnosis and after as needed. If your screening shows that you have symptoms, you may need more evaluation. You may work with a mental health care provider. Follow these instructions at home: Treatment plan You will monitor your blood glucose levels and may give yourself insulin. Your treatment plan will be reviewed at every medical visit. You and your health care provider will discuss:  How you are taking your medicines, including insulin.  Any side effects you have.  Your blood glucose level target goals.  How often you monitor your blood glucose level.  Lifestyle habits, such as activity level and tobacco, alcohol, and substance use. Education Your health care provider will assess how well you are monitoring your blood glucose levels and whether you are taking your insulin and medicines correctly. He or she may refer you to:  A certified diabetes care and  education specialist to manage your diabetes throughout your life, starting at diagnosis.  A registered dietitian who can create and review your personal nutrition plan.  An exercise specialist who can discuss your activity level and exercise plan. General instructions  Take over-the-counter and prescription medicines only as told by your health care provider.  Keep all follow-up visits. This is important. Where to find support There are many diabetes support networks, including:  American Diabetes Association (ADA): diabetes.org  Defeat Diabetes Foundation: defeatdiabetes.org Where to find more information  American Diabetes Association (ADA): www.diabetes.org  Association of Diabetes Care & Education Specialists (ADCES): diabeteseducator.org  International Diabetes Federation (IDF): http://hill.biz/ Summary  Managing diabetes (diabetes mellitus) can be complicated. Your diabetes treatment may be managed by a team of health care providers.  Your health care providers follow guidelines to help you get the best quality care.  You should have physical exams, blood tests, blood pressure monitoring, immunizations, and screening tests regularly. Stay updated on how to manage your  diabetes.  Your health care providers may also give you more specific instructions based on your individual health. This information is not intended to replace advice given to you by your health care provider. Make sure you discuss any questions you have with your health care provider. Document Revised: 08/09/2019 Document Reviewed: 08/09/2019 Elsevier Patient Education  2021 [employer].

## 2020-06-11 ENCOUNTER — Other Ambulatory Visit: Payer: Self-pay | Admitting: Family Medicine

## 2020-06-11 DIAGNOSIS — E1165 Type 2 diabetes mellitus with hyperglycemia: Secondary | ICD-10-CM

## 2020-06-11 LAB — CBC WITH DIFFERENTIAL/PLATELET
Absolute Monocytes: 480 cells/uL (ref 200–950)
Basophils Absolute: 48 cells/uL (ref 0–200)
Basophils Relative: 1.2 %
Eosinophils Absolute: 240 cells/uL (ref 15–500)
Eosinophils Relative: 6 %
HCT: 41.8 % (ref 35.0–45.0)
Hemoglobin: 13.1 g/dL (ref 11.7–15.5)
Lymphs Abs: 888 cells/uL (ref 850–3900)
MCH: 25.1 pg — ABNORMAL LOW (ref 27.0–33.0)
MCHC: 31.3 g/dL — ABNORMAL LOW (ref 32.0–36.0)
MCV: 80.1 fL (ref 80.0–100.0)
MPV: 8.9 fL (ref 7.5–12.5)
Monocytes Relative: 12 %
Neutro Abs: 2344 cells/uL (ref 1500–7800)
Neutrophils Relative %: 58.6 %
Platelets: 301 10*3/uL (ref 140–400)
RBC: 5.22 10*6/uL — ABNORMAL HIGH (ref 3.80–5.10)
RDW: 14.5 % (ref 11.0–15.0)
Total Lymphocyte: 22.2 %
WBC: 4 10*3/uL (ref 3.8–10.8)

## 2020-06-11 LAB — COMPLETE METABOLIC PANEL WITH GFR
AG Ratio: 1.4 (calc) (ref 1.0–2.5)
ALT: 14 U/L (ref 6–29)
AST: 16 U/L (ref 10–35)
Albumin: 4 g/dL (ref 3.6–5.1)
Alkaline phosphatase (APISO): 81 U/L (ref 37–153)
BUN/Creatinine Ratio: 33 (calc) — ABNORMAL HIGH (ref 6–22)
BUN: 26 mg/dL — ABNORMAL HIGH (ref 7–25)
CO2: 27 mmol/L (ref 20–32)
Calcium: 9.4 mg/dL (ref 8.6–10.4)
Chloride: 105 mmol/L (ref 98–110)
Creat: 0.79 mg/dL (ref 0.60–0.93)
GFR, Est African American: 85 mL/min/{1.73_m2} (ref >=60)
GFR, Est Non African American: 74 mL/min/{1.73_m2} (ref >=60)
Globulin: 2.9 g/dL (calc) (ref 1.9–3.7)
Glucose, Bld: 87 mg/dL (ref 65–99)
Potassium: 4.2 mmol/L (ref 3.5–5.3)
Sodium: 139 mmol/L (ref 135–146)
Total Bilirubin: 0.3 mg/dL (ref 0.2–1.2)
Total Protein: 6.9 g/dL (ref 6.1–8.1)

## 2020-06-11 LAB — LIPID PANEL
Cholesterol: 293 mg/dL — ABNORMAL HIGH (ref <200)
HDL: 50 mg/dL (ref >=50)
LDL Cholesterol (Calc): 218 mg/dL (calc) — ABNORMAL HIGH
Non-HDL Cholesterol (Calc): 243 mg/dL (calc) — ABNORMAL HIGH (ref <130)
Total CHOL/HDL Ratio: 5.9 (calc) — ABNORMAL HIGH (ref <5.0)
Triglycerides: 116 mg/dL (ref <150)

## 2020-06-11 LAB — HEMOGLOBIN A1C
Hgb A1c MFr Bld: 5.8 % of total Hgb — ABNORMAL HIGH (ref <5.7)
Mean Plasma Glucose: 120 mg/dL
eAG (mmol/L): 6.6 mmol/L

## 2020-06-11 LAB — HEPATITIS C ANTIBODY
Hepatitis C Ab: NONREACTIVE
SIGNAL TO CUT-OFF: 0 (ref <1.00)

## 2020-06-11 MED ORDER — SITAGLIPTIN PHOSPHATE 25 MG PO TABS: 25.0000 mg | ORAL_TABLET | Freq: Every day | ORAL | 1 refills | Status: DC

## 2020-08-26 LAB — HM DIABETES EYE EXAM

## 2020-12-04 ENCOUNTER — Ambulatory Visit (INDEPENDENT_AMBULATORY_CARE_PROVIDER_SITE_OTHER): Payer: Medicare PPO

## 2020-12-04 DIAGNOSIS — Z0000 Encounter for general adult medical examination without abnormal findings: Secondary | ICD-10-CM | POA: Diagnosis not present

## 2020-12-04 DIAGNOSIS — Z780 Asymptomatic menopausal state: Secondary | ICD-10-CM

## 2020-12-04 NOTE — Progress Notes (Signed)
Subjective:   Renee Young is a 75 y.o. female who presents for Medicare Annual (Subsequent) preventive examination.  Virtual Visit via Telephone Note  I connected with  Renee Young on 12/04/20 at  3:30 PM EDT by telephone and verified that I am speaking with the correct person using two identifiers.  Location: Patient: home Provider: [place] Persons participating in the virtual visit: [place]   I discussed the limitations, risks, security and privacy concerns of performing an evaluation and management service by telephone and the availability of in person appointments. The patient expressed understanding and agreed to proceed.  Interactive audio and video telecommunications were attempted between this nurse and patient, however failed, due to patient having technical difficulties OR patient did not have access to video capability.  We continued and completed visit with audio only.  Some vital signs may be absent or patient reported.   Clemetine Marker, LPN   Review of Systems     Cardiac Risk Factors include: advanced age (>61mn, >>69women);diabetes mellitus;dyslipidemia     Objective:    There were no vitals filed for this visit. There is no height or weight on file to calculate BMI.  Advanced Directives 12/04/2020 12/04/2019 10/08/2019 09/26/2019 12/24/2016 03/18/2016 06/25/2014  Does Patient Have a Medical Advance Directive? No No No No No No No  Would patient like information on creating a medical advance directive? Yes (MAU/Ambulatory/Procedural Areas - Information given) Yes (MAU/Ambulatory/Procedural Areas - Information given) No - Patient declined - Yes (MAU/Ambulatory/Procedural Areas - Information given) - No - patient declined information    Current Medications (verified) Outpatient Encounter Medications as of 12/04/2020  Medication Sig   ACCU-CHEK GUIDE test strip    Accu-Chek Softclix Lancets lancets    amphetamine-dextroamphetamine (ADDERALL) 20  MG tablet Take 20 mg by mouth daily.    blood glucose meter kit and supplies KIT Dispense based on patient and insurance preference. Use up to four times daily as directed. (FOR ICD-9 250.00, 250.01).   cholecalciferol (VITAMIN D) 1000 units tablet Take 1,000 Units by mouth daily.   Echinacea 125 MG CAPS Take by mouth.   FLUoxetine (PROZAC) 20 MG capsule Take 40 mg by mouth at bedtime.    Red Yeast Rice Extract (RED YEAST RICE PO) Take by mouth.   sitaGLIPtin (JANUVIA) 25 MG tablet Take 1 tablet (25 mg total) by mouth daily.   [DISCONTINUED] Accu-Chek Softclix Lancets lancets Accu-Chek Softclix Lancets   No facility-administered encounter medications on file as of 12/04/2020.    Allergies (verified) Nitrofurantoin and Penicillins   History: Past Medical History:  Diagnosis Date   ADD (attention deficit disorder)    Allergy    Anemia    Arthritis    Chicken pox    Colon polyps    Depression    Diabetes mellitus without complication (HCC)    GERD (gastroesophageal reflux disease)    Kidney stones    Phlebitis    UTI (urinary tract infection)    UTI (urinary tract infection)    Past Surgical History:  Procedure Laterality Date   CESAREAN SECTION     TOTAL KNEE ARTHROPLASTY Left 10/08/2019   Procedure: TOTAL KNEE ARTHROPLASTY;  Surgeon: AGaynelle Arabian MD;  Location: [facility];  Service: Orthopedics;  Laterality: Left;   TUBAL LIGATION     URETEROCELE INCISION     VASCULAR SURGERY     varicose vein laser surgery   Family History  Problem Relation Age of Onset   Cancer Mother  Kidney disease Mother    Diabetes Father    Heart disease Father    Hyperlipidemia Father    Hypertension Father    Stroke Father    Arthritis Sister    Obesity Brother    Arthritis Brother    Heart disease Maternal Grandmother    Stroke Maternal Grandmother    Hypertension Maternal Grandmother    Heart disease Maternal Grandfather    Hyperlipidemia Maternal Grandfather    Stroke Paternal  Grandmother    Heart disease Paternal Grandmother    Heart disease Paternal Grandfather    Heart attack Paternal Grandfather    Arthritis Daughter    Social History   Socioeconomic History   Marital status: Married    Spouse name: Not on file   Number of children: Not on file   Years of education: Not on file   Highest education level: Not on file  Occupational History   Not on file  Tobacco Use   Smoking status: Never   Smokeless tobacco: Never  Vaping Use   Vaping Use: Never used  Substance and Sexual Activity   Alcohol use: Yes    Alcohol/week: 2.0 standard drinks    Types: 2 Glasses of wine per week    Comment: occas.   Drug use: No   Sexual activity: Yes  Other Topics Concern   Not on file  Social History Narrative   Married   Retired Programmer, applications   Social Determinants of Radio broadcast assistant Strain: Low Risk    Difficulty of Paying Living Expenses: Not hard at all  Food Insecurity: No Food Insecurity   Worried About Charity fundraiser in the Last Year: Never true   Arboriculturist in the Last Year: Never true  Transportation Needs: No Transportation Needs   Lack of Transportation (Medical): No   Lack of Transportation (Non-Medical): No  Physical Activity: Insufficiently Active   Days of Exercise per Week: 3 days   Minutes of Exercise per Session: 30 min  Stress: No Stress Concern Present   Feeling of Stress : Not at all  Social Connections: Moderately Isolated   Frequency of Communication with Friends and Family: More than three times a week   Frequency of Social Gatherings with Friends and Family: More than three times a week   Attends Religious Services: Never   Marine scientist or Organizations: No   Attends Music therapist: Never   Marital Status: Married    Tobacco Counseling Counseling given: Not Answered   Clinical Intake:  Pre-visit preparation completed: Yes  Pain : No/denies pain     Nutritional  Risks: None Diabetes: Yes CBG done?: No Did pt. bring in CBG monitor from home?: No  How often do you need to have someone help you when you read instructions, pamphlets, or other written materials from your doctor or pharmacy?: 1 - Never  Nutrition Risk Assessment:  Has the patient had any N/V/D within the last 2 months?  No  Does the patient have any non-healing wounds?  No  Has the patient had any unintentional weight loss or weight gain?  No   Diabetes:  Is the patient diabetic?  Yes  If diabetic, was a CBG obtained today?  No  Did the patient bring in their glucometer from home?  No  How often do you monitor your CBG's? Several times per week .   Financial Strains and Diabetes Management:  Are you having  any financial strains with the device, your supplies or your medication? No .  Does the patient want to be seen by Chronic Care Management for management of their diabetes?  No  Would the patient like to be referred to a Nutritionist or for Diabetic Management?  No   Diabetic Exams:  Diabetic Eye Exam: Completed 08/26/20.   Diabetic Foot Exam: Completed 06/10/20.   Interpreter Needed?: No  Information entered by :: Clemetine Marker LPN   Activities of Daily Living In your present state of health, do you have any difficulty performing the following activities: 12/04/2020 06/10/2020  Hearing? N Y  Vision? N N  Difficulty concentrating or making decisions? N N  Walking or climbing stairs? N N  Dressing or bathing? N N  Doing errands, shopping? N N  Preparing Food and eating ? N -  Using the Toilet? N -  In the past six months, have you accidently leaked urine? N -  Do you have problems with loss of bowel control? N -  Managing your Medications? N -  Managing your Finances? N -  Housekeeping or managing your Housekeeping? N -  Some recent data might be hidden    Patient Care Team: Delsa Grana, PA-C as PCP - General (Family Medicine) Laurence Aly, OD  (Optometry) [place] any recent Medical Services you may have received from other than [facility] providers in the past year (date may be approximate).     Assessment:   This is a routine wellness examination for Daviona.  Hearing/Vision screen Hearing Screening - Comments:: Pt denies hearing difficulty Vision Screening - Comments:: Annual vision screening done by Dr. Ronnald Ramp at [facility] in [place]  Dietary issues and exercise activities discussed: Current Exercise Habits: Home exercise routine, Type of exercise: walking, Time (Minutes): 30, Frequency (Times/Week): 3, Weekly Exercise (Minutes/Week): 90, Intensity: Mild, Exercise limited by: orthopedic condition(s)   Goals Addressed             This Visit's Progress    patient stated       Patient states she would like to maintain current health and activity level        Depression Screen PHQ 2/9 Scores 12/04/2020 06/10/2020 01/29/2020 12/04/2019 11/28/2019 09/25/2019 05/30/2019  PHQ - 2 Score 0 0 0 0 1 0 0  PHQ- 9 Score - 0 1 - 6 0 0    Fall Risk Fall Risk  12/04/2020 06/10/2020 12/04/2019 11/28/2019 09/25/2019  Falls in the past year? 0 '1 1 1 1  ' Comment - - - - -  Number falls in past yr: 0 0 '1 1 1  ' Comment - - - - -  Injury with Fall? 0 0 0 0 0  Comment - - - - -  Risk for fall due to : No Fall Risks - History of fall(s) - -  Follow up Falls prevention discussed - Falls prevention discussed - -    FALL RISK PREVENTION PERTAINING TO THE HOME:  Any stairs in or around the home? Yes  If so, are there any without handrails? No  Home free of loose throw rugs in walkways, pet beds, electrical cords, etc? Yes  Adequate lighting in your home to reduce risk of falls? Yes   ASSISTIVE DEVICES UTILIZED TO PREVENT FALLS:  Life alert? No  Use of a cane, walker or w/c? No  Grab bars in the bathroom? Yes  Shower chair or bench in shower? Yes  Elevated toilet seat or  a handicapped  toilet? No   TIMED UP AND GO:  Was the test performed? No . Telephonic visit  Cognitive Function: Normal cognitive status assessed by direct observation by this Nurse Health Advisor. No abnormalities found.       6CIT Screen 12/04/2019 12/24/2016  What Year? 0 points 0 points  What month? 0 points 0 points  What time? 0 points 0 points  Count back from 20 0 points 0 points  Months in reverse 0 points 0 points  Repeat phrase 0 points 2 points  Total Score 0 2    Immunizations Immunization History  Administered Date(s) Administered   Influenza, High Dose Seasonal PF 12/06/2017   Influenza, Seasonal, Injecte, Preservative Fre 04/05/2012   Influenza,inj,Quad PF,6+ Mos 01/07/2014, 01/06/2015, 12/24/2016   Moderna Sars-Covid-2 Vaccination 04/11/2019, 05/09/2019   Pneumococcal Conjugate-13 01/07/2014   Pneumococcal Polysaccharide-23 04/27/2012   Tdap 04/27/2012   Zoster, Live 10/03/2007    TDAP status: Up to date  Flu Vaccine status: Due, Education has been provided regarding the importance of this vaccine. Advised may receive this vaccine at local pharmacy or Health Dept. Aware to provide a copy of the vaccination record if obtained from local pharmacy or Health Dept. Verbalized acceptance and understanding.  Pneumococcal vaccine status: Up to date  Covid-19 vaccine status: Completed vaccines  Qualifies for Shingles Vaccine? Yes   Zostavax completed Yes   Shingrix Completed?: No.    Education has been provided regarding the importance of this vaccine. Patient has been advised to call insurance company to determine out of pocket expense if they have not yet received this vaccine. Advised may also receive vaccine at local pharmacy or Health Dept. Verbalized acceptance and understanding.  Screening Tests Health Maintenance  Topic Date Due   Zoster Vaccines- Shingrix (1 of 2) Never done   DEXA SCAN  Never done   COVID-19 Vaccine (3 - Booster for Moderna series) 07/04/2019    INFLUENZA VACCINE  09/15/2020   URINE MICROALBUMIN  09/24/2020   COLONOSCOPY (Pts 45-22yr Insurance coverage will need to be confirmed)  06/10/2021 (Originally 12/28/2018)   HEMOGLOBIN A1C  12/10/2020   FOOT EXAM  06/10/2021   OPHTHALMOLOGY EXAM  08/26/2021   TETANUS/TDAP  04/28/2022   MAMMOGRAM  04/30/2022   Pneumonia Vaccine 75 Years old  Completed   Hepatitis C Screening  Completed   HPV VACCINES  Aged Out    Health Maintenance  Health Maintenance Due  Topic Date Due   Zoster Vaccines- Shingrix (1 of 2) Never done   DEXA SCAN  Never done   COVID-19 Vaccine (3 - Booster for Moderna series) 07/04/2019   INFLUENZA VACCINE  09/15/2020   URINE MICROALBUMIN  09/24/2020    Colorectal cancer screening: Type of screening: Colonoscopy. Completed 12/27/13. Repeat every 5 years. Pt declines repeat screening at this time.   Mammogram status: Completed 04/29/20. Repeat every year  Bone Density status: Ordered today. Pt provided with contact info and advised to call to schedule appt.  Lung Cancer Screening: (Low Dose CT Chest recommended if Age 74-80years, 30 pack-year currently smoking OR have quit w/in 15years.) does not qualify.   Additional Screening:  Hepatitis C Screening: does qualify; Completed 06/10/20  Vision Screening: Recommended annual ophthalmology exams for early detection of glaucoma and other disorders of the eye. Is the patient up to date with their annual eye exam?  Yes  Who is the provider or what is the name of the office in which the patient attends annual eye  exams? [facility].   Dental Screening: Recommended annual dental exams for proper oral hygiene  Community Resource Referral / Chronic Care Management: CRR required this visit?  No   CCM required this visit?  No      Plan:     I have personally reviewed and noted the following in the patient's chart:   Medical and social history Use of alcohol, tobacco or illicit drugs  Current medications and  supplements including opioid prescriptions.  Functional ability and status Nutritional status Physical activity Advanced directives List of other physicians Hospitalizations, surgeries, and ER visits in previous 12 months Vitals Screenings to include cognitive, depression, and falls Referrals and appointments  In addition, I have reviewed and discussed with patient certain preventive protocols, quality metrics, and best practice recommendations. A written personalized care plan for preventive services as well as general preventive health recommendations were provided to patient.     Clemetine Marker, LPN   [id number]   Nurse Notes: none

## 2020-12-04 NOTE — Patient Instructions (Signed)
Renee Young , Thank you for taking time to come for your Medicare Wellness Visit. I appreciate your ongoing commitment to your health goals. Please review the following plan we discussed and let me know if I can assist you in the future.   Screening recommendations/referrals: Colonoscopy: done 2015. Declined repeat screening.  Mammogram: done 04/29/20 Bone Density: Please call [phone] to schedule your bone density screening.  Recommended yearly ophthalmology/optometry visit for glaucoma screening and checkup Recommended yearly dental visit for hygiene and checkup  Vaccinations: Influenza vaccine: due Pneumococcal vaccine: done 01/07/14 Tdap vaccine: done 04/27/12 Shingles vaccine: Shingrix discussed. Please contact your pharmacy for coverage information.  Covid-19: done 04/11/19 & 05/09/19. Please bring a copy of your vaccine record to your next appt for booster information.   Advanced directives: Advance directive discussed with you today. I have provided a copy for you to complete at home and have notarized. Once this is complete please bring a copy in to our office so we can scan it into your chart.   Conditions/risks identified: Keep up the great work!   Next appointment: Follow up in one year for your annual wellness visit    Preventive Care 65 Years and Older, Female Preventive care refers to lifestyle choices and visits with your health care provider that can promote health and wellness. What does preventive care include? A yearly physical exam. This is also called an annual well check. Dental exams once or twice a year. Routine eye exams. Ask your health care provider how often you should have your eyes checked. Personal lifestyle choices, including: Daily care of your teeth and gums. Regular physical activity. Eating a healthy diet. Avoiding tobacco and drug use. Limiting alcohol use. Practicing safe sex. Taking low-dose aspirin every day. Taking vitamin and mineral  supplements as recommended by your health care provider. What happens during an annual well check? The services and screenings done by your health care provider during your annual well check will depend on your age, overall health, lifestyle risk factors, and family history of disease. Counseling  Your health care provider may ask you questions about your: Alcohol use. Tobacco use. Drug use. Emotional well-being. Home and relationship well-being. Sexual activity. Eating habits. History of falls. Memory and ability to understand (cognition). Work and work Astronomer. Reproductive health. Screening  You may have the following tests or measurements: Height, weight, and BMI. Blood pressure. Lipid and cholesterol levels. These may be checked every 5 years, or more frequently if you are over 22 years old. Skin check. Lung cancer screening. You may have this screening every year starting at age 74 if you have a 30-pack-year history of smoking and currently smoke or have quit within the past 15 years. Fecal occult blood test (FOBT) of the stool. You may have this test every year starting at age 2. Flexible sigmoidoscopy or colonoscopy. You may have a sigmoidoscopy every 5 years or a colonoscopy every 10 years starting at age 28. Hepatitis C blood test. Hepatitis B blood test. Sexually transmitted disease (STD) testing. Diabetes screening. This is done by checking your blood sugar (glucose) after you have not eaten for a while (fasting). You may have this done every 1-3 years. Bone density scan. This is done to screen for osteoporosis. You may have this done starting at age 79. Mammogram. This may be done every 1-2 years. Talk to your health care provider about how often you should have regular mammograms. Talk with your health care provider about your test results, treatment  options, and if necessary, the need for more tests. Vaccines  Your health care provider may recommend certain  vaccines, such as: Influenza vaccine. This is recommended every year. Tetanus, diphtheria, and acellular pertussis (Tdap, Td) vaccine. You may need a Td booster every 10 years. Zoster vaccine. You may need this after age 59. Pneumococcal 13-valent conjugate (PCV13) vaccine. One dose is recommended after age 43. Pneumococcal polysaccharide (PPSV23) vaccine. One dose is recommended after age 53. Talk to your health care provider about which screenings and vaccines you need and how often you need them. This information is not intended to replace advice given to you by your health care provider. Make sure you discuss any questions you have with your health care provider. Document Released: 02/28/2015 Document Revised: 10/22/2015 Document Reviewed: 12/03/2014 Elsevier Interactive Patient Education  2017 [place] Prevention in the Home Falls can cause injuries. They can happen to people of all ages. There are many things you can do to make your home safe and to help prevent falls. What can I do on the outside of my home? Regularly fix the edges of walkways and driveways and fix any cracks. Remove anything that might make you trip as you walk through a door, such as a raised step or threshold. Trim any bushes or trees on the path to your home. Use bright outdoor lighting. Clear any walking paths of anything that might make someone trip, such as rocks or tools. Regularly check to see if handrails are loose or broken. Make sure that both sides of any steps have handrails. Any raised decks and porches should have guardrails on the edges. Have any leaves, snow, or ice cleared regularly. Use sand or salt on walking paths during winter. Clean up any spills in your garage right away. This includes oil or grease spills. What can I do in the bathroom? Use night lights. Install grab bars by the toilet and in the tub and shower. Do not use towel bars as grab bars. Use non-skid mats or decals in  the tub or shower. If you need to sit down in the shower, use a plastic, non-slip stool. Keep the floor dry. Clean up any water that spills on the floor as soon as it happens. Remove soap buildup in the tub or shower regularly. Attach bath mats securely with double-sided non-slip rug tape. Do not have throw rugs and other things on the floor that can make you trip. What can I do in the bedroom? Use night lights. Make sure that you have a light by your bed that is easy to reach. Do not use any sheets or blankets that are too big for your bed. They should not hang down onto the floor. Have a firm chair that has side arms. You can use this for support while you get dressed. Do not have throw rugs and other things on the floor that can make you trip. What can I do in the kitchen? Clean up any spills right away. Avoid walking on wet floors. Keep items that you use a lot in easy-to-reach places. If you need to reach something above you, use a strong step stool that has a grab bar. Keep electrical cords out of the way. Do not use floor polish or wax that makes floors slippery. If you must use wax, use non-skid floor wax. Do not have throw rugs and other things on the floor that can make you trip. What can I do with my stairs? Do not  leave any items on the stairs. Make sure that there are handrails on both sides of the stairs and use them. Fix handrails that are broken or loose. Make sure that handrails are as long as the stairways. Check any carpeting to make sure that it is firmly attached to the stairs. Fix any carpet that is loose or worn. Avoid having throw rugs at the top or bottom of the stairs. If you do have throw rugs, attach them to the floor with carpet tape. Make sure that you have a light switch at the top of the stairs and the bottom of the stairs. If you do not have them, ask someone to add them for you. What else can I do to help prevent falls? Wear shoes that: Do not have high  heels. Have rubber bottoms. Are comfortable and fit you well. Are closed at the toe. Do not wear sandals. If you use a stepladder: Make sure that it is fully opened. Do not climb a closed stepladder. Make sure that both sides of the stepladder are locked into place. Ask someone to hold it for you, if possible. Clearly mark and make sure that you can see: Any grab bars or handrails. First and last steps. Where the edge of each step is. Use tools that help you move around (mobility aids) if they are needed. These include: Canes. Walkers. Scooters. Crutches. Turn on the lights when you go into a dark area. Replace any light bulbs as soon as they burn out. Set up your furniture so you have a clear path. Avoid moving your furniture around. If any of your floors are uneven, fix them. If there are any pets around you, be aware of where they are. Review your medicines with your doctor. Some medicines can make you feel dizzy. This can increase your chance of falling. Ask your doctor what other things that you can do to help prevent falls. This information is not intended to replace advice given to you by your health care provider. Make sure you discuss any questions you have with your health care provider. Document Released: 11/28/2008 Document Revised: 07/10/2015 Document Reviewed: 03/08/2014 Elsevier Interactive Patient Education  2017 [employer].

## 2020-12-15 ENCOUNTER — Encounter: Payer: Self-pay | Admitting: Family Medicine

## 2020-12-15 ENCOUNTER — Other Ambulatory Visit: Payer: Self-pay

## 2020-12-15 ENCOUNTER — Ambulatory Visit: Payer: Medicare PPO | Admitting: Family Medicine

## 2020-12-15 VITALS — BP 116/64 | HR 91 | Temp 97.7°F | Resp 18 | Ht 65.0 in | Wt 168.6 lb

## 2020-12-15 DIAGNOSIS — Z8349 Family history of other endocrine, nutritional and metabolic diseases: Secondary | ICD-10-CM

## 2020-12-15 DIAGNOSIS — E1165 Type 2 diabetes mellitus with hyperglycemia: Secondary | ICD-10-CM | POA: Diagnosis not present

## 2020-12-15 DIAGNOSIS — Z5181 Encounter for therapeutic drug level monitoring: Secondary | ICD-10-CM

## 2020-12-15 DIAGNOSIS — E782 Mixed hyperlipidemia: Secondary | ICD-10-CM

## 2020-12-15 DIAGNOSIS — F988 Other specified behavioral and emotional disorders with onset usually occurring in childhood and adolescence: Secondary | ICD-10-CM | POA: Diagnosis not present

## 2020-12-15 DIAGNOSIS — F325 Major depressive disorder, single episode, in full remission: Secondary | ICD-10-CM

## 2020-12-15 NOTE — Progress Notes (Signed)
Name: Renee Young   MRN: [id number]    DOB: 05-05-1945   Date:12/15/2020       Progress Note  Chief Complaint  Patient presents with   Follow-up   Diabetes   Hypertension   Hyperlipidemia    Subjective:   Renee Young is a 75 y.o. 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 who recently moved out of the are    DM:   Pt managing DM with januvia Labs improved and diet/lifestyle stable/no changes since April  Pt has no SE from meds Blood sugars - checks a couple times a month 112 high and normally 98 Denies: Polyuria, polydipsia, vision changes, neuropathy, hypoglycemia Recent pertinent labs: Lab Results  Component Value Date   HGBA1C 5.8 (H) 06/10/2020   HGBA1C 7.3 (H) 09/25/2019   HGBA1C 7.1 (H) 05/30/2019   Standard of care and health maintenance: Foot exam: Done in April DM eye exam: done  ACEI/ARB: Not taking Statin:  Not taking  Hypertension:  Managed with diet/exercise, improved BP Readings from Last 3 Encounters:  12/15/20 116/64  06/10/20 124/72  01/29/20 (!) 154/50  Pt denies CP, SOB, exertional sx, LE edema, palpitation, Ha's, visual disturbances, lightheadedness, hypotension, syncope.  Hyperlipidemia: Not on statin - absolutely opposed to taking it- tried it in the past  Last Lipids: Lab Results  Component Value Date   CHOL 293 (H) 06/10/2020   HDL 50 06/10/2020   LDLCALC 218 (H) 06/10/2020   LDLDIRECT 222.0 03/07/2009   TRIG 116 06/10/2020   CHOLHDL 5.9 (H) 06/10/2020   - Denies: Chest pain, shortness of breath, myalgias, claudication Reviewed medications and ASCVD risk score The 10-year ASCVD risk score (Arnett DK, et al., 2019) is: 32.7%   Values used to calculate the score:     Age: 75 years     Sex: Female     Is Non-Hispanic African American: No     Diabetic: Yes      Tobacco smoker: Yes     Systolic Blood Pressure: 409 mmHg     Is BP treated: No     HDL Cholesterol: 50 mg/dL     Total Cholesterol: 293 mg/dL   Goes to a psychiatrist in [place] -on Prozac 20 mg, states mood and symptoms are well controlled and stable Depression screen [facility] 2/9 12/15/2020 12/04/2020 06/10/2020  Decreased Interest 0 0 0  Down, Depressed, Hopeless 0 0 0  PHQ - 2 Score 0 0 0  Altered sleeping 0 - 0  Tired, decreased energy 0 - 0  Change in appetite 0 - 0  Feeling bad or failure about yourself  0 - 0  Trouble concentrating 0 - 0  Moving slowly or fidgety/restless 0 - 0  Suicidal thoughts 0 - 0  PHQ-9 Score 0 - 0  Difficult doing work/chores Not difficult at all - Not difficult at all  Some recent data might be hidden   GAD 7 : Generalized Anxiety Score 11/28/2019  Nervous, Anxious, on Edge 0  Control/stop worrying 0  Worry too much - different things 1  Trouble relaxing 1  Easily annoyed or irritable 1  Afraid - awful might happen 0  Anxiety Difficulty Somewhat difficult   ADHD on adderall per specialist -patient has been on for many years and decades, reports her mood is stable, blood pressure  and heart rate reviewed        Current Outpatient Medications:    ACCU-CHEK GUIDE test strip, , Disp: , Rfl:    Accu-Chek Softclix Lancets lancets, , Disp: , Rfl:    amphetamine-dextroamphetamine (ADDERALL) 20 MG tablet, Take 20 mg by mouth daily. , Disp: , Rfl: 0   blood glucose meter kit and supplies KIT, Dispense based on patient and insurance preference. Use up to four times daily as directed. (FOR ICD-9 250.00, 250.01)., Disp: 1 each, Rfl: 0   cholecalciferol (VITAMIN D) 1000 units tablet, Take 1,000 Units by mouth daily., Disp: , Rfl:    Echinacea 125 MG CAPS, Take by mouth., Disp: , Rfl:    FLUoxetine (PROZAC) 20 MG capsule, Take 40 mg by mouth at bedtime. , Disp: , Rfl:    Red Yeast Rice Extract (RED YEAST RICE PO), Take by mouth., Disp: , Rfl:     sitaGLIPtin (JANUVIA) 25 MG tablet, Take 1 tablet (25 mg total) by mouth daily., Disp: 90 tablet, Rfl: 1  Patient Active Problem List   Diagnosis Date Noted   S/P total knee arthroplasty 10/08/2019   Tachycardia 09/25/2019   Hypochromic erythrocytes 09/24/2019   Microcytic erythrocytes 09/24/2019   Prediabetes 05/30/2019   Family history of thyroid disease 05/30/2019   Primary osteoarthritis of both knees 05/30/2019   Bilateral temporomandibular joint pain 05/30/2019   History of colonic polyps 12/17/2016   Varicose veins of bilateral lower extremities with other complications 94/17/4081   Pain in limb 07/03/2012   OA (osteoarthritis) of knee 03/21/2009   Type 2 diabetes mellitus with hyperglycemia, without long-term current use of insulin ([place]) 07/19/2007   Mixed hyperlipidemia 07/19/2007   DEPRESSION 07/19/2007   ATTENTION DEFICIT DISORDER 07/19/2007    Past Surgical History:  Procedure Laterality Date   CESAREAN SECTION     TOTAL KNEE ARTHROPLASTY Left 10/08/2019   Procedure: TOTAL KNEE ARTHROPLASTY;  Surgeon: Gaynelle Arabian, MD;  Location: [facility];  Service: Orthopedics;  Laterality: Left;   TUBAL LIGATION     URETEROCELE INCISION     VASCULAR SURGERY     varicose vein laser surgery    Family History  Problem Relation Age of Onset   Cancer Mother    Kidney disease Mother    Diabetes Father    Heart disease Father    Hyperlipidemia Father    Hypertension Father    Stroke Father    Arthritis Sister    Obesity Brother    Arthritis Brother    Heart disease Maternal Grandmother    Stroke Maternal Grandmother    Hypertension Maternal Grandmother    Heart disease Maternal Grandfather    Hyperlipidemia Maternal Grandfather    Stroke Paternal Grandmother    Heart disease Paternal Grandmother    Heart disease Paternal Grandfather    Heart attack Paternal Grandfather    Arthritis Daughter     Social History   Tobacco Use   Smoking status: Never   Smokeless tobacco:  Never  Vaping Use   Vaping Use: Never used  Substance Use Topics   Alcohol use: Yes    Alcohol/week: 2.0 standard drinks    Types: 2 Glasses of wine per week    Comment: occas.   Drug use: No     Allergies  Allergen Reactions   Nitrofurantoin Hives and Swelling   Penicillins Swelling    Tolerated Cephalosporin Date: 10/09/19.      Health Maintenance  Topic Date Due   Zoster Vaccines- Shingrix (1  of 2) Never done   DEXA SCAN  Never done   COVID-19 Vaccine (3 - Booster for Moderna series) 07/04/2019   URINE MICROALBUMIN  09/24/2020   HEMOGLOBIN A1C  12/10/2020   INFLUENZA VACCINE  05/15/2021 (Originally 09/15/2020)   COLONOSCOPY (Pts 45-32yr Insurance coverage will need to be confirmed)  06/10/2021 (Originally 12/28/2018)   FOOT EXAM  06/10/2021   OPHTHALMOLOGY EXAM  08/26/2021   TETANUS/TDAP  04/28/2022   MAMMOGRAM  04/30/2022   Pneumonia Vaccine 75 Years old  Completed   Hepatitis C Screening  Completed   HPV VACCINES  Aged Out    Chart Review Today: I personally reviewed active problem list, medication list, allergies, family history, social history, health maintenance, notes from last encounter, lab results, imaging with the patient/caregiver today.   Review of Systems  Constitutional: Negative.   HENT: Negative.    Eyes: Negative.   Respiratory: Negative.    Cardiovascular: Negative.   Gastrointestinal: Negative.   Endocrine: Negative.   Genitourinary: Negative.   Musculoskeletal: Negative.   Skin: Negative.   Allergic/Immunologic: Negative.   Neurological: Negative.   Hematological: Negative.   Psychiatric/Behavioral: Negative.    All other systems reviewed and are negative.   Objective:   Vitals:   12/15/20 0903  BP: 116/64  Pulse: 91  Resp: 18  Temp: 97.7 F (36.5 C)  TempSrc: Oral  SpO2: 94%  Weight: 168 lb 9.6 oz (76.5 kg)  Height: '5\' 5"'  (1.651 m)    Body mass index is 28.06 kg/m.  Physical Exam Vitals and nursing note reviewed.   Constitutional:      General: She is not in acute distress.    Appearance: Normal appearance. She is normal weight. She is not ill-appearing, toxic-appearing or diaphoretic.  HENT:     Head: Normocephalic and atraumatic.     Right Ear: External ear normal.     Left Ear: External ear normal.  Eyes:     General:        Right eye: No discharge.        Left eye: No discharge.     Conjunctiva/sclera: Conjunctivae normal.  Cardiovascular:     Rate and Rhythm: Normal rate and regular rhythm.     Pulses: Normal pulses.     Heart sounds: Normal heart sounds. No murmur heard.   No friction rub.  Pulmonary:     Effort: Pulmonary effort is normal. No respiratory distress.     Breath sounds: Normal breath sounds. No wheezing or rales.  Abdominal:     General: Bowel sounds are normal.     Palpations: Abdomen is soft.  Musculoskeletal:     Right lower leg: No edema.     Left lower leg: No edema.  Skin:    General: Skin is warm and dry.     Capillary Refill: Capillary refill takes less than 2 seconds.     Coloration: Skin is not jaundiced or pale.     Findings: No rash.  Neurological:     Mental Status: She is alert. Mental status is at baseline.     Gait: Gait normal.  Psychiatric:        Mood and Affect: Mood normal.        Behavior: Behavior normal.        Assessment & Plan:     ICD-10-CM   1. Type 2 diabetes mellitus with hyperglycemia, without long-term current use of insulin (HCC)  E11.65 Hemoglobin A1C    COMPLETE METABOLIC PANEL WITH  GFR    Microalbumin, urine   Has been stable and well-controlled with only Januvia    2. Mixed hyperlipidemia  E78.2 Hemoglobin A1C    COMPLETE METABOLIC PANEL WITH GFR   Not on medication, refuses statin, discussed ASCVD risk scorem MOA of statins, benefit vs risks, encouraged pt to consider in addition to diet/lifestyle    3. Attention deficit disorder (ADD) without hyperactivity  F98.8    Reviewed medication, managed by specialist     4. Family history of thyroid disease  Z83.49 TSH    TSH    5. Encounter for medication monitoring  Z51.81 Hemoglobin A1C    COMPLETE METABOLIC PANEL WITH GFR    TSH    6. Major depressive disorder in remission, unspecified whether recurrent ([place])  F32.5    PHQ-9 reviewed and negative today, on Prozac 20 mg, managed by psychiatry in [place] from Last 5 Encounters:  12/15/20 168 lb 9.6 oz (76.5 kg)  06/10/20 164 lb 14.4 oz (74.8 kg)  01/29/20 166 lb 4.8 oz (75.4 kg)  11/28/19 173 lb 3.2 oz (78.6 kg)  10/08/19 177 lb 4.8 oz (80.4 kg)   BMI Readings from Last 5 Encounters:  12/15/20 28.06 kg/m  06/10/20 27.44 kg/m  01/29/20 27.67 kg/m  11/28/19 28.82 kg/m  10/08/19 29.50 kg/m     Return in about 6 months (around 06/14/2021) for Routine follow-up.   Delsa Grana, PA-C 12/15/20 9:17 AM

## 2020-12-15 NOTE — Patient Instructions (Signed)
[facility] [address] [place],  [place]  [address] Get Driving Directions Main: [phone]  Call to schedule bone density  Health Maintenance  Topic Date Due   Zoster (Shingles) Vaccine (1 of 2) Never done   DEXA scan (bone density measurement)  Never done   COVID-19 Vaccine (3 - Booster for Moderna series) 07/04/2019   Urine Protein Check  09/24/2020   Hemoglobin A1C  12/10/2020   Flu Shot  05/15/2021*   Colon Cancer Screening  06/10/2021*   Mammogram  04/29/2021   Complete foot exam   06/10/2021   Eye exam for diabetics  08/26/2021   Tetanus Vaccine  04/28/2022   Pneumonia Vaccine  Completed   Hepatitis C Screening: USPSTF Recommendation to screen - Ages 18-79 yo.  Completed   HPV Vaccine  Aged Out  *Topic was postponed. The date shown is not the original due date.

## 2020-12-16 LAB — MICROALBUMIN, URINE: Microalb, Ur: 2.1 mg/dL

## 2020-12-16 LAB — HEMOGLOBIN A1C
Hgb A1c MFr Bld: 5.9 % of total Hgb — ABNORMAL HIGH (ref <5.7)
Mean Plasma Glucose: 123 mg/dL
eAG (mmol/L): 6.8 mmol/L

## 2020-12-16 LAB — COMPLETE METABOLIC PANEL WITH GFR
AG Ratio: 1.3 (calc) (ref 1.0–2.5)
ALT: 12 U/L (ref 6–29)
AST: 14 U/L (ref 10–35)
Albumin: 4.1 g/dL (ref 3.6–5.1)
Alkaline phosphatase (APISO): 87 U/L (ref 37–153)
BUN: 19 mg/dL (ref 7–25)
CO2: 28 mmol/L (ref 20–32)
Calcium: 9.7 mg/dL (ref 8.6–10.4)
Chloride: 106 mmol/L (ref 98–110)
Creat: 0.76 mg/dL (ref 0.60–1.00)
Globulin: 3.2 g/dL (calc) (ref 1.9–3.7)
Glucose, Bld: 98 mg/dL (ref 65–99)
Potassium: 4.6 mmol/L (ref 3.5–5.3)
Sodium: 141 mmol/L (ref 135–146)
Total Bilirubin: 0.4 mg/dL (ref 0.2–1.2)
Total Protein: 7.3 g/dL (ref 6.1–8.1)
eGFR: 82 mL/min/{1.73_m2} (ref >=60)

## 2020-12-16 LAB — TSH: TSH: 3.37 mIU/L (ref 0.40–4.50)

## 2021-01-02 ENCOUNTER — Other Ambulatory Visit: Payer: Self-pay | Admitting: Family Medicine

## 2021-01-02 DIAGNOSIS — E1165 Type 2 diabetes mellitus with hyperglycemia: Secondary | ICD-10-CM

## 2021-01-02 NOTE — Telephone Encounter (Signed)
Requested Prescriptions  Pending Prescriptions Disp Refills  . JANUVIA 25 MG tablet [Pharmacy Med Name: JANUVIA 25 MG TABLET] 90 tablet 1    Sig: TAKE 1 TABLET (25 MG TOTAL) BY MOUTH DAILY.     Endocrinology:  Diabetes - DPP-4 Inhibitors Passed - 01/02/2021  1:32 AM      Passed - HBA1C is between 0 and 7.9 and within 180 days    Hgb A1c MFr Bld  Date Value Ref Range Status  12/15/2020 5.9 (H) <5.7 % of total Hgb Final    Comment:    For someone without known diabetes, a hemoglobin  A1c value between 5.7% and 6.4% is consistent with prediabetes and should be confirmed with a  follow-up test. . For someone with known diabetes, a value <7% indicates that their diabetes is well controlled. A1c targets should be individualized based on duration of diabetes, age, comorbid conditions, and other considerations. . This assay result is consistent with an increased risk of diabetes. . Currently, no consensus exists regarding use of hemoglobin A1c for diagnosis of diabetes for children. .          Passed - Cr in normal range and within 360 days    Creat  Date Value Ref Range Status  12/15/2020 0.76 0.60 - 1.00 mg/dL Final   Creatinine,U  Date Value Ref Range Status  03/07/2009 145.4 mg/dL Final   Creatinine, Urine  Date Value Ref Range Status  09/25/2019 108 20 - 275 mg/dL Final         Passed - Valid encounter within last 6 months    Recent Outpatient Visits          2 weeks ago Mixed hyperlipidemia   [facility] Danelle Berry, PA-C   6 months ago Type 2 diabetes mellitus with hyperglycemia, without long-term current use of insulin [facility])   [facility] [place], Sheliah Mends, PA-C   11 months ago Type 2 diabetes mellitus with hyperglycemia, without long-term current use of insulin [facility])   [facility] Welford Roche D, MD   1 year ago Type 2 diabetes mellitus with hyperglycemia, without long-term current use of insulin  [facility])   [facility] Welford Roche D, MD   1 year ago Type 2 diabetes mellitus with hyperglycemia, without long-term current use of insulin [facility])   [facility] Jamelle Haring, MD      Future Appointments            In 5 months Danelle Berry, PA-C [facility], [facility]   In 11 months  [facility], [facility]

## 2021-01-06 DIAGNOSIS — F325 Major depressive disorder, single episode, in full remission: Secondary | ICD-10-CM | POA: Insufficient documentation

## 2021-01-06 DIAGNOSIS — F3341 Major depressive disorder, recurrent, in partial remission: Secondary | ICD-10-CM | POA: Insufficient documentation

## 2021-01-17 ENCOUNTER — Other Ambulatory Visit: Payer: Self-pay

## 2021-01-17 ENCOUNTER — Ambulatory Visit
Admission: RE | Admit: 2021-01-17 | Discharge: 2021-01-17 | Disposition: A | Discharge status: Home / Self Care | Payer: Medicare PPO | Source: Ambulatory Visit | Attending: Family Medicine | Admitting: Family Medicine

## 2021-01-17 VITALS — BP 142/80 | HR 80 | Temp 98.7°F | Resp 16

## 2021-01-17 DIAGNOSIS — H1132 Conjunctival hemorrhage, left eye: Secondary | ICD-10-CM | POA: Diagnosis not present

## 2021-01-17 NOTE — ED Provider Notes (Signed)
Renee Young    CSN: [id number] Arrival date & time: 01/17/21  1408      History   Chief Complaint Chief Complaint  Patient presents with   Eye Problem    HPI Renee Young is a 75 y.o. female.  Patient presents with redness in her left eye since waking up this morning.  No trauma.  No eye pain or changes in vision.  No fever, chills, eye drainage, or other symptoms.  No treatments at home.  Medical history includes diabetes.  The history is provided by the patient and medical records.   Past Medical History:  Diagnosis Date   ADD (attention deficit disorder)    Allergy    Anemia    Arthritis    Chicken pox    Colon polyps    Depression    Diabetes mellitus without complication (HCC)    GERD (gastroesophageal reflux disease)    Kidney stones    Phlebitis    UTI (urinary tract infection)    UTI (urinary tract infection)     Patient Active Problem List   Diagnosis Date Noted   Major depressive disorder in remission ([place]) 01/06/2021   S/P total knee arthroplasty 10/08/2019   Tachycardia 09/25/2019   Hypochromic erythrocytes 09/24/2019   Microcytic erythrocytes 09/24/2019   Prediabetes 05/30/2019   Family history of thyroid disease 05/30/2019   Primary osteoarthritis of both knees 05/30/2019   Bilateral temporomandibular joint pain 05/30/2019   History of colonic polyps 12/17/2016   Varicose veins of bilateral lower extremities with other complications 15/17/6160   Pain in limb 07/03/2012   OA (osteoarthritis) of knee 03/21/2009   Type 2 diabetes mellitus with hyperglycemia, without long-term current use of insulin ([place]) 07/19/2007   Mixed hyperlipidemia 07/19/2007   DEPRESSION 07/19/2007   ATTENTION DEFICIT DISORDER 07/19/2007    Past Surgical History:  Procedure Laterality Date   CESAREAN SECTION     TOTAL KNEE ARTHROPLASTY Left 10/08/2019   Procedure: TOTAL KNEE ARTHROPLASTY;  Surgeon: Gaynelle Arabian, MD;  Location: [facility];  Service: Orthopedics;   Laterality: Left;   TUBAL LIGATION     URETEROCELE INCISION     VASCULAR SURGERY     varicose vein laser surgery    OB History   No obstetric history on file.      Home Medications    Prior to Admission medications   Medication Sig Start Date End Date Taking? Authorizing Provider  ACCU-CHEK GUIDE test strip  11/28/19   [provider]  Accu-Chek Softclix Lancets lancets  11/28/19   [provider]  amphetamine-dextroamphetamine (ADDERALL) 20 MG tablet Take 20 mg by mouth daily.  03/06/16   [provider]  blood glucose meter kit and supplies KIT Dispense based on patient and insurance preference. Use up to four times daily as directed. (FOR ICD-9 250.00, 250.01). 10/26/19   Towanda Malkin, MD  cholecalciferol (VITAMIN D) 1000 units tablet Take 1,000 Units by mouth daily.    [provider]  Echinacea 125 MG CAPS Take by mouth.    [provider]  FLUoxetine (PROZAC) 20 MG capsule Take 40 mg by mouth at bedtime.     [provider]  JANUVIA 25 MG tablet TAKE 1 TABLET (25 MG TOTAL) BY MOUTH DAILY. 01/02/21   Delsa Grana, PA-C  Red Yeast Rice Extract (RED YEAST RICE PO) Take by mouth.    [provider]    Family History Family History  Problem Relation Age of Onset  Cancer Mother    Kidney disease Mother    Diabetes Father    Heart disease Father    Hyperlipidemia Father    Hypertension Father    Stroke Father    Arthritis Sister    Obesity Brother    Arthritis Brother    Heart disease Maternal Grandmother    Stroke Maternal Grandmother    Hypertension Maternal Grandmother    Heart disease Maternal Grandfather    Hyperlipidemia Maternal Grandfather    Stroke Paternal Grandmother    Heart disease Paternal Grandmother    Heart disease Paternal Grandfather    Heart attack Paternal Grandfather    Arthritis Daughter     Social History Social History   Tobacco Use   Smoking status: Never    Smokeless tobacco: Never  Vaping Use   Vaping Use: Never used  Substance Use Topics   Alcohol use: Yes    Alcohol/week: 2.0 standard drinks    Types: 2 Glasses of wine per week    Comment: occas.   Drug use: No     Allergies   Nitrofurantoin and Penicillins   Review of Systems Review of Systems  Constitutional:  Negative for chills and fever.  HENT:  Negative for ear pain and sore throat.   Eyes:  Positive for redness. Negative for pain, discharge, itching and visual disturbance.  Respiratory:  Negative for cough and shortness of breath.   Cardiovascular:  Negative for chest pain and palpitations.  Gastrointestinal:  Negative for diarrhea and vomiting.  Skin:  Negative for color change and rash.  All other systems reviewed and are negative.   Physical Exam Triage Vital Signs ED Triage Vitals  Enc Vitals Group     BP      Pulse      Resp      Temp      Temp src      SpO2      Weight      Height      Head Circumference      Peak Flow      Pain Score      Pain Loc      Pain Edu?      Excl. in [place]?    No data found.  Updated Vital Signs BP (!) 142/80   Pulse 80   Temp 98.7 F (37.1 C) (Oral)   Resp 16   SpO2 98%   Visual Acuity Right Eye Distance:   Left Eye Distance:   Bilateral Distance:    Right Eye Near:   Left Eye Near:    Bilateral Near:     Physical Exam Vitals and nursing note reviewed.  Constitutional:      General: She is not in acute distress.    Appearance: She is well-developed. She is not ill-appearing.  Eyes:     General: Lids are normal. Vision grossly intact.        Right eye: No discharge.        Left eye: No discharge.     Extraocular Movements: Extraocular movements intact.     Conjunctiva/sclera:     Left eye: Hemorrhage present.     Pupils: Pupils are equal, round, and reactive to light.   Cardiovascular:     Rate and Rhythm: Normal rate and regular rhythm.     Heart sounds: Normal heart sounds.  Pulmonary:      Effort: Pulmonary effort is normal. No respiratory distress.     Breath sounds: Normal breath  sounds.  Musculoskeletal:     Cervical back: Neck supple.  Skin:    General: Skin is warm and dry.  Neurological:     Mental Status: She is alert.  Psychiatric:        Mood and Affect: Mood normal.        Behavior: Behavior normal.     UC Treatments / Results  Labs (all labs ordered are listed, but only abnormal results are displayed) Labs Reviewed - No data to display  EKG   Radiology No results found.  Procedures Procedures (including critical care time)  Medications Ordered in UC Medications - No data to display  Initial Impression / Assessment and Plan / UC Course  I have reviewed the triage vital signs and the nursing notes.  Pertinent labs & imaging results that were available during my care of the patient were reviewed by me and considered in my medical decision making (see chart for details).  Subconjunctival hemorrhage of left eye.  No trauma.  No eye pain or changes in vision.  Discussed symptomatic treatment; education provided on subconjunctival hemorrhage.  Instructed patient to follow-up with her eye care provider if her symptoms are not improving.  ED precautions discussed.  She agrees to plan of care.   Final Clinical Impressions(s) / UC Diagnoses   Final diagnoses:  Subconjunctival hemorrhage of left eye     Discharge Instructions      Follow-up with your eye doctor if your symptoms are not improving.    Go to the emergency department if you have acute eye pain, changes in your vision, or other concerning symptoms.        ED Prescriptions   None    PDMP not reviewed this encounter.   Sharion Balloon, NP 01/17/21 1537

## 2021-01-17 NOTE — Discharge Instructions (Addendum)
Follow-up with your eye doctor if your symptoms are not improving.    Go to the emergency department if you have acute eye pain, changes in your vision, or other concerning symptoms.

## 2021-01-17 NOTE — ED Triage Notes (Signed)
Pt here with a left bloodshot eye since waking up this morning. No pain or vision changes.

## 2021-03-17 DIAGNOSIS — F411 Generalized anxiety disorder: Secondary | ICD-10-CM | POA: Diagnosis not present

## 2021-03-17 DIAGNOSIS — F902 Attention-deficit hyperactivity disorder, combined type: Secondary | ICD-10-CM | POA: Diagnosis not present

## 2021-03-17 DIAGNOSIS — F332 Major depressive disorder, recurrent severe without psychotic features: Secondary | ICD-10-CM | POA: Diagnosis not present

## 2021-04-27 ENCOUNTER — Other Ambulatory Visit: Payer: Self-pay | Admitting: Family Medicine

## 2021-04-27 DIAGNOSIS — E1165 Type 2 diabetes mellitus with hyperglycemia: Secondary | ICD-10-CM

## 2021-04-27 NOTE — Telephone Encounter (Signed)
Copied from CRM [id number]. Topic: Quick Communication - Rx Refill/Question ?>> Apr 27, 2021 10:43 AM Marylen Ponto wrote: ?Medication: JANUVIA 25 MG tablet ? ?Has the patient contacted their pharmacy? Yes.  Pt told to contact provider ?(Agent: If no, request that the patient contact the pharmacy for the refill. If patient does not wish to contact the pharmacy document the reason why and proceed with request.) ?(Agent: If yes, when and what did the pharmacy advise?) ? ?Preferred Pharmacy (with phone number or street name): [id number] - [place], [place] - [address]  ?Phone: [phone] ?Fax: [phone] ? ?Has the patient been seen for an appointment in the last year OR does the patient have an upcoming appointment? Yes.   ? ?Agent: Please be advised that RX refills may take up to 3 business days. We ask that you follow-up with your pharmacy. ?

## 2021-04-28 DIAGNOSIS — F411 Generalized anxiety disorder: Secondary | ICD-10-CM | POA: Diagnosis not present

## 2021-04-28 DIAGNOSIS — F332 Major depressive disorder, recurrent severe without psychotic features: Secondary | ICD-10-CM | POA: Diagnosis not present

## 2021-04-28 DIAGNOSIS — F902 Attention-deficit hyperactivity disorder, combined type: Secondary | ICD-10-CM | POA: Diagnosis not present

## 2021-04-28 MED ORDER — SITAGLIPTIN PHOSPHATE 25 MG PO TABS: 25.0000 mg | ORAL_TABLET | Freq: Every day | ORAL | 0 refills | Status: DC

## 2021-04-28 NOTE — Telephone Encounter (Signed)
Requested Prescriptions  ?Pending Prescriptions Disp Refills  ?? sitaGLIPtin (JANUVIA) 25 MG tablet 90 tablet 0  ?  Sig: Take 1 tablet (25 mg total) by mouth daily.  ?  ? Endocrinology:  Diabetes - DPP-4 Inhibitors Passed - 04/27/2021  1:57 PM  ?  ?  Passed - HBA1C is between 0 and 7.9 and within 180 days  ?  Hgb A1c MFr Bld  ?Date Value Ref Range Status  ?12/15/2020 5.9 (H) <5.7 % of total Hgb Final  ?  Comment:  ?  For someone without known diabetes, a hemoglobin  ?A1c value between 5.7% and 6.4% is consistent with ?prediabetes and should be confirmed with a  ?follow-up test. ?. ?For someone with known diabetes, a value <7% ?indicates that their diabetes is well controlled. A1c ?targets should be individualized based on duration of ?diabetes, age, comorbid conditions, and other ?considerations. ?. ?This assay result is consistent with an increased risk ?of diabetes. ?. ?Currently, no consensus exists regarding use of ?hemoglobin A1c for diagnosis of diabetes for children. ?. ?  ?   ?  ?  Passed - Cr in normal range and within 360 days  ?  Creat  ?Date Value Ref Range Status  ?12/15/2020 0.76 0.60 - 1.00 mg/dL Final  ? ?Creatinine,U  ?Date Value Ref Range Status  ?03/07/2009 145.4 mg/dL Final  ? ?Creatinine, Urine  ?Date Value Ref Range Status  ?09/25/2019 108 20 - 275 mg/dL Final  ?   ?  ?  Passed - Valid encounter within last 6 months  ?  Recent Outpatient Visits   ?      ? 4 months ago Type 2 diabetes mellitus with hyperglycemia, without long-term current use of insulin (HCC)  ? [facility] Danelle Berry, PA-C  ? 10 months ago Type 2 diabetes mellitus with hyperglycemia, without long-term current use of insulin (HCC)  ? [facility] Danelle Berry, PA-C  ? 1 year ago Type 2 diabetes mellitus with hyperglycemia, without long-term current use of insulin (HCC)  ? [facility] Welford Roche D, MD  ? 1 year ago Type 2 diabetes mellitus with hyperglycemia,  without long-term current use of insulin (HCC)  ? [facility] Welford Roche D, MD  ? 1 year ago Type 2 diabetes mellitus with hyperglycemia, without long-term current use of insulin (HCC)  ? [facility] Jamelle Haring, MD  ?  ?  ?Future Appointments   ?        ? In 1 month Danelle Berry, PA-C [facility]  ? In 7 months  [facility]  ?  ? ?  ?  ?  ? ?

## 2021-05-15 ENCOUNTER — Other Ambulatory Visit: Payer: Self-pay | Admitting: Family Medicine

## 2021-05-15 DIAGNOSIS — Z1231 Encounter for screening mammogram for malignant neoplasm of breast: Secondary | ICD-10-CM

## 2021-05-20 ENCOUNTER — Ambulatory Visit
Admission: RE | Admit: 2021-05-20 | Discharge: 2021-05-20 | Disposition: A | Discharge status: Home / Self Care | Payer: Medicare PPO | Source: Ambulatory Visit | Attending: Family Medicine | Admitting: Family Medicine

## 2021-05-20 DIAGNOSIS — Z1231 Encounter for screening mammogram for malignant neoplasm of breast: Secondary | ICD-10-CM | POA: Diagnosis not present

## 2021-05-22 ENCOUNTER — Other Ambulatory Visit: Payer: Self-pay | Admitting: Family Medicine

## 2021-05-22 DIAGNOSIS — R928 Other abnormal and inconclusive findings on diagnostic imaging of breast: Secondary | ICD-10-CM

## 2021-06-04 ENCOUNTER — Ambulatory Visit
Admission: RE | Admit: 2021-06-04 | Discharge: 2021-06-04 | Disposition: A | Discharge status: Home / Self Care | Payer: Medicare PPO | Source: Ambulatory Visit | Attending: Family Medicine | Admitting: Family Medicine

## 2021-06-04 DIAGNOSIS — N6002 Solitary cyst of left breast: Secondary | ICD-10-CM | POA: Diagnosis not present

## 2021-06-04 DIAGNOSIS — R928 Other abnormal and inconclusive findings on diagnostic imaging of breast: Secondary | ICD-10-CM | POA: Diagnosis not present

## 2021-06-11 DIAGNOSIS — M25561 Pain in right knee: Secondary | ICD-10-CM | POA: Diagnosis not present

## 2021-06-11 NOTE — Progress Notes (Signed)
Name: Renee Young   MRN: [id number]    DOB: 02-06-1946   Date:06/12/2021 ? ?     Progress Note ?Today's Provider: Talitha Givens, MHS, PA-C ?Introduced myself to the patient as a Journalist, newspaper and provided education on APPs in clinical practice.  ? ?Subjective ? ?Chief Complaint ? ?Six month follow up for chronic conditions  ? ? ?HPI ? ?Diabetes: ? ?Diabetes, Type 2 ?- Last A1c 5.9% ?- Medications: januvia 25 mg PO QD  ?- Compliance: excellent ?- Checking BG at home: yes but not often- usually around 100  ?- Diet: Trying to follow a keto diet but has some hang-ups when she eats out or with friends ?- Exercise: Yes, gardening a lot and uses exercise bike  ?- Eye exam: Oct 2022  ?- Foot exam: Today during apt  ?- Microalbumin: 2.1 in Oct 2022  ?- Statin: no, taking red yeast rice  ?- PNA vaccine: completed  ?- Denies symptoms of hypoglycemia, polyuria, polydipsia, numbness extremities, foot ulcers/trauma ? ? ?HTN: ?Hypertension: ?- Medications: not currently taking anything  ?- Compliance: not currently prescribed anything - does not check at home ?- Checking BP at home: no ?- Denies any SOB, CP, vision changes, LE edema, medication SEs, or symptoms of hypotension ?- Diet: Trying to follow keto ?- Exercise: gardening and exercise bike regularly  ? ? ?HLD:  ?Trying to follow keto diet ?Taking red yeast rice to assist with management  ? ?Depression:  ?Currently managed by psychiatry in [place]  ?Taking Prozac and Adderall for ADHD  ? ? ?Acute concern for skin rash/ lesion on left medial knee ?States it started in Feb  ?Has tried multiple cortizone creams of different strengths but has not had resolution ?Reports intermittent itching ?Reports lesion is intermittent and is unsure of trigger ? ? ?Patient Active Problem List  ? Diagnosis Date Noted  ? MDD (major depressive disorder), recurrent, in partial remission ([place]) 01/06/2021  ? S/P total knee arthroplasty 10/08/2019  ? Tachycardia 09/25/2019  ? Hypochromic erythrocytes  09/24/2019  ? Microcytic erythrocytes 09/24/2019  ? Prediabetes 05/30/2019  ? Family history of thyroid disease 05/30/2019  ? Primary osteoarthritis of both knees 05/30/2019  ? Bilateral temporomandibular joint pain 05/30/2019  ? History of colonic polyps 12/17/2016  ? Varicose veins of bilateral lower extremities with other complications 41/93/7902  ? Pain in limb 07/03/2012  ? OA (osteoarthritis) of knee 03/21/2009  ? Type 2 diabetes mellitus with hyperglycemia, without long-term current use of insulin ([place]) 07/19/2007  ? Mixed hyperlipidemia 07/19/2007  ? DEPRESSION 07/19/2007  ? ATTENTION DEFICIT DISORDER 07/19/2007  ? ? ?Past Surgical History:  ?Procedure Laterality Date  ? CESAREAN SECTION    ? TOTAL KNEE ARTHROPLASTY Left 10/08/2019  ? Procedure: TOTAL KNEE ARTHROPLASTY;  Surgeon: Gaynelle Arabian, MD;  Location: [facility];  Service: Orthopedics;  Laterality: Left;  ? TUBAL LIGATION    ? URETEROCELE INCISION    ? VASCULAR SURGERY    ? varicose vein laser surgery  ? ? ?Family History  ?Problem Relation Age of Onset  ? Cancer Mother   ? Kidney disease Mother   ? Diabetes Father   ? Heart disease Father   ? Hyperlipidemia Father   ? Hypertension Father   ? Stroke Father   ? Arthritis Sister   ? Obesity Brother   ? Arthritis Brother   ? Heart disease Maternal Grandmother   ? Stroke Maternal Grandmother   ? Hypertension Maternal Grandmother   ? Heart disease Maternal Grandfather   ?  Hyperlipidemia Maternal Grandfather   ? Stroke Paternal Grandmother   ? Heart disease Paternal Grandmother   ? Heart disease Paternal Grandfather   ? Heart attack Paternal Grandfather   ? Arthritis Daughter   ? ? ?Social History  ? ?Tobacco Use  ? Smoking status: Never  ? Smokeless tobacco: Never  ?Substance Use Topics  ? Alcohol use: Yes  ?  Alcohol/week: 2.0 standard drinks  ?  Types: 2 Glasses of wine per week  ?  Comment: occas.  ? ? ? ?Current Outpatient Medications:  ?  ACCU-CHEK GUIDE test strip, , Disp: , Rfl:  ?  Accu-Chek Softclix  Lancets lancets, , Disp: , Rfl:  ?  amphetamine-dextroamphetamine (ADDERALL) 20 MG tablet, Take 20 mg by mouth daily. , Disp: , Rfl: 0 ?  blood glucose meter kit and supplies KIT, Dispense based on patient and insurance preference. Use up to four times daily as directed. (FOR ICD-9 250.00, 250.01)., Disp: 1 each, Rfl: 0 ?  cholecalciferol (VITAMIN D) 1000 units tablet, Take 1,000 Units by mouth daily., Disp: , Rfl:  ?  Echinacea 125 MG CAPS, Take by mouth., Disp: , Rfl:  ?  FLUoxetine (PROZAC) 20 MG capsule, Take 40 mg by mouth at bedtime. , Disp: , Rfl:  ?  hydrocortisone valerate cream (WESTCORT) 0.2 %, Apply 1 application. topically 2 (two) times daily., Disp: 45 g, Rfl: 0 ?  Red Yeast Rice Extract (RED YEAST RICE PO), Take by mouth., Disp: , Rfl:  ?  sitaGLIPtin (JANUVIA) 25 MG tablet, Take 1 tablet (25 mg total) by mouth daily., Disp: 90 tablet, Rfl: 0 ? ?Allergies  ?Allergen Reactions  ? Nitrofurantoin Hives and Swelling  ? Penicillins Swelling  ?  Tolerated Cephalosporin Date: 10/09/19. ? ?  ? ? ?I personally reviewed active problem list, medication list, allergies, health maintenance with the patient/caregiver today. ? ? ?Review of Systems  ?Constitutional:  Negative for chills, fever, malaise/fatigue and weight loss.  ?Eyes:  Negative for blurred vision and double vision.  ?Respiratory:  Negative for cough and shortness of breath.   ?Cardiovascular:  Negative for chest pain, palpitations and leg swelling.  ?Genitourinary:  Negative for frequency and urgency.  ?Musculoskeletal:  Negative for falls.  ?Skin:  Positive for rash.  ?Neurological:  Negative for dizziness, tingling, weakness and headaches.  ?Endo/Heme/Allergies:  Negative for polydipsia.  ? ? ? ?Objective ? ?Vitals:  ? 06/12/21 0903  ?BP: 126/78  ?Pulse: 93  ?Resp: 16  ?SpO2: 98%  ?Weight: 167 lb (75.8 kg)  ?Height: _0  (1.626 m)  ? ? ?Body mass index is 28.67 kg/m?. ? ?Physical Exam ?Vitals reviewed.  ?Constitutional:   ?   General: She is  awake.  ?   Appearance: Normal appearance. She is well-developed, well-groomed and overweight.  ?HENT:  ?   Head: Normocephalic and atraumatic.  ?Eyes:  ?   General: Lids are normal.  ?   Extraocular Movements: Extraocular movements intact.  ?   Conjunctiva/sclera: Conjunctivae normal.  ?   Pupils: Pupils are equal, round, and reactive to light.  ?Neck:  ?   Thyroid: No thyroid mass, thyromegaly or thyroid tenderness.  ?Cardiovascular:  ?   Rate and Rhythm: Normal rate and regular rhythm.  ?   Pulses: Normal pulses.     ?     Dorsalis pedis pulses are 2+ on the right side and 2+ on the left side.  ?     Posterior tibial pulses are 2+ on the right side  and 2+ on the left side.  ?   Heart sounds: Normal heart sounds.  ?Pulmonary:  ?   Effort: Pulmonary effort is normal.  ?   Breath sounds: Normal breath sounds and air entry. No decreased breath sounds, wheezing, rhonchi or rales.  ?Musculoskeletal:  ?   Cervical back: Normal range of motion and neck supple.  ?   Right lower leg: No edema.  ?   Left lower leg: No edema.  ?   Right foot: Normal range of motion.  ?   Left foot: Normal range of motion.  ?     Feet: ? ?Feet:  ?   Right foot:  ?   Protective Sensation: 8 sites tested.  8 sites sensed.  ?   Skin integrity: Skin integrity normal.  ?   Toenail Condition: Right toenails are normal.  ?   Left foot:  ?   Protective Sensation: 7 sites tested.  9 sites sensed.  ?   Skin integrity: Skin integrity normal.  ?   Toenail Condition: Left toenails are normal.  ?Lymphadenopathy:  ?   Head:  ?   Right side of head: No submental or submandibular adenopathy.  ?   Left side of head: No submental or submandibular adenopathy.  ?   Upper Body:  ?   Right upper body: No supraclavicular adenopathy.  ?   Left upper body: No supraclavicular adenopathy.  ?Skin: ?   General: Skin is warm.  ?   Findings: Erythema and rash present. Rash is scaling.  ? ?    ?Neurological:  ?   Mental Status: She is alert.  ?Psychiatric:     ?   Attention  and Perception: Attention normal.     ?   Mood and Affect: Mood and affect normal.     ?   Speech: Speech normal.     ?   Behavior: Behavior normal. Behavior is cooperative.  ? ? ? ?No results found for this or any

## 2021-06-12 ENCOUNTER — Encounter: Payer: Self-pay | Admitting: Physician Assistant

## 2021-06-12 ENCOUNTER — Ambulatory Visit: Payer: Medicare PPO | Admitting: Physician Assistant

## 2021-06-12 VITALS — BP 126/78 | HR 93 | Resp 16 | Ht 64.0 in | Wt 167.0 lb

## 2021-06-12 DIAGNOSIS — E1165 Type 2 diabetes mellitus with hyperglycemia: Secondary | ICD-10-CM | POA: Diagnosis not present

## 2021-06-12 DIAGNOSIS — L209 Atopic dermatitis, unspecified: Secondary | ICD-10-CM

## 2021-06-12 DIAGNOSIS — E782 Mixed hyperlipidemia: Secondary | ICD-10-CM | POA: Diagnosis not present

## 2021-06-12 DIAGNOSIS — F3341 Major depressive disorder, recurrent, in partial remission: Secondary | ICD-10-CM

## 2021-06-12 DIAGNOSIS — Z1211 Encounter for screening for malignant neoplasm of colon: Secondary | ICD-10-CM | POA: Diagnosis not present

## 2021-06-12 MED ORDER — HYDROCORTISONE VALERATE 0.2 % EX CREA: 1.0000 "application " | TOPICAL_CREAM | Freq: Two times a day (BID) | CUTANEOUS | 0 refills | Status: DC

## 2021-06-12 NOTE — Patient Instructions (Signed)
I have sent in a steroid cream Hydrocortisone valerate 0.2 % for the area on your left knee  ?Please apply approx 1 cm strip of cream to the area once per day ?Let us know if you see any changes or if the area gets worse ? ?Please return to the office for blood work as these labs need to be performed when you are fasting ?This means:  ?No food or drink for at least 8 hours prior to blood work ?You may drink water or black coffee  ?You may take your normal medications with water prior to labs  ? ? ?

## 2021-06-12 NOTE — Assessment & Plan Note (Signed)
Chronic, historic condition ?Currently very well managed with Januvia 25 mg PO QD  ?Previous A1c was 5.9% ?Recheck and adjustments as indicated ?Continue current medications for now ?Encouraged exercise and diet management  ?Follow up in 6 months ? ?

## 2021-06-12 NOTE — Assessment & Plan Note (Signed)
Chronic, historic condition ?Currently managed with Red Yeast rice ?Continue current medications ?Recheck Lipid panel  ?Results to dictate further management  ?Follow up in 6 months ? ?

## 2021-06-12 NOTE — Assessment & Plan Note (Signed)
Chronic, historic condition, stable, ?Currently managed with Prozac 40 mg PO QHS ?Managed by psychiatry provider in [place] ?Follow up with Psych recommended  ? ?

## 2021-06-15 ENCOUNTER — Telehealth: Payer: Self-pay

## 2021-06-15 DIAGNOSIS — E1165 Type 2 diabetes mellitus with hyperglycemia: Secondary | ICD-10-CM | POA: Diagnosis not present

## 2021-06-15 DIAGNOSIS — E782 Mixed hyperlipidemia: Secondary | ICD-10-CM | POA: Diagnosis not present

## 2021-06-15 NOTE — Telephone Encounter (Signed)
Patient does not want to have colonoscopy done  ?

## 2021-06-16 LAB — COMPLETE METABOLIC PANEL WITH GFR
AG Ratio: 1.3 (calc) (ref 1.0–2.5)
ALT: 20 U/L (ref 6–29)
AST: 18 U/L (ref 10–35)
Albumin: 4.1 g/dL (ref 3.6–5.1)
Alkaline phosphatase (APISO): 89 U/L (ref 37–153)
BUN: 20 mg/dL (ref 7–25)
CO2: 28 mmol/L (ref 20–32)
Calcium: 9.9 mg/dL (ref 8.6–10.4)
Chloride: 104 mmol/L (ref 98–110)
Creat: 0.81 mg/dL (ref 0.60–1.00)
Globulin: 3.1 g/dL (calc) (ref 1.9–3.7)
Glucose, Bld: 93 mg/dL (ref 65–99)
Potassium: 4.1 mmol/L (ref 3.5–5.3)
Sodium: 139 mmol/L (ref 135–146)
Total Bilirubin: 0.4 mg/dL (ref 0.2–1.2)
Total Protein: 7.2 g/dL (ref 6.1–8.1)
eGFR: 76 mL/min/{1.73_m2} (ref >=60)

## 2021-06-16 LAB — HEMOGLOBIN A1C
Hgb A1c MFr Bld: 5.9 % of total Hgb — ABNORMAL HIGH (ref <5.7)
Mean Plasma Glucose: 123 mg/dL
eAG (mmol/L): 6.8 mmol/L

## 2021-06-16 LAB — LIPID PANEL
Cholesterol: 226 mg/dL — ABNORMAL HIGH (ref <200)
HDL: 48 mg/dL — ABNORMAL LOW (ref >=50)
LDL Cholesterol (Calc): 148 mg/dL (calc) — ABNORMAL HIGH
Non-HDL Cholesterol (Calc): 178 mg/dL (calc) — ABNORMAL HIGH (ref <130)
Total CHOL/HDL Ratio: 4.7 (calc) (ref <5.0)
Triglycerides: 164 mg/dL — ABNORMAL HIGH (ref <150)

## 2021-07-21 DIAGNOSIS — M1711 Unilateral primary osteoarthritis, right knee: Secondary | ICD-10-CM | POA: Diagnosis not present

## 2021-07-30 DIAGNOSIS — F332 Major depressive disorder, recurrent severe without psychotic features: Secondary | ICD-10-CM | POA: Diagnosis not present

## 2021-07-30 DIAGNOSIS — F411 Generalized anxiety disorder: Secondary | ICD-10-CM | POA: Diagnosis not present

## 2021-07-30 DIAGNOSIS — F902 Attention-deficit hyperactivity disorder, combined type: Secondary | ICD-10-CM | POA: Diagnosis not present

## 2021-08-25 ENCOUNTER — Ambulatory Visit: Payer: Medicare PPO | Admitting: Family Medicine

## 2021-08-27 NOTE — Progress Notes (Deleted)
Name: Renee Young   MRN: [id number]    DOB: [birth year]   Date:08/27/2021       Progress Note  No chief complaint on file.    Subjective:   Renee Young is a 76 y.o. female, presents to clinic for   Appt was made for "A1C" Last seen by float provider and DM well controlled Last A1c in chart was 2 months ago and 5.9 well controlled as well    Current Outpatient Medications:    ACCU-CHEK GUIDE test strip, , Disp: , Rfl:    Accu-Chek Softclix Lancets lancets, , Disp: , Rfl:    amphetamine-dextroamphetamine (ADDERALL) 20 MG tablet, Take 20 mg by mouth daily. , Disp: , Rfl: 0   blood glucose meter kit and supplies KIT, Dispense based on patient and insurance preference. Use up to four times daily as directed. (FOR ICD-9 250.00, 250.01)., Disp: 1 each, Rfl: 0   cholecalciferol (VITAMIN D) 1000 units tablet, Take 1,000 Units by mouth daily., Disp: , Rfl:    Echinacea 125 MG CAPS, Take by mouth., Disp: , Rfl:    FLUoxetine (PROZAC) 20 MG capsule, Take 40 mg by mouth at bedtime. , Disp: , Rfl:    hydrocortisone valerate cream (WESTCORT) 0.2 %, Apply 1 application. topically 2 (two) times daily., Disp: 45 g, Rfl: 0   Red Yeast Rice Extract (RED YEAST RICE PO), Take by mouth., Disp: , Rfl:    sitaGLIPtin (JANUVIA) 25 MG tablet, Take 1 tablet (25 mg total) by mouth daily., Disp: 90 tablet, Rfl: 0  Patient Active Problem List   Diagnosis Date Noted   MDD (major depressive disorder), recurrent, in partial remission ([place]) 01/06/2021   S/P total knee arthroplasty 10/08/2019   Tachycardia 09/25/2019   Hypochromic erythrocytes 09/24/2019   Microcytic erythrocytes 09/24/2019   Prediabetes 05/30/2019   Family history of thyroid disease 05/30/2019   Primary osteoarthritis of both knees 05/30/2019   Bilateral temporomandibular joint pain 05/30/2019   History of colonic polyps 12/17/2016   Varicose veins of bilateral lower extremities with other complications 96/28/3662   Pain in limb  07/03/2012   OA (osteoarthritis) of knee 03/21/2009   Type 2 diabetes mellitus with hyperglycemia, without long-term current use of insulin ([place]) 07/19/2007   Mixed hyperlipidemia 07/19/2007   DEPRESSION 07/19/2007   ATTENTION DEFICIT DISORDER 07/19/2007    Past Surgical History:  Procedure Laterality Date   CESAREAN SECTION     TOTAL KNEE ARTHROPLASTY Left 10/08/2019   Procedure: TOTAL KNEE ARTHROPLASTY;  Surgeon: Gaynelle Arabian, MD;  Location: [facility];  Service: Orthopedics;  Laterality: Left;   TUBAL LIGATION     URETEROCELE INCISION     VASCULAR SURGERY     varicose vein laser surgery    Family History  Problem Relation Age of Onset   Cancer Mother    Kidney disease Mother    Diabetes Father    Heart disease Father    Hyperlipidemia Father    Hypertension Father    Stroke Father    Arthritis Sister    Obesity Brother    Arthritis Brother    Heart disease Maternal Grandmother    Stroke Maternal Grandmother    Hypertension Maternal Grandmother    Heart disease Maternal Grandfather    Hyperlipidemia Maternal Grandfather    Stroke Paternal Grandmother    Heart disease Paternal Grandmother    Heart disease Paternal Grandfather    Heart attack Paternal Grandfather    Arthritis Daughter  Social History   Tobacco Use   Smoking status: Never   Smokeless tobacco: Never  Vaping Use   Vaping Use: Never used  Substance Use Topics   Alcohol use: Yes    Alcohol/week: 2.0 standard drinks of alcohol    Types: 2 Glasses of wine per week    Comment: occas.   Drug use: No     Allergies  Allergen Reactions   Nitrofurantoin Hives and Swelling   Penicillins Swelling    Tolerated Cephalosporin Date: 10/09/19.      Health Maintenance  Topic Date Due   Zoster Vaccines- Shingrix (1 of 2) Never done   DEXA SCAN  Never done   COLONOSCOPY (Pts 45-6yr Insurance coverage will need to be confirmed)  12/28/2018   COVID-19 Vaccine (3 - Moderna series) 07/04/2019    OPHTHALMOLOGY EXAM  08/26/2021   INFLUENZA VACCINE  09/15/2021   URINE MICROALBUMIN  12/15/2021   HEMOGLOBIN A1C  12/16/2021   TETANUS/TDAP  04/28/2022   MAMMOGRAM  05/21/2022   FOOT EXAM  06/13/2022   Pneumonia Vaccine 76 Years old  Completed   Hepatitis C Screening  Completed   HPV VACCINES  Aged Out    Chart Review Today: ***  Review of Systems   Objective:   There were no vitals filed for this visit.  There is no height or weight on file to calculate BMI.  Physical Exam      Assessment & Plan:   Problem List Items Addressed This Visit   None    No follow-ups on file.   LDelsa Grana PA-C 08/27/21 5:02 PM

## 2021-08-28 ENCOUNTER — Ambulatory Visit: Payer: Medicare PPO | Admitting: Family Medicine

## 2021-09-08 DIAGNOSIS — F3341 Major depressive disorder, recurrent, in partial remission: Secondary | ICD-10-CM | POA: Diagnosis not present

## 2021-09-08 DIAGNOSIS — F411 Generalized anxiety disorder: Secondary | ICD-10-CM | POA: Diagnosis not present

## 2021-09-08 DIAGNOSIS — M1990 Unspecified osteoarthritis, unspecified site: Secondary | ICD-10-CM | POA: Diagnosis not present

## 2021-09-08 DIAGNOSIS — F902 Attention-deficit hyperactivity disorder, combined type: Secondary | ICD-10-CM | POA: Diagnosis not present

## 2021-09-08 DIAGNOSIS — Z791 Long term (current) use of non-steroidal anti-inflammatories (NSAID): Secondary | ICD-10-CM | POA: Diagnosis not present

## 2021-09-08 DIAGNOSIS — E119 Type 2 diabetes mellitus without complications: Secondary | ICD-10-CM | POA: Diagnosis not present

## 2021-09-08 DIAGNOSIS — I951 Orthostatic hypotension: Secondary | ICD-10-CM | POA: Diagnosis not present

## 2021-09-08 DIAGNOSIS — Z7984 Long term (current) use of oral hypoglycemic drugs: Secondary | ICD-10-CM | POA: Diagnosis not present

## 2021-09-08 DIAGNOSIS — R030 Elevated blood-pressure reading, without diagnosis of hypertension: Secondary | ICD-10-CM | POA: Diagnosis not present

## 2021-09-18 ENCOUNTER — Ambulatory Visit: Payer: Medicare PPO | Admitting: Family Medicine

## 2021-09-21 ENCOUNTER — Ambulatory Visit (INDEPENDENT_AMBULATORY_CARE_PROVIDER_SITE_OTHER): Payer: Medicare PPO | Admitting: Nurse Practitioner

## 2021-09-21 ENCOUNTER — Encounter: Payer: Self-pay | Admitting: Nurse Practitioner

## 2021-09-21 VITALS — BP 118/66 | HR 94 | Temp 98.0°F | Resp 16 | Ht 64.0 in | Wt 163.5 lb

## 2021-09-21 DIAGNOSIS — E1165 Type 2 diabetes mellitus with hyperglycemia: Secondary | ICD-10-CM

## 2021-09-21 DIAGNOSIS — F419 Anxiety disorder, unspecified: Secondary | ICD-10-CM

## 2021-09-21 LAB — POCT GLYCOSYLATED HEMOGLOBIN (HGB A1C): Hemoglobin A1C: 6.1 % — AB (ref 4.0–5.6)

## 2021-09-21 MED ORDER — SITAGLIPTIN PHOSPHATE 25 MG PO TABS: 25.0000 mg | ORAL_TABLET | Freq: Every day | ORAL | 1 refills | Status: DC

## 2021-09-21 MED ORDER — LORAZEPAM 0.5 MG PO TABS: 0.5000 mg | ORAL_TABLET | Freq: Three times a day (TID) | ORAL | 0 refills | Status: DC | PRN

## 2021-09-21 NOTE — Assessment & Plan Note (Signed)
Continue taking Januvia 25 mg daily.  Patient's A1c today is 6.1.

## 2021-09-21 NOTE — Progress Notes (Signed)
BP 118/66   Pulse 94   Temp 98 F (36.7 C) (Oral)   Resp 16   Ht 5\' 4"  (1.626 m)   Wt 163 lb 8 oz (74.2 kg)   SpO2 98%   BMI 28.06 kg/m    Subjective:    Patient ID: , female    DOB: Dec 26, 1945, 76 y.o.   MRN: [id number]  HPI: Renee Young is a 76 y.o. female  Chief Complaint  Patient presents with   Follow-up   Diabetes    Pt will come in on 8/29 to get her free diabetic eye exam done   Diabetes: Her last A1c was 5.9 on 06/15/2021.  Patient is currently taking Januvia 25 mg daily.  Denies any polydipsia, polyphasia, or polyuria.  Patient also denies any hypoglycemic episodes.  Patient reports her blood sugars at home are running around 110. She is up to date on foot exam. She is due for her eye exam.  Her A1C today is 6.1.   Anxiety:  She is going on a plane to [place]/15/20[place].  She says that she does have a hard time flying and is requesting a medication to help. Discussed ativan that was recommended by home health nurse.  Will send in enough for flights to [place] and back.   Relevant past medical, surgical, family and social history reviewed and updated as indicated. Interim medical history since our last visit reviewed. Allergies and medications reviewed and updated.  Review of Systems  Constitutional: Negative for fever or weight change.  Respiratory: Negative for cough and shortness of breath.   Cardiovascular: Negative for chest pain or palpitations.  Gastrointestinal: Negative for abdominal pain, no bowel changes.  Musculoskeletal: Negative for gait problem or joint swelling.  Skin: Negative for rash.  Neurological: Negative for dizziness or headache.  No other specific complaints in a complete review of systems (except as listed in HPI above).      Objective:    BP 118/66   Pulse 94   Temp 98 F (36.7 C) (Oral)   Resp 16   Ht 5\' 4"  (1.626 m)   Wt 163 lb 8 oz (74.2 kg)   SpO2 98%   BMI 28.06 kg/m   Wt Readings from Last 3 Encounters:  09/21/21 163 lb  8 oz (74.2 kg)  06/12/21 167 lb (75.8 kg)  12/15/20 168 lb 9.6 oz (76.5 kg)    Physical Exam  Constitutional: Patient appears well-developed and well-nourished. Obese  No distress.  HEENT: head atraumatic, normocephalic, pupils equal and reactive to light, neck supple Cardiovascular: Normal rate, regular rhythm and normal heart sounds.  No murmur heard. No BLE edema. Pulmonary/Chest: Effort normal and breath sounds normal. No respiratory distress. Abdominal: Soft.  There is no tenderness. Psychiatric: Patient has a normal mood and affect. behavior is normal. Judgment and thought content normal.  Results for orders placed or performed in visit on 09/21/21  POCT HgB A1C  Result Value Ref Range   Hemoglobin A1C 6.1 (A) 4.0 - 5.6 %   HbA1c POC (<> result, manual entry)     HbA1c, POC (prediabetic range)     HbA1c, POC (controlled diabetic range)        Assessment & Plan:   Problem List Items Addressed This Visit       Endocrine   Type 2 diabetes mellitus with hyperglycemia, without long-term current use of insulin (H[facility]) - Primary    Continue taking Januvia 25 mg daily.  Patient's A1c  today is 6.1.      Relevant Medications   sitaGLIPtin (JANUVIA) 25 MG tablet   Other Relevant Orders   POCT HgB A1C (Completed)   Other Visit Diagnoses     Anxiety       Patient experience anxiety when flying.  Patient has a long trip going to [place].  We will send in some Ativan to help anxiety with flying.   Relevant Medications   LORazepam (ATIVAN) 0.5 MG tablet        Follow up plan: Return in about 3 months (around 12/22/2021) for follow up, when she gets back from [place].

## 2021-10-02 ENCOUNTER — Other Ambulatory Visit: Payer: Self-pay | Admitting: Family Medicine

## 2021-10-02 DIAGNOSIS — F419 Anxiety disorder, unspecified: Secondary | ICD-10-CM

## 2021-10-02 NOTE — Telephone Encounter (Signed)
Requested medication (s) are due for refill today:no  Requested medication (s) are on the active medication list: yes  Last refill:  09/21/21  Future visit scheduled: yes  Notes to clinic:  Unable to refill per protocol, cannot delegate. Possible duplicate request     Requested Prescriptions  Pending Prescriptions Disp Refills   LORazepam (ATIVAN) 0.5 MG tablet 20 tablet 0    Sig: Take 1-2 tablets (0.5-1 mg total) by mouth every 8 (eight) hours as needed for anxiety or sleep (take for plane ride).     Not Delegated - Psychiatry: Anxiolytics/Hypnotics 2 Failed - 10/02/2021 11:57 AM      Failed - This refill cannot be delegated      Failed - Urine Drug Screen completed in last 360 days      Passed - Patient is not pregnant      Passed - Valid encounter within last 6 months    Recent Outpatient Visits           1 week ago Type 2 diabetes mellitus with hyperglycemia, without long-term current use of insulin [facility])   [facility] Della Goo F, FNP   3 months ago Type 2 diabetes mellitus with hyperglycemia, without long-term current use of insulin (HCC)   [facility] Mecum, Erin E, PA-C   9 months ago Type 2 diabetes mellitus with hyperglycemia, without long-term current use of insulin [facility])   [facility] [place], Sheliah Mends, PA-C   1 year ago Type 2 diabetes mellitus with hyperglycemia, without long-term current use of insulin [facility])   [facility] Danelle Berry, PA-C   1 year ago Type 2 diabetes mellitus with hyperglycemia, without long-term current use of insulin [facility])   [facility] Jamelle Haring, MD       Future Appointments             In 2 months [facility], [facility]

## 2021-10-02 NOTE — Telephone Encounter (Unsigned)
Copied from CRM [id number]. Topic: General - Other >> Oct 02, 2021 10:27 AM Everette C wrote: Reason for CRM: Medication Refill - Medication: LORazepam (ATIVAN) 0.5 MG tablet [[id number]]   Has the patient contacted their pharmacy? Yes.  The medication was previously submitted to a pharmacy that was out of stock  (Agent: If no, request that the patient contact the pharmacy for the refill. If patient does not wish to contact the pharmacy document the reason why and proceed with request.) (Agent: If yes, when and what did the pharmacy advise?)  Preferred Pharmacy (with phone number or street name): [facility] [id number] [place], [place] - [address] [place] [address] Phone: [phone] Fax: [phone] Hours: Not open 24 hours   Has the patient been seen for an appointment in the last year OR does the patient have an upcoming appointment? Yes.    Agent: Please be advised that RX refills may take up to 3 business days. We ask that you follow-up with your pharmacy.

## 2021-10-05 ENCOUNTER — Telehealth: Payer: Self-pay

## 2021-10-05 DIAGNOSIS — F419 Anxiety disorder, unspecified: Secondary | ICD-10-CM

## 2021-10-09 ENCOUNTER — Other Ambulatory Visit: Payer: Self-pay | Admitting: Nurse Practitioner

## 2021-10-09 DIAGNOSIS — F419 Anxiety disorder, unspecified: Secondary | ICD-10-CM

## 2021-10-09 MED ORDER — LORAZEPAM 0.5 MG PO TABS: 0.5000 mg | ORAL_TABLET | Freq: Three times a day (TID) | ORAL | 0 refills | Status: DC | PRN

## 2021-10-09 NOTE — Telephone Encounter (Signed)
Do you allow me to resend?

## 2021-10-09 NOTE — Telephone Encounter (Signed)
Pt.notified

## 2021-10-09 NOTE — Telephone Encounter (Signed)
Pt received a RX for Lorazepam on 8.7.23/ [facility] /Pt has been looking for a pharmacy that has it / pt is wanted the RX to be resent to [facility] in [place] / please call pt and advise when sent

## 2021-10-13 LAB — HM DIABETES EYE EXAM

## 2021-10-16 ENCOUNTER — Ambulatory Visit: Payer: Self-pay | Admitting: *Deleted

## 2021-10-16 NOTE — Patient Outreach (Signed)
  Care Coordination   Initial Visit Note   10/16/2021 Name: KENTRELL GUETTLER MRN: [id number] DOB: [birth year]  HEYDY MONTILLA is a 76 y.o. year old female who sees Danelle Berry, [place] for primary care. I spoke with  Kellie Simmering by phone today.  What matters to the patients health and wellness today?  Diabetes management  and issues with skin   Goals Addressed             This Visit's Progress    care coordination activities       Care Coordination Interventions: Care Management program offered/discussed SDOH screen completed  AWV scheduled for 12/10/21 Patient denied having any social work need but would like follow up from a nurse to manage her diabetes and skin issues Patient will be going on a trip to [place], appointment with RNCM scheduled for 01/19/22 at 9am        SDOH assessments and interventions completed:  Yes  SDOH Interventions Today    Flowsheet Row Most Recent Value  SDOH Interventions   Food Insecurity Interventions Intervention Not Indicated  Physical Activity Interventions Intervention Not Indicated  Stress Interventions Intervention Not Indicated  Transportation Interventions Intervention Not Indicated        Care Coordination Interventions Activated:  Yes  Care Coordination Interventions:  Yes, provided   Follow up plan: Follow up call scheduled for 01/19/22    Encounter Outcome:  Pt. Scheduled

## 2021-10-16 NOTE — Patient Instructions (Signed)
Visit Information  Thank you for taking time to visit with me today. Please don't hesitate to contact me if I can be of assistance to you.   Following are the goals we discussed today:   Goals Addressed             This Visit's Progress    care coordination activities       Care Coordination Interventions: Care Management program offered/discussed SDOH screen completed  AWV scheduled for 12/10/21 Patient denied having any social work need but would like follow up from a nurse to manage her diabetes and skin issues Patient will be going on a trip to [place], appointment with RNCM scheduled for 01/19/22 at 9am        Our next appointment is by telephone on 01/19/22 at 9am  Please call the care guide team at [phone] if you need to cancel or reschedule your appointment.   If you are experiencing a Mental Health or Behavioral Health Crisis or need someone to talk to, please call the Suicide and Crisis Lifeline: 988   Patient verbalizes understanding of instructions and care plan provided today and agrees to view in MyChart. Active MyChart status and patient understanding of how to access instructions and care plan via MyChart confirmed with patient.     Telephone follow up appointment with care management team member scheduled for: 01/19/22  Verna Czech, LCSW Clinical Social Worker  [facility] Care Management [phone]

## 2021-10-27 ENCOUNTER — Ambulatory Visit: Payer: Medicare PPO | Admitting: Family Medicine

## 2021-10-27 ENCOUNTER — Encounter: Payer: Self-pay | Admitting: Family Medicine

## 2021-10-27 VITALS — BP 116/68 | HR 96 | Temp 97.7°F | Resp 18 | Ht 64.0 in | Wt 163.5 lb

## 2021-10-27 DIAGNOSIS — R21 Rash and other nonspecific skin eruption: Secondary | ICD-10-CM | POA: Diagnosis not present

## 2021-10-27 MED ORDER — TRIAMCINOLONE ACETONIDE 0.025 % EX OINT: 1.0000 | TOPICAL_OINTMENT | Freq: Two times a day (BID) | CUTANEOUS | 1 refills | Status: DC | PRN

## 2021-10-27 NOTE — Patient Instructions (Signed)
Try moisturizing with vaseline or other non-allergenic creams You can use a daily antihistamine like claritin or zyrtec Use the prescription steroid ointment on worst areas with raised patches   Eczema - large part of control is hydration with vaseline, especially after bathing, use mild soaps and detergents, seal skin after bathing, control allergy triggers and use topical steroids sparingly to affected areas/patches.  Can use twice a day for up to one week, as soon as severe areas of rash start to improve decrease use to once a day.  If not improved in 1-2 weeks, please follow up.  If any worsening, please follow up.  See handout below for further info  Atopic Dermatitis Atopic dermatitis is a skin disorder that causes inflammation of the skin. This is the most common type of eczema. Eczema is a group of skin conditions that cause the skin to be itchy, red, and swollen. This condition is generally worse during the cooler winter months and often improves during the warm summer months. Symptoms can vary from person to person. Atopic dermatitis usually starts showing signs in infancy and can last through adulthood. This condition cannot be passed from one person to another (non-contagious), but it is more common in families. Atopic dermatitis may not always be present. When it is present, it is called a flare-up. What are the causes? The exact cause of this condition is not known. Flare-ups of the condition may be triggered by: Contact with something that you are sensitive or allergic to. Stress. Certain foods. Extremely hot or cold weather. Harsh chemicals and soaps. Dry air. Chlorine. What increases the risk? This condition is more likely to develop in people who have a personal history or family history of eczema, allergies, asthma, or hay fever. What are the signs or symptoms? Symptoms of this condition include: Dry, scaly skin. Red, itchy rash. Itchiness, which can be severe. This may  occur before the skin rash. This can make sleeping difficult. Skin thickening and cracking that can occur over time. How is this diagnosed? This condition is diagnosed based on your symptoms, a medical history, and a physical exam. How is this treated? There is no cure for this condition, but symptoms can usually be controlled. Treatment focuses on: Controlling the itchiness and scratching. You may be given medicines, such as antihistamines or steroid creams. Limiting exposure to things that you are sensitive or allergic to (allergens). Recognizing situations that cause stress and developing a plan to manage stress. If your atopic dermatitis does not get better with medicines, or if it is all over your body (widespread), a treatment using a specific type of light (phototherapy) may be used. Follow these instructions at home: Skin care  Keep your skin well-moisturized. Doing this seals in moisture and helps to prevent dryness. Use unscented lotions that have petroleum in them. Avoid lotions that contain alcohol or water. They can dry the skin. Keep baths or showers short (less than 5 minutes) in warm water. Do not use hot water. Use mild, unscented cleansers for bathing. Avoid soap and bubble bath. Apply a moisturizer to your skin right after a bath or shower. Do not apply anything to your skin without checking with your health care provider. General instructions Dress in clothes made of cotton or cotton blends. Dress lightly because heat increases itchiness. When washing your clothes, rinse your clothes twice so all of the soap is removed. Avoid any triggers that can cause a flare-up. Try to manage your stress. Keep your fingernails cut  short. Avoid scratching. Scratching makes the rash and itchiness worse. It may also result in a skin infection (impetigo) due to a break in the skin caused by scratching. Take or apply over-the-counter and prescription medicines only as told by your health  care provider. Keep all follow-up visits as told by your health care provider. This is important. Do not be around people who have cold sores or fever blisters. If you get the infection, it may cause your atopic dermatitis to worsen. Contact a health care provider if: Your itchiness interferes with sleep. Your rash gets worse or it is not better within one week of starting treatment. You have a fever. You have a rash flare-up after having contact with someone who has cold sores or fever blisters. Get help right away if: You develop pus or soft yellow scabs in the rash area. Summary This condition causes a red rash and itchy, dry, scaly skin. Treatment focuses on controlling the itchiness and scratching, limiting exposure to things that you are sensitive or allergic to (allergens), recognizing situations that cause stress, and developing a plan to manage stress. Keep your skin well-moisturized. Keep baths or showers shorter than 5 minutes and use warm water. Do not use hot water. This information is not intended to replace advice given to you by your health care provider. Make sure you discuss any questions you have with your health care provider. Document Released: 01/30/2000 Document Revised: 05/23/2018 Document Reviewed: 03/05/2016 Elsevier Patient Education  2020 [employer].

## 2021-10-27 NOTE — Progress Notes (Signed)
Patient ID: Renee Young, female    DOB: 07/03/1945, 76 y.o.   MRN: [id number]  PCP: Delsa Grana, PA-C  Chief Complaint  Patient presents with   Rash    On several parts on her body mainly near right buttock and left thigh. It comes and go mainly itchy and has been dealing with it over a year    Subjective:   Renee Young is a 76 y.o. female, presents to clinic with CC of the following:  HPI    Ongoing rash on and off for the last year, located to inner thigh, sometimes very itchy to other skin over buttocks or thigh, she has had some issues with very itchy left axilla without rash She has tried lotions No childhood history of eczema Onset many years ago without any significant worsening and nothing over-the-counter has previously caused improvement she is concerned that it could be psoriasis or something else    Patient Active Problem List   Diagnosis Date Noted   MDD (major depressive disorder), recurrent, in partial remission ([place]) 01/06/2021   S/P total knee arthroplasty 10/08/2019   Tachycardia 09/25/2019   Hypochromic erythrocytes 09/24/2019   Microcytic erythrocytes 09/24/2019   Prediabetes 05/30/2019   Family history of thyroid disease 05/30/2019   Primary osteoarthritis of both knees 05/30/2019   Bilateral temporomandibular joint pain 05/30/2019   History of colonic polyps 12/17/2016   Varicose veins of bilateral lower extremities with other complications 79/48/0165   Pain in limb 07/03/2012   OA (osteoarthritis) of knee 03/21/2009   Type 2 diabetes mellitus with hyperglycemia, without long-term current use of insulin ([place]) 07/19/2007   Mixed hyperlipidemia 07/19/2007   DEPRESSION 07/19/2007   ATTENTION DEFICIT DISORDER 07/19/2007      Current Outpatient Medications:    ACCU-CHEK GUIDE test strip, , Disp: , Rfl:    Accu-Chek Softclix Lancets lancets, , Disp: , Rfl:    amphetamine-dextroamphetamine (ADDERALL) 30 MG tablet, Take 1 tablet by mouth 2 (two)  times daily., Disp: , Rfl:    blood glucose meter kit and supplies KIT, Dispense based on patient and insurance preference. Use up to four times daily as directed. (FOR ICD-9 250.00, 250.01)., Disp: 1 each, Rfl: 0   cholecalciferol (VITAMIN D) 1000 units tablet, Take 1,000 Units by mouth daily., Disp: , Rfl:    Echinacea 125 MG CAPS, Take by mouth., Disp: , Rfl:    FLUoxetine (PROZAC) 20 MG capsule, Take 40 mg by mouth at bedtime. , Disp: , Rfl:    Red Yeast Rice Extract (RED YEAST RICE PO), Take by mouth., Disp: , Rfl:    sitaGLIPtin (JANUVIA) 25 MG tablet, Take 1 tablet (25 mg total) by mouth daily., Disp: 90 tablet, Rfl: 1   amphetamine-dextroamphetamine (ADDERALL) 20 MG tablet, Take 20 mg by mouth daily.  (Patient not taking: Reported on 10/27/2021), Disp: , Rfl: 0   hydrocortisone valerate cream (WESTCORT) 0.2 %, Apply 1 application. topically 2 (two) times daily. (Patient not taking: Reported on 10/27/2021), Disp: 45 g, Rfl: 0   LORazepam (ATIVAN) 0.5 MG tablet, Take 1-2 tablets (0.5-1 mg total) by mouth every 8 (eight) hours as needed for anxiety or sleep (take for plane ride). (Patient not taking: Reported on 10/27/2021), Disp: 20 tablet, Rfl: 0   Allergies  Allergen Reactions   Nitrofurantoin Hives and Swelling   Penicillins Swelling    Tolerated Cephalosporin Date: 10/09/19.       Social History   Tobacco Use   Smoking status:  Never   Smokeless tobacco: Never  Vaping Use   Vaping Use: Never used  Substance Use Topics   Alcohol use: Yes    Alcohol/week: 2.0 standard drinks of alcohol    Types: 2 Glasses of wine per week    Comment: occas.   Drug use: No      Chart Review Today: I personally reviewed active problem list, medication list, allergies, family history, social history, health maintenance, notes from last encounter, lab results, imaging with the patient/caregiver today.   Review of Systems  Constitutional: Negative.   HENT: Negative.    Eyes: Negative.    Respiratory: Negative.    Cardiovascular: Negative.   Gastrointestinal: Negative.   Endocrine: Negative.   Genitourinary: Negative.   Musculoskeletal: Negative.   Skin: Negative.   Allergic/Immunologic: Negative.   Neurological: Negative.   Hematological: Negative.   Psychiatric/Behavioral: Negative.    All other systems reviewed and are negative.      Objective:   Vitals:   10/27/21 0906  BP: 116/68  Pulse: 96  Resp: 18  Temp: 97.7 F (36.5 C)  TempSrc: Oral  SpO2: 99%  Weight: 163 lb 8 oz (74.2 kg)  Height: 5' 4" (1.626 m)    Body mass index is 28.06 kg/m.  Physical Exam Vitals and nursing note reviewed.  Constitutional:      General: She is not in acute distress.    Appearance: Normal appearance. She is well-developed, well-groomed and overweight. She is not ill-appearing, toxic-appearing or diaphoretic.  HENT:     Head: Normocephalic and atraumatic.     Nose: Nose normal.  Eyes:     General:        Right eye: No discharge.        Left eye: No discharge.     Conjunctiva/sclera: Conjunctivae normal.  Neck:     Trachea: No tracheal deviation.  Cardiovascular:     Rate and Rhythm: Normal rate and regular rhythm.  Pulmonary:     Effort: Pulmonary effort is normal. No respiratory distress.     Breath sounds: No stridor.  Musculoskeletal:        General: Normal range of motion.  Skin:    General: Skin is warm and dry.     Findings: No rash.     Comments: Rash to left inner thigh, slightly raised erythematous maculopapular rash some area in clusters, looks chronic, no central clearing Other areas of itching w/o rash, only dry skin   Neurological:     Mental Status: She is alert.     Motor: No abnormal muscle tone.     Coordination: Coordination normal.  Psychiatric:        Behavior: Behavior normal. Behavior is cooperative.         Results for orders placed or performed in visit on 09/21/21  POCT HgB A1C  Result Value Ref Range   Hemoglobin A1C  6.1 (A) 4.0 - 5.6 %   HbA1c POC (<> result, manual entry)     HbA1c, POC (prediabetic range)     HbA1c, POC (controlled diabetic range)         Assessment & Plan:     ICD-10-CM   1. Rash and nonspecific skin eruption  R21 triamcinolone (KENALOG) 0.025 % ointment    Ambulatory referral to Dermatology   left inner leg, raised, dry, other areas scattered with no rash but dryness and itching       Reviewed care for dry skin and atopy/eczema care - hydration may  help She can also use OTC cortisone, triamcinolone not for face Instructions given on AVS She can consult derm if not improved      Delsa Grana, PA-C 10/27/21 9:36 AM

## 2021-12-10 ENCOUNTER — Ambulatory Visit: Payer: Medicare PPO

## 2021-12-18 IMAGING — DX DG KNEE COMPLETE 4+V*R*
4 series · 4 of 4 positions shown · non-contrast
Comparison: Radiographs of the right knee, 04/20/2017

CLINICAL DATA: Knee pain

EXAM:
LEFT KNEE - COMPLETE 4+ VIEW; RIGHT KNEE - COMPLETE 4+ VIEW

[dg knee complete 4 views right (1 of 4)]
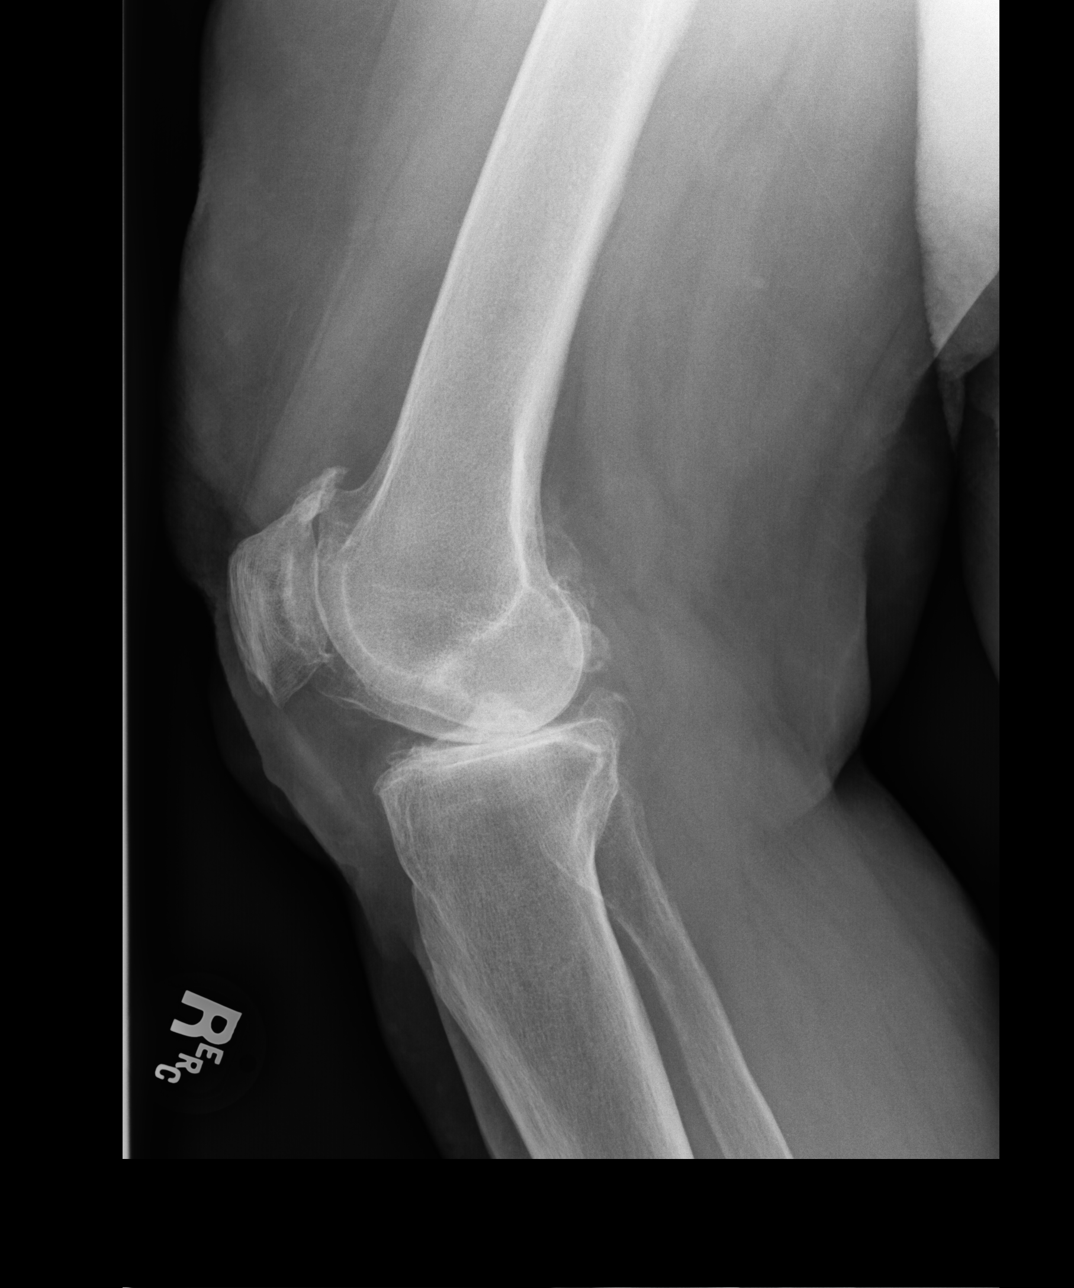

[dg knee complete 4 views right (2 of 4)]
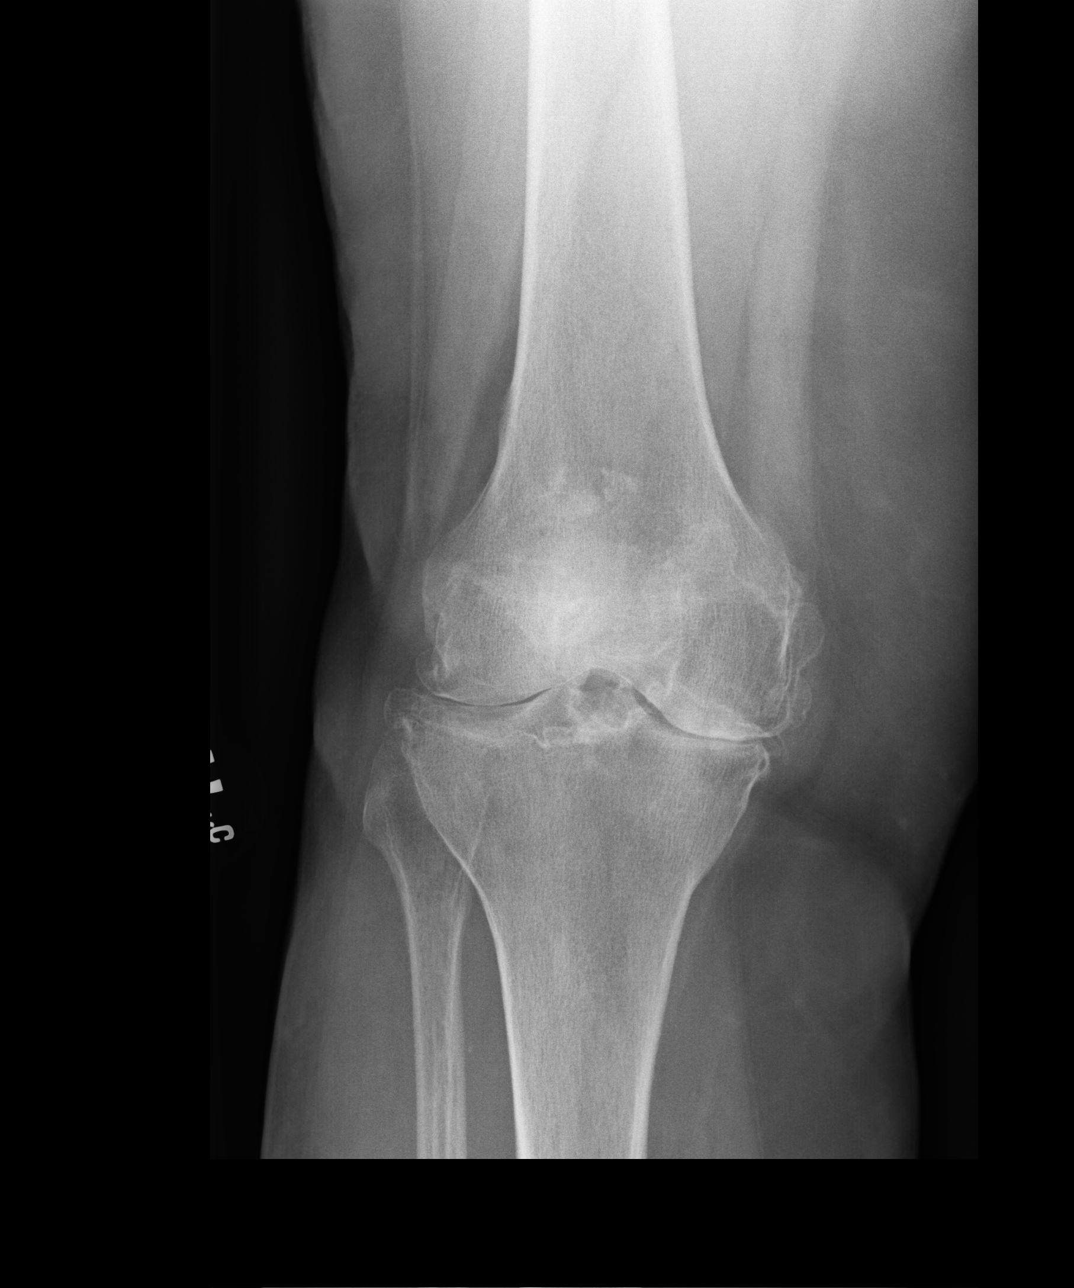

[dg knee complete 4 views right (3 of 4)]
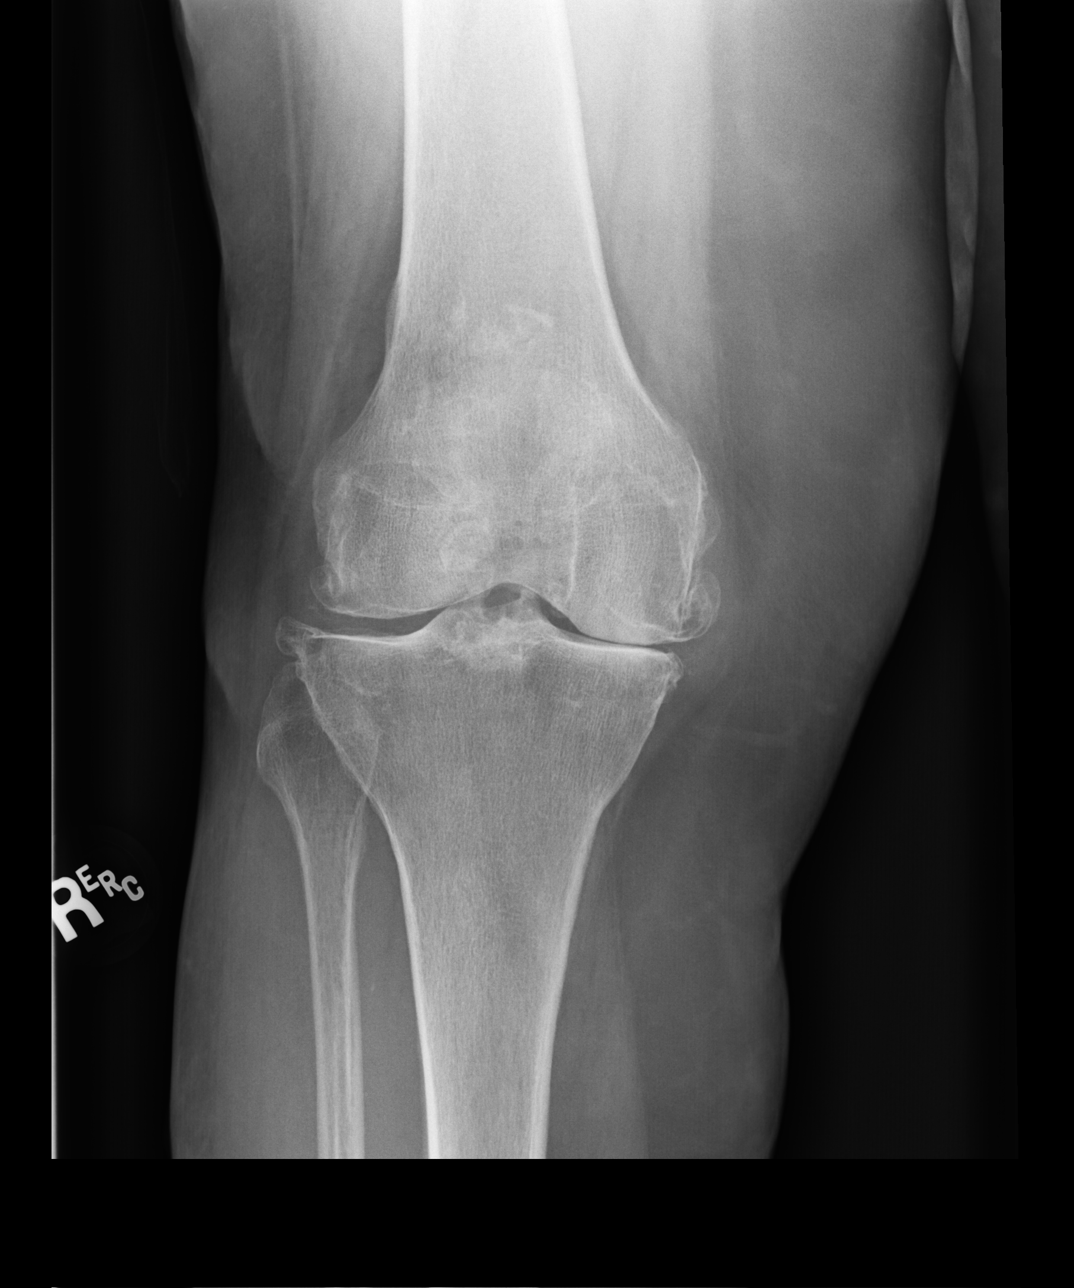

[dg knee complete 4 views right (4 of 4)]
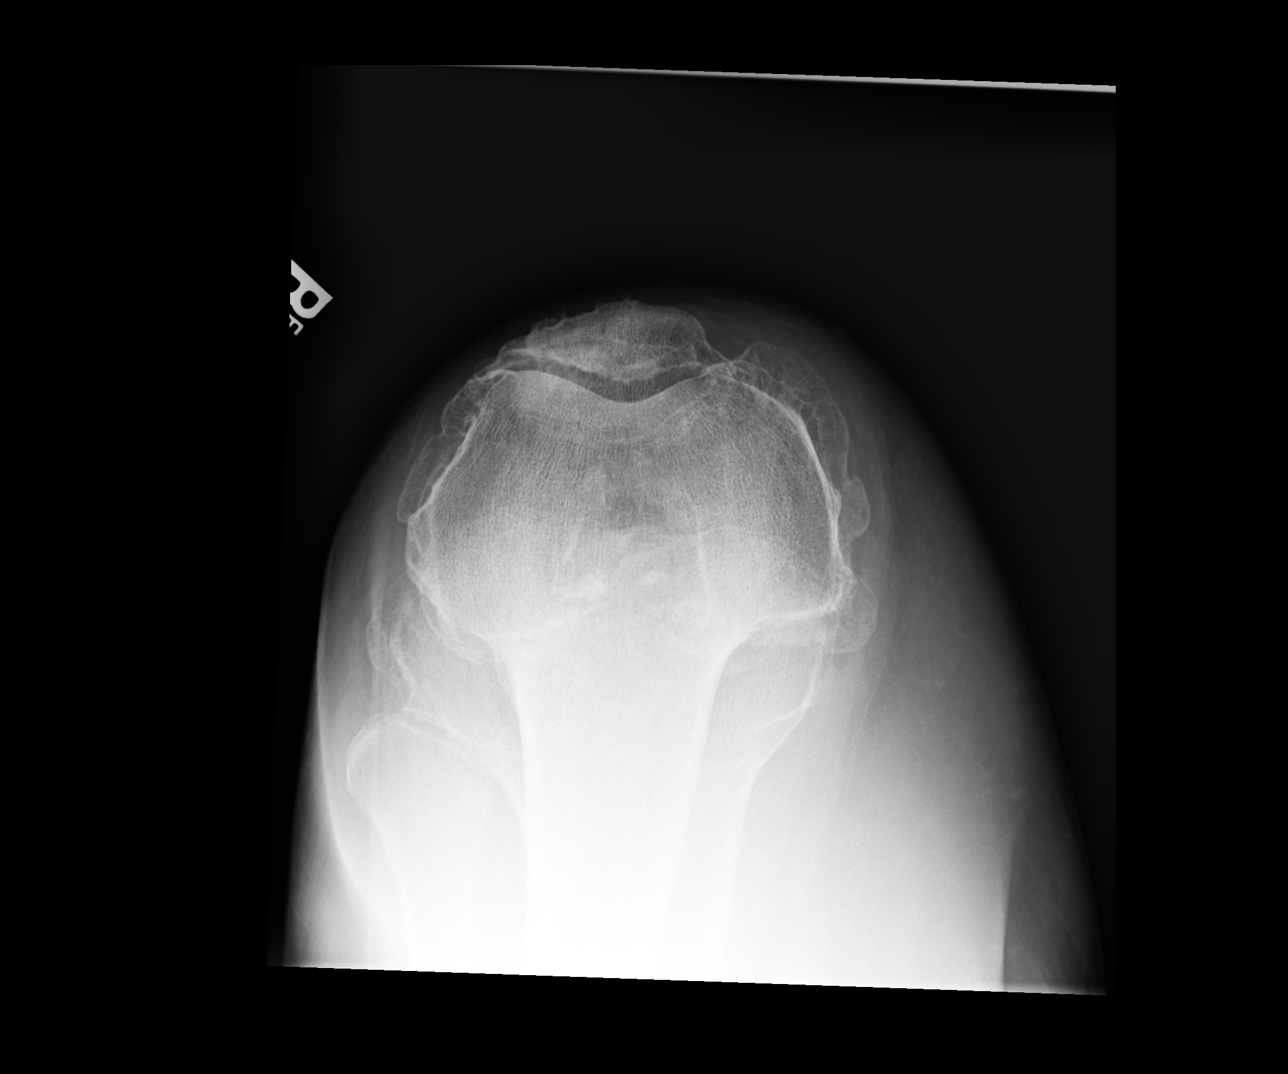

[4 of 4 positions shown; findings below may reference images not displayed]

FINDINGS: No fracture or dislocation of the bilateral knees. There is severe,
symmetric tricompartmental arthrosis, with near bone-on-bone joint
space loss of the bilateral medial compartments. Calcified loose
bodies in the posterior joint spaces of the right knee. Degenerative
findings of the right knee are not significantly changed compared to
prior examination dated 04/20/2017. No knee joint effusion. Soft
tissues are unremarkable.
IMPRESSION: Severe, symmetric tricompartmental arthrosis of the bilateral knees,
with near bone-on-bone joint space loss of the bilateral medial
compartments. Calcified loose bodies in the posterior joint spaces
of the right knee. Degenerative findings of the right knee Not
significantly changed compared to 04/20/2017.

## 2021-12-18 IMAGING — MG DIGITAL SCREENING BILAT W/ TOMO W/ CAD
8 series · 8 of 24 positions shown · non-contrast
Comparison: Previous exam(s).

CLINICAL DATA: Screening.

EXAM:
DIGITAL SCREENING BILATERAL MAMMOGRAM WITH TOMO AND CAD

[R MLO synth-2D]
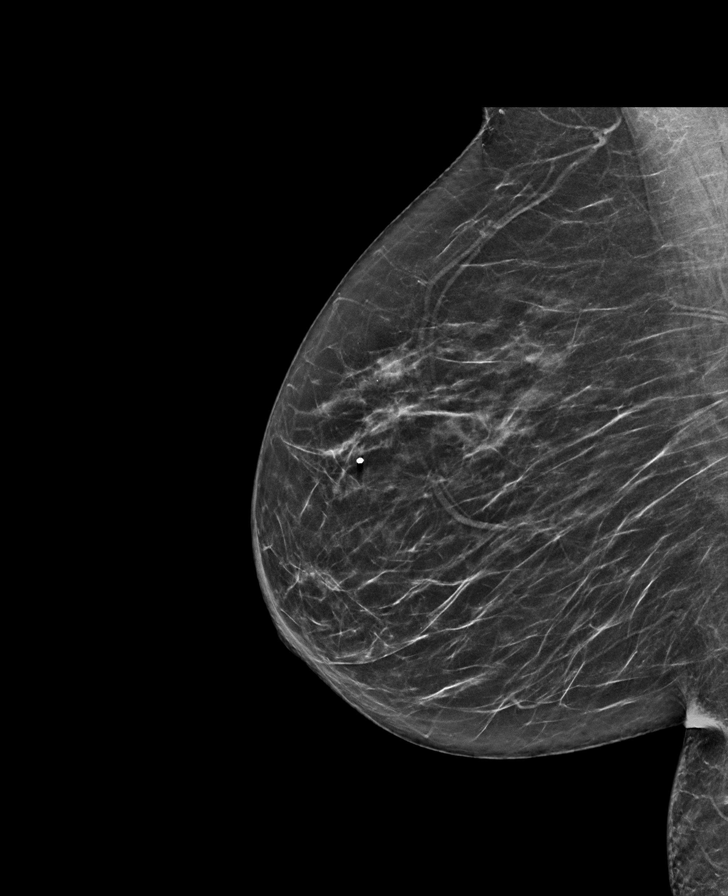

[R CC synth-2D]
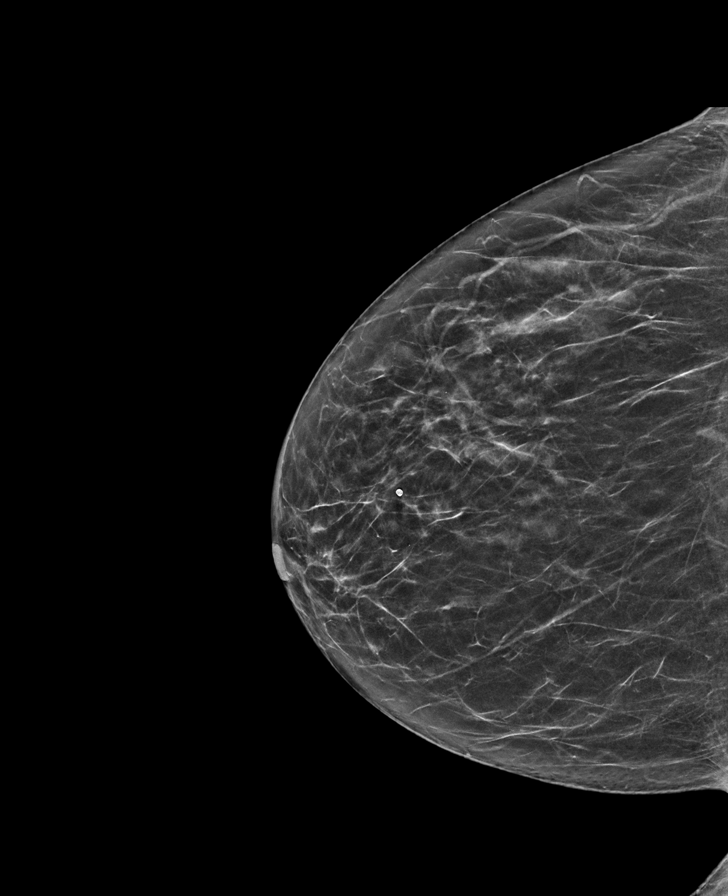

[L MLO synth-2D]
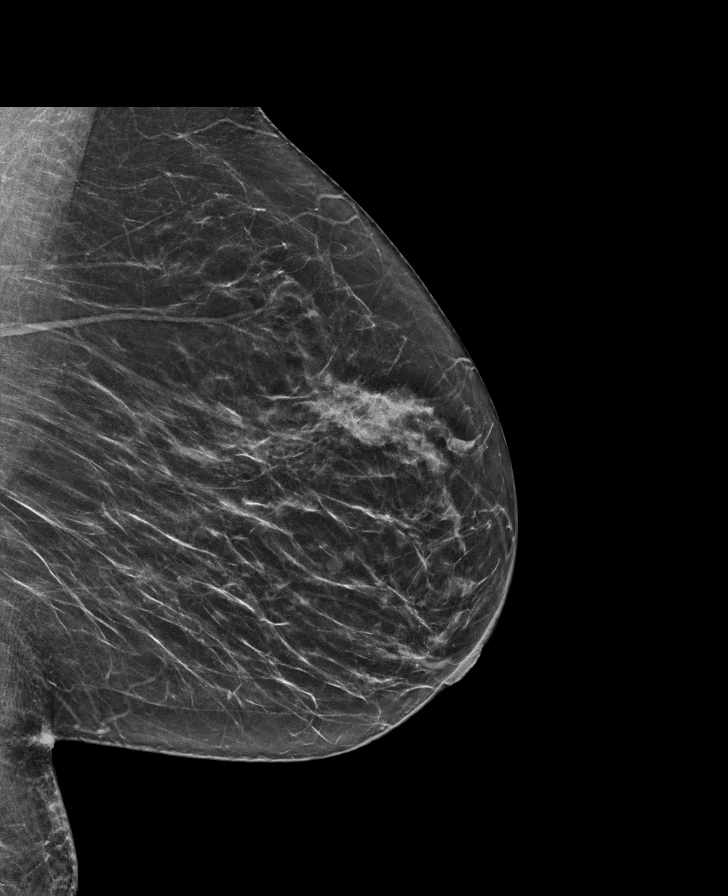

[L CC synth-2D]
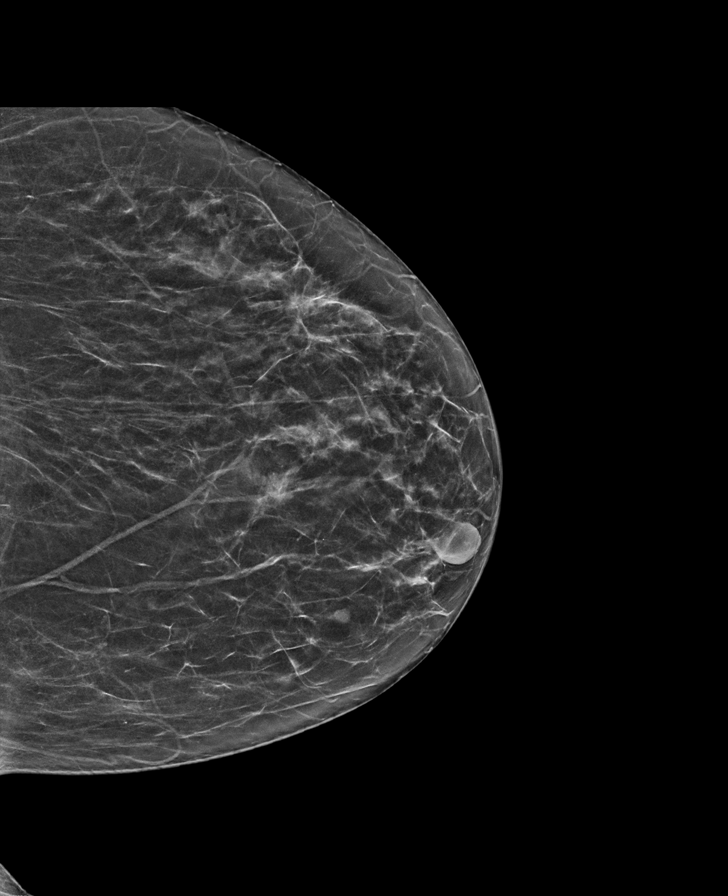

[L MLO tomo · tomo slice 31/62.0]
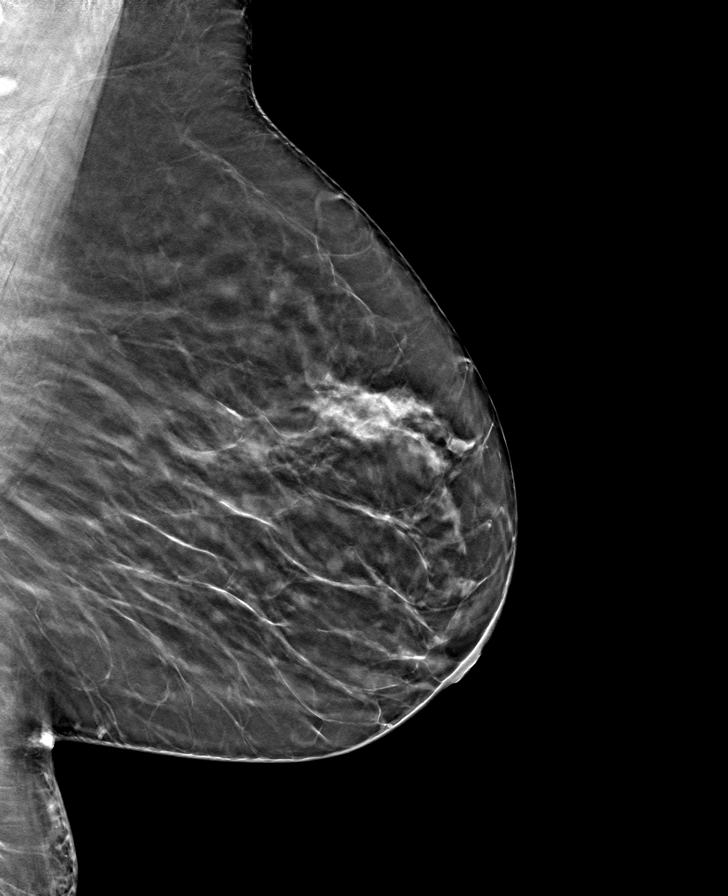

[L CC tomo · tomo slice 29/57.0]
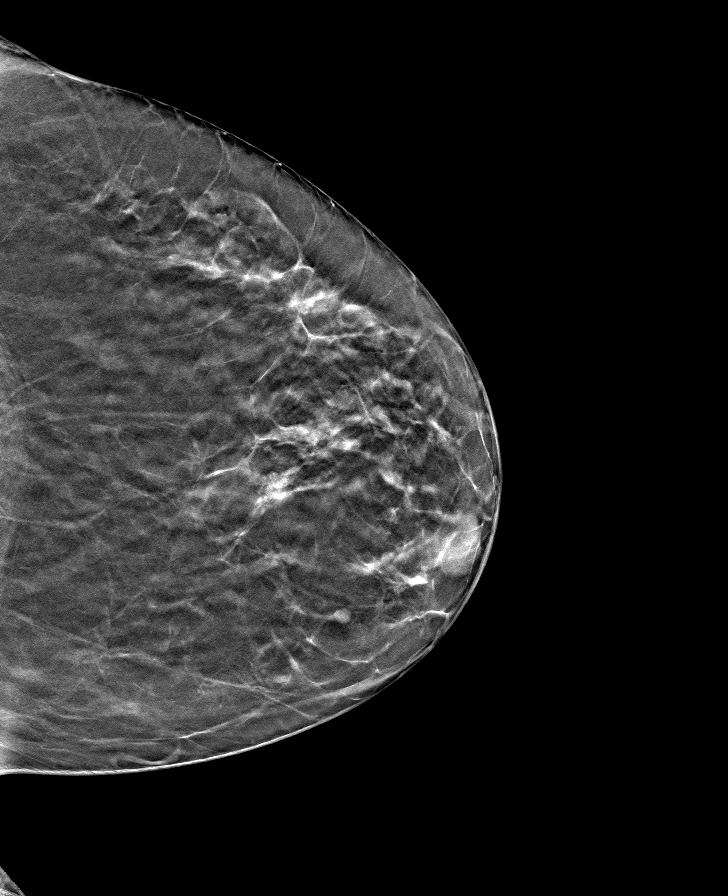

[R MLO tomo · tomo slice 30/59.0]
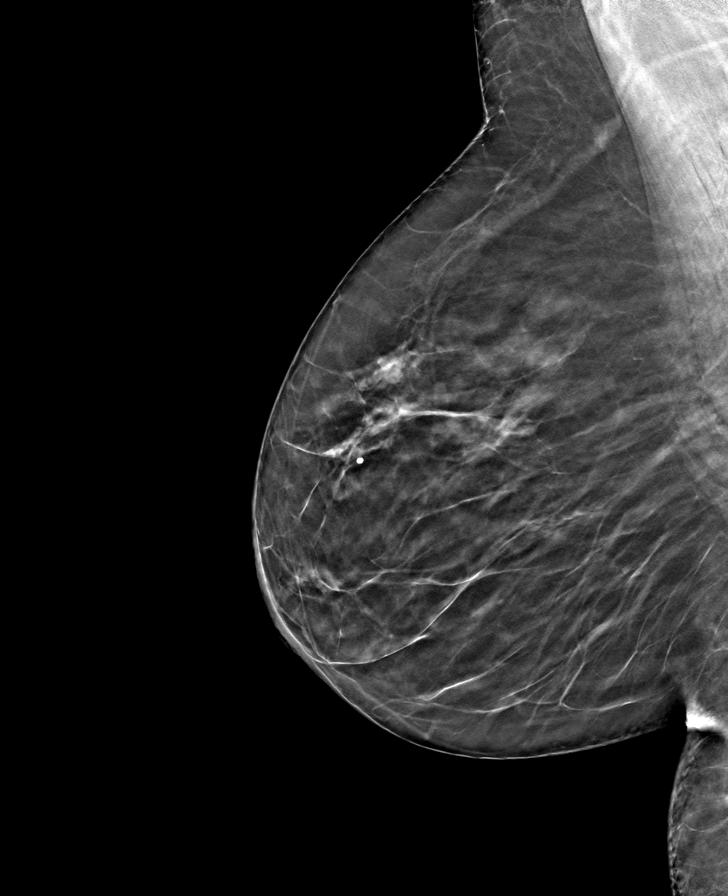

[R CC tomo · tomo slice 29/58.0]
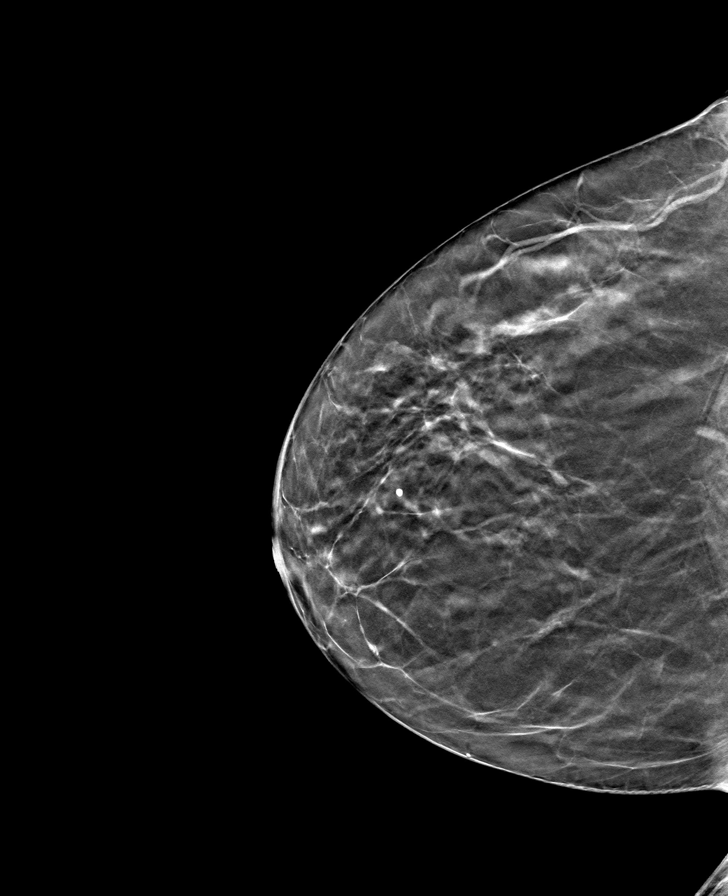

[8 of 24 positions shown; findings below may reference images not displayed]

ACR Breast Density Category b: There are scattered areas of
fibroglandular density.
FINDINGS: There are no findings suspicious for malignancy. Images were
processed with CAD.
IMPRESSION: No mammographic evidence of malignancy. A result letter of this
screening mammogram will be mailed directly to the patient.

RECOMMENDATION:
Screening mammogram in one year. (Code:[id number])

BI-RADS CATEGORY  1: Negative.

## 2022-01-18 ENCOUNTER — Ambulatory Visit: Payer: Self-pay | Admitting: *Deleted

## 2022-01-18 NOTE — Patient Outreach (Signed)
  Care Coordination   Follow Up Visit Note   01/18/2022 Name: Renee Young MRN: [id number] DOB: 15-May-1945  Renee Young is a 76 y.o. year old female who sees Danelle Berry, [place] for primary care. I spoke with  Kellie Simmering by phone today.  What matters to the patients health and wellness today?  Diabetes is well controlled, A1C of 6.1, no longer having questions regarding that.  Currently has intermittent rash and discolored skin.     Goals Addressed             This Visit's Progress    COMPLETED: care coordination activities       Care Coordination Interventions: Care Management program offered/discussed SDOH screen completed  AWV scheduled for 12/10/21 Patient denied having any social work need but would like follow up from a nurse to manage her diabetes and skin issues Patient will be going on a trip to [place], appointment with RNCM scheduled for 01/19/22 at 9am  12/4 - Case opened for care coordination     Management of chronic conditions (skin rash and knee pain)       Care Coordination Interventions: Evaluation of current treatment plan related to skin rash and knee pain with upcoming knee surgery and patient's adherence to plan as established by provider Advised patient to call ortho provider to discuss decrease in knee pain and schedule re-assessment.  She has become more active as well, which has helped. Reviewed medications with patient and discussed adherence and affordability.  Report she has been taking glucosamine chondroitin daily, which has decreased her pain. Reviewed scheduled/upcoming provider appointments including , dermatology visit scheduled for next week, AWV scheduled for 12/14 and upcoming knee surgery scheduled for 1/31 Discussed plans with patient for ongoing care management follow up and provided patient with direct contact information for care management team Screening for signs and symptoms of depression related to chronic disease state  Assessed  social determinant of health barriers         SDOH assessments and interventions completed:  No     Care Coordination Interventions:  Yes, provided   Follow up plan: Follow up call scheduled for 1/25    Encounter Outcome:  Pt. Visit Completed   Kemper Durie, RN, MSN, [facility] Care Management Care Management Coordinator [phone]

## 2022-01-18 NOTE — Patient Instructions (Signed)
Visit Information  Thank you for taking time to visit with me today. Please don't hesitate to contact me if I can be of assistance to you before our next scheduled telephone appointment.  Following are the goals we discussed today:  Call ortho to schedule appointment for reassessment regarding knee surgery. Keep dermatology appointment scheduled for next week.  Our next appointment is by telephone on 1/25  Please call the care guide team at [phone] if you need to cancel or reschedule your appointment.   Please call the Suicide and Crisis Lifeline: 988 call the [place] National Suicide Prevention Lifeline: 669-046-9594 or TTY: 819-698-7790 TTY 6786801936) to talk to a trained counselor call 1-800-273-TALK (toll free, 24 hour hotline) call 911 if you are experiencing a Mental Health or Behavioral Health Crisis or need someone to talk to.  Patient verbalizes understanding of instructions and care plan provided today and agrees to view in MyChart. Active MyChart status and patient understanding of how to access instructions and care plan via MyChart confirmed with patient.     The patient has been provided with contact information for the care management team and has been advised to call with any health related questions or concerns.   Kemper Durie, RN, MSN, [facility] Care Management Care Management Coordinator [phone]

## 2022-01-19 DIAGNOSIS — F332 Major depressive disorder, recurrent severe without psychotic features: Secondary | ICD-10-CM | POA: Diagnosis not present

## 2022-01-19 DIAGNOSIS — F411 Generalized anxiety disorder: Secondary | ICD-10-CM | POA: Diagnosis not present

## 2022-01-19 DIAGNOSIS — F902 Attention-deficit hyperactivity disorder, combined type: Secondary | ICD-10-CM | POA: Diagnosis not present

## 2022-01-27 ENCOUNTER — Ambulatory Visit (INDEPENDENT_AMBULATORY_CARE_PROVIDER_SITE_OTHER): Payer: Medicare PPO | Admitting: Physician Assistant

## 2022-01-27 ENCOUNTER — Encounter: Payer: Self-pay | Admitting: Physician Assistant

## 2022-01-27 DIAGNOSIS — Z0000 Encounter for general adult medical examination without abnormal findings: Secondary | ICD-10-CM

## 2022-01-27 NOTE — Progress Notes (Signed)
Virtual Visit via Telephone Note  I connected with  Aanchal Cope on 01/27/22 at 11:20 AM  by telephone and verified that I am speaking with the correct person using two identifiers.  Location: Patient: at home [place], [place]  Provider: [place], [place]     I discussed the limitations, risks, security and privacy concerns of performing an evaluation and management service by telephone and the availability of in person appointments. The patient expressed understanding and agreed to proceed.  Interactive audio and video telecommunications were attempted between this nurse and patient, however failed, due to patient having technical difficulties OR patient did not have access to video capability.  We continued and completed visit with audio only.  Some vital signs may be absent or patient reported.   Today's Provider: Talitha Givens, MHS, PA-C Introduced myself to the patient as a PA-C and provided education on APPs in clinical practice.     Subjective:   NANDI TONNESEN is a 76 y.o. female who presents for Medicare Annual (Subsequent) preventive examination.  Review of Systems:         Objective:     Vitals: There were no vitals taken for this visit.  There is no height or weight on file to calculate BMI.     12/04/2020    2:44 PM 12/04/2019    3:42 PM 10/08/2019    4:51 PM 09/26/2019   11:25 AM 12/24/2016   11:40 AM 03/18/2016   10:28 AM 06/25/2014    2:49 PM  Advanced Directives  Does Patient Have a Medical Advance Directive? _0  No No  Would patient like information on creating a medical advance directive? Yes (MAU/Ambulatory/Procedural Areas - Information given) Yes (MAU/Ambulatory/Procedural Areas - Information given) No - Patient declined  Yes (MAU/Ambulatory/Procedural Areas - Information given)  No - patient declined information    Tobacco Social History   Tobacco Use  Smoking Status Never  Smokeless Tobacco Never     Counseling  given: Not Answered   Clinical Intake:  Pre-visit preparation completed: Yes  Pain : No/denies pain     Nutritional Status: BMI 25 -29 Overweight Nutritional Risks: None Diabetes: Yes CBG done?: No Did pt. bring in CBG monitor from home?: No  How often do you need to have someone help you when you read instructions, pamphlets, or other written materials from your doctor or pharmacy?: 1 - Never What is the last grade level you completed in school?: Graduate School  Interpreter Needed?: No     Past Medical History:  Diagnosis Date   ADD (attention deficit disorder)    Allergy    Anemia    Arthritis    Chicken pox    Colon polyps    Depression    Diabetes mellitus without complication (HCC)    GERD (gastroesophageal reflux disease)    Kidney stones    Phlebitis    UTI (urinary tract infection)    UTI (urinary tract infection)    Past Surgical History:  Procedure Laterality Date   CESAREAN SECTION     TOTAL KNEE ARTHROPLASTY Left 10/08/2019   Procedure: TOTAL KNEE ARTHROPLASTY;  Surgeon: Gaynelle Arabian, MD;  Location: [facility];  Service: Orthopedics;  Laterality: Left;   TUBAL LIGATION     URETEROCELE INCISION     VASCULAR SURGERY     varicose vein laser surgery   Family History  Problem Relation Age of Onset   Cancer Mother    Kidney disease  Mother    Diabetes Father    Heart disease Father    Hyperlipidemia Father    Hypertension Father    Stroke Father    Arthritis Sister    Obesity Brother    Arthritis Brother    Heart disease Maternal Grandmother    Stroke Maternal Grandmother    Hypertension Maternal Grandmother    Heart disease Maternal Grandfather    Hyperlipidemia Maternal Grandfather    Stroke Paternal Grandmother    Heart disease Paternal Grandmother    Heart disease Paternal Grandfather    Heart attack Paternal Grandfather    Arthritis Daughter    Social History   Socioeconomic History   Marital status: Married    Spouse name: Not on  file   Number of children: Not on file   Years of education: Not on file   Highest education level: Not on file  Occupational History   Not on file  Tobacco Use   Smoking status: Never   Smokeless tobacco: Never  Vaping Use   Vaping Use: Never used  Substance and Sexual Activity   Alcohol use: Yes    Alcohol/week: 3.0 standard drinks of alcohol    Types: 3 Glasses of wine per week    Comment: occas.   Drug use: No   Sexual activity: Yes  Other Topics Concern   Not on file  Social History Narrative   Married   Retired Programmer, applications   Social Determinants of Health   Financial Resource Strain: [place]  (12/04/2020)   Overall Financial Resource Strain (CARDIA)    Difficulty of Paying Living Expenses: Not hard at all  Food Insecurity: No Food Insecurity (10/16/2021)   Hunger Vital Sign    Worried About Running Out of Food in the Last Year: Never true    Ran Out of Food in the Last Year: Never true  Transportation Needs: No Transportation Needs (10/16/2021)   PRAPARE - Hydrologist (Medical): No    Lack of Transportation (Non-Medical): No  Physical Activity: Sufficiently Active (10/16/2021)   Exercise Vital Sign    Days of Exercise per Week: 5 days    Minutes of Exercise per Session: 60 min  Recent Concern: Physical Activity - Insufficiently Active (10/16/2021)   Exercise Vital Sign    Days of Exercise per Week: 5 days    Minutes of Exercise per Session: 20 min  Stress: No Stress Concern Present (10/16/2021)   [place]    Feeling of Stress : Not at all  Social Connections: Moderately Isolated (12/04/2020)   Social Connection and Isolation Panel [NHANES]    Frequency of Communication with Friends and Family: More than three times a week    Frequency of Social Gatherings with Friends and Family: More than three times a week    Attends Religious Services: Never    Corporate treasurer or Organizations: No    Attends Archivist Meetings: Never    Marital Status: Married    Outpatient Encounter Medications as of 01/27/2022  Medication Sig   ACCU-CHEK GUIDE test strip    Accu-Chek Softclix Lancets lancets    amphetamine-dextroamphetamine (ADDERALL) 30 MG tablet Take 1 tablet by mouth 2 (two) times daily.   blood glucose meter kit and supplies KIT Dispense based on patient and insurance preference. Use up to four times daily as directed. (FOR ICD-9 250.00, 250.01).   cholecalciferol (VITAMIN D)  1000 units tablet Take 1,000 Units by mouth daily.   Echinacea 125 MG CAPS Take by mouth.   FLUoxetine (PROZAC) 20 MG capsule Take 40 mg by mouth at bedtime.    Red Yeast Rice Extract (RED YEAST RICE PO) Take by mouth.   sitaGLIPtin (JANUVIA) 25 MG tablet Take 1 tablet (25 mg total) by mouth daily.   triamcinolone (KENALOG) 0.025 % ointment Apply 1 Application topically 2 (two) times daily as needed (raised itchy patches and rash).   amphetamine-dextroamphetamine (ADDERALL) 20 MG tablet Take 20 mg by mouth daily.  (Patient not taking: Reported on 10/27/2021)   hydrocortisone valerate cream (WESTCORT) 0.2 % Apply 1 application. topically 2 (two) times daily. (Patient not taking: Reported on 10/27/2021)   LORazepam (ATIVAN) 0.5 MG tablet Take 1-2 tablets (0.5-1 mg total) by mouth every 8 (eight) hours as needed for anxiety or sleep (take for plane ride). (Patient not taking: Reported on 10/27/2021)   No facility-administered encounter medications on file as of 01/27/2022.    Activities of Daily Living    01/27/2022   11:35 AM 01/27/2022   11:21 AM  In your present state of health, do you have any difficulty performing the following activities:  Hearing? 0 0  Vision? 0 0  Difficulty concentrating or making decisions? 0 0  Walking or climbing stairs? 0 0  Dressing or bathing? 0 0  Doing errands, shopping? 0 0  Preparing Food and eating ? N   Using the Toilet? N    In the past six months, have you accidently leaked urine? N   Do you have problems with loss of bowel control? N   Managing your Medications? N   Managing your Finances? N   Housekeeping or managing your Housekeeping? N     Patient Care Team: Delsa Grana, PA-C as PCP - General (Family Medicine) Laurence Aly, OD (Optometry) [place], [place] as [facility]    Assessment:   This is a routine wellness examination for Tatia.  Exercise Activities and Dietary recommendations Type of exercise: Other - see comments;walking (She walks, hikes, gardens, and uses exercise bike), Time (Minutes): 30, Frequency (Times/Week): 4, Weekly Exercise (Minutes/Week): 120   Goals Addressed   None     Fall Risk:    01/27/2022   11:21 AM 10/27/2021    9:05 AM 09/21/2021    2:01 PM 06/12/2021    9:01 AM 12/15/2020    9:02 AM  Fall Risk   Falls in the past year? 0 1 0 1 0  Number falls in past yr: 0 0 0 1 0  Injury with Fall? 0 0 0 0 0  Risk for fall due to : No Fall Risks Impaired balance/gait;Impaired mobility No Fall Risks No Fall Risks No Fall Risks  Follow up Falls prevention discussed;Education provided;Falls evaluation completed Falls prevention discussed;Education provided Falls prevention discussed;Education provided Falls prevention discussed Falls prevention discussed    FALL RISK PREVENTION PERTAINING TO THE HOME:  Any stairs in or around the home? Yes  If so, are there any without handrails? Yes   Home free of loose throw rugs in walkways, pet beds, electrical cords, etc? Yes  Adequate lighting in your home to reduce risk of falls? Yes   ASSISTIVE DEVICES UTILIZED TO PREVENT FALLS:  Life alert? No  Use of a cane, walker or w/c? No  Grab bars in the bathroom? Yes  Shower chair or bench in shower? Yes  Elevated toilet seat or a handicapped toilet? No   DME ORDERS:  DME order needed?  No   TIMED UP  AND GO:  Was the test performed? No Telephone visit.  Length of time to ambulate 10 feet: 0 sec.      Depression Screen    01/27/2022   11:21 AM 10/27/2021    9:05 AM 10/16/2021   10:13 AM 09/21/2021    2:01 PM  PHQ 2/9 Scores  PHQ - 2 Score 0 0 0 0  PHQ- 9 Score 0 0  0     Cognitive Function        01/27/2022   11:37 AM 12/04/2019    3:45 PM 12/24/2016   10:42 AM  6CIT Screen  What Year? 0 points 0 points 0 points  What month? 0 points 0 points 0 points  What time? 0 points 0 points 0 points  Count back from 20 0 points 0 points 0 points  Months in reverse 0 points 0 points 0 points  Repeat phrase 0 points 0 points 2 points  Total Score 0 points 0 points 2 points    Immunization History  Administered Date(s) Administered   Influenza, High Dose Seasonal PF 12/06/2017   Influenza, Seasonal, Injecte, Preservative Fre 04/05/2012   Influenza,inj,Quad PF,6+ Mos 01/07/2014, 01/06/2015, 12/24/2016   Moderna Sars-Covid-2 Vaccination 04/11/2019, 05/09/2019   Pneumococcal Conjugate-13 01/07/2014   Pneumococcal Polysaccharide-23 04/27/2012   Tdap 04/27/2012   Zoster, Live 10/03/2007    Qualifies for Shingles Vaccine? Yes  Completed need to request records  Tdap: Up to date  Flu Vaccine: Completed  Pneumococcal Vaccine: Completed  Covid-19 Vaccine: Completed vaccines  Screening Tests Health Maintenance  Topic Date Due   Diabetic kidney evaluation - Urine ACR  09/24/2020   INFLUENZA VACCINE  09/15/2021   COVID-19 Vaccine (3 - 2023-24 season) 10/16/2021   Zoster Vaccines- Shingrix (1 of 2) 04/28/2022 (Originally 03/21/1996)   DEXA SCAN  01/28/2023 (Originally 03/22/2011)   COLONOSCOPY (Pts 45-31yr Insurance coverage will need to be confirmed)  01/28/2023 (Originally 12/28/2018)   HEMOGLOBIN A1C  03/24/2022   DTaP/Tdap/Td (2 - Td or Tdap) 04/28/2022   MAMMOGRAM  05/21/2022   FOOT EXAM  06/13/2022   Diabetic kidney evaluation - eGFR measurement  06/16/2022    OPHTHALMOLOGY EXAM  10/14/2022   Medicare Annual Wellness (AWV)  01/28/2023   Pneumonia Vaccine 75 Years old  Completed   Hepatitis C Screening  Completed   HPV VACCINES  Aged Out    Cancer Screenings:  Colorectal Screening: Declined  Mammogram: Completed 05/20/2021. Abnormal  Bone Density: Declined  Lung Cancer Screening: (Low Dose CT Chest recommended if Age 49-80 years, 30 pack-year currently smoking OR have quit w/in 15years.) does not qualify.     Additional Screening:  Hepatitis C Screening: does qualify; Completed 06/10/2020  Vision Screening: Recommended annual ophthalmology exams for early detection of glaucoma and other disorders of the eye. Is the patient up to date with their annual eye exam?  Yes  Who is the provider or what is the name of the office in which the pt attends annual eye exams? [facility]   Dental Screening: Recommended annual dental exams for proper oral hygiene  Community Resource Referral:  CRR required this visit?  No       Plan:  I have personally reviewed and addressed the Medicare Annual Wellness questionnaire and have noted the following in the patient's chart:  A. Medical and social history B. Use of  alcohol, tobacco or illicit drugs  C. Current medications and supplements D. Functional ability and status E.  Nutritional status F.  Physical activity G. Advance directives H. List of other physicians I.  Hospitalizations, surgeries, and ER visits in previous 12 months J.  [place] such as hearing and vision if needed, cognitive and depression L. Referrals and appointments   In addition, I have reviewed and discussed with patient certain preventive protocols, quality metrics, and best practice recommendations. A written personalized care plan for preventive services as well as general preventive health recommendations were provided to patient.  Signed,    Talitha Givens, MHS, PA-C [facility]

## 2022-01-27 NOTE — Patient Instructions (Signed)
Renee Young , Thank you for taking time to come for your Medicare Wellness Visit. I appreciate your ongoing commitment to your health goals. Please review the following plan we discussed and let me know if I can assist you in the future.   Screening recommendations/referrals: Colonoscopy: Declined  Mammogram: Please complete in 2024 Bone Density: Please let us know if you would like to complete this in the future  Recommended yearly ophthalmology/optometry visit for glaucoma screening and checkup Recommended yearly dental visit for hygiene and checkup   Advanced directives: Not completed- information attached to your AVS  Conditions/risks identified: None  Next appointment: Follow up in one year for your annual wellness visit    Preventive Care 65 Years and Older, Female Preventive care refers to lifestyle choices and visits with your health care provider that can promote health and wellness. What does preventive care include? A yearly physical exam. This is also called an annual well check. Dental exams once or twice a year. Routine eye exams. Ask your health care provider how often you should have your eyes checked. Personal lifestyle choices, including: Daily care of your teeth and gums. Regular physical activity. Eating a healthy diet. Avoiding tobacco and drug use. Limiting alcohol use. Practicing safe sex. Taking low-dose aspirin every day. Taking vitamin and mineral supplements as recommended by your health care provider. What happens during an annual well check? The services and screenings done by your health care provider during your annual well check will depend on your age, overall health, lifestyle risk factors, and family history of disease. Counseling  Your health care provider may ask you questions about your: Alcohol use. Tobacco use. Drug use. Emotional well-being. Home and relationship well-being. Sexual activity. Eating habits. History of falls. Memory  and ability to understand (cognition). Work and work Astronomer. Reproductive health. Screening  You may have the following tests or measurements: Height, weight, and BMI. Blood pressure. Lipid and cholesterol levels. These may be checked every 5 years, or more frequently if you are over 51 years old. Skin check. Lung cancer screening. You may have this screening every year starting at age 55 if you have a 30-pack-year history of smoking and currently smoke or have quit within the past 15 years. Fecal occult blood test (FOBT) of the stool. You may have this test every year starting at age 21. Flexible sigmoidoscopy or colonoscopy. You may have a sigmoidoscopy every 5 years or a colonoscopy every 10 years starting at age 46. Hepatitis C blood test. Hepatitis B blood test. Sexually transmitted disease (STD) testing. Diabetes screening. This is done by checking your blood sugar (glucose) after you have not eaten for a while (fasting). You may have this done every 1-3 years. Bone density scan. This is done to screen for osteoporosis. You may have this done starting at age 81. Mammogram. This may be done every 1-2 years. Talk to your health care provider about how often you should have regular mammograms. Talk with your health care provider about your test results, treatment options, and if necessary, the need for more tests. Vaccines  Your health care provider may recommend certain vaccines, such as: Influenza vaccine. This is recommended every year. Tetanus, diphtheria, and acellular pertussis (Tdap, Td) vaccine. You may need a Td booster every 10 years. Zoster vaccine. You may need this after age 53. Pneumococcal 13-valent conjugate (PCV13) vaccine. One dose is recommended after age 49. Pneumococcal polysaccharide (PPSV23) vaccine. One dose is recommended after age 31. Talk to your health  care provider about which screenings and vaccines you need and how often you need them. This  information is not intended to replace advice given to you by your health care provider. Make sure you discuss any questions you have with your health care provider. Document Released: 02/28/2015 Document Revised: 10/22/2015 Document Reviewed: 12/03/2014 Elsevier Interactive Patient Education  2017 [place] Prevention in the Home Falls can cause injuries. They can happen to people of all ages. There are many things you can do to make your home safe and to help prevent falls. What can I do on the outside of my home? Regularly fix the edges of walkways and driveways and fix any cracks. Remove anything that might make you trip as you walk through a door, such as a raised step or threshold. Trim any bushes or trees on the path to your home. Use bright outdoor lighting. Clear any walking paths of anything that might make someone trip, such as rocks or tools. Regularly check to see if handrails are loose or broken. Make sure that both sides of any steps have handrails. Any raised decks and porches should have guardrails on the edges. Have any leaves, snow, or ice cleared regularly. Use sand or salt on walking paths during winter. Clean up any spills in your garage right away. This includes oil or grease spills. What can I do in the bathroom? Use night lights. Install grab bars by the toilet and in the tub and shower. Do not use towel bars as grab bars. Use non-skid mats or decals in the tub or shower. If you need to sit down in the shower, use a plastic, non-slip stool. Keep the floor dry. Clean up any water that spills on the floor as soon as it happens. Remove soap buildup in the tub or shower regularly. Attach bath mats securely with double-sided non-slip rug tape. Do not have throw rugs and other things on the floor that can make you trip. What can I do in the bedroom? Use night lights. Make sure that you have a light by your bed that is easy to reach. Do not use any sheets or  blankets that are too big for your bed. They should not hang down onto the floor. Have a firm chair that has side arms. You can use this for support while you get dressed. Do not have throw rugs and other things on the floor that can make you trip. What can I do in the kitchen? Clean up any spills right away. Avoid walking on wet floors. Keep items that you use a lot in easy-to-reach places. If you need to reach something above you, use a strong step stool that has a grab bar. Keep electrical cords out of the way. Do not use floor polish or wax that makes floors slippery. If you must use wax, use non-skid floor wax. Do not have throw rugs and other things on the floor that can make you trip. What can I do with my stairs? Do not leave any items on the stairs. Make sure that there are handrails on both sides of the stairs and use them. Fix handrails that are broken or loose. Make sure that handrails are as long as the stairways. Check any carpeting to make sure that it is firmly attached to the stairs. Fix any carpet that is loose or worn. Avoid having throw rugs at the top or bottom of the stairs. If you do have throw rugs, attach them to  the floor with carpet tape. Make sure that you have a light switch at the top of the stairs and the bottom of the stairs. If you do not have them, ask someone to add them for you. What else can I do to help prevent falls? Wear shoes that: Do not have high heels. Have rubber bottoms. Are comfortable and fit you well. Are closed at the toe. Do not wear sandals. If you use a stepladder: Make sure that it is fully opened. Do not climb a closed stepladder. Make sure that both sides of the stepladder are locked into place. Ask someone to hold it for you, if possible. Clearly mark and make sure that you can see: Any grab bars or handrails. First and last steps. Where the edge of each step is. Use tools that help you move around (mobility aids) if they are  needed. These include: Canes. Walkers. Scooters. Crutches. Turn on the lights when you go into a dark area. Replace any light bulbs as soon as they burn out. Set up your furniture so you have a clear path. Avoid moving your furniture around. If any of your floors are uneven, fix them. If there are any pets around you, be aware of where they are. Review your medicines with your doctor. Some medicines can make you feel dizzy. This can increase your chance of falling. Ask your doctor what other things that you can do to help prevent falls. This information is not intended to replace advice given to you by your health care provider. Make sure you discuss any questions you have with your health care provider. Document Released: 11/28/2008 Document Revised: 07/10/2015 Document Reviewed: 03/08/2014 Elsevier Interactive Patient Education  2017 [employer].

## 2022-01-28 ENCOUNTER — Ambulatory Visit: Payer: Medicare PPO

## 2022-02-02 DIAGNOSIS — R21 Rash and other nonspecific skin eruption: Secondary | ICD-10-CM | POA: Diagnosis not present

## 2022-02-02 DIAGNOSIS — L818 Other specified disorders of pigmentation: Secondary | ICD-10-CM | POA: Diagnosis not present

## 2022-02-03 ENCOUNTER — Telehealth: Payer: Self-pay | Admitting: Nurse Practitioner

## 2022-02-03 DIAGNOSIS — E1165 Type 2 diabetes mellitus with hyperglycemia: Secondary | ICD-10-CM

## 2022-02-03 NOTE — Telephone Encounter (Signed)
Unable to refill per protocol, request is too soon. Last refill 09/21/21 for 90 and 1 refill. Will refuse.  Requested Prescriptions  Pending Prescriptions Disp Refills   JANUVIA 25 MG tablet [Pharmacy Med Name: JANUVIA 25 MG TABLET] 90 tablet 1    Sig: TAKE 1 TABLET (25 MG TOTAL) BY MOUTH DAILY.     Endocrinology:  Diabetes - DPP-4 Inhibitors Passed - 02/03/2022  1:27 PM      Passed - HBA1C is between 0 and 7.9 and within 180 days    Hemoglobin A1C  Date Value Ref Range Status  09/21/2021 6.1 (A) 4.0 - 5.6 % Final   Hgb A1c MFr Bld  Date Value Ref Range Status  06/15/2021 5.9 (H) <5.7 % of total Hgb Final    Comment:    For someone without known diabetes, a hemoglobin  A1c value between 5.7% and 6.4% is consistent with prediabetes and should be confirmed with a  follow-up test. . For someone with known diabetes, a value <7% indicates that their diabetes is well controlled. A1c targets should be individualized based on duration of diabetes, age, comorbid conditions, and other considerations. . This assay result is consistent with an increased risk of diabetes. . Currently, no consensus exists regarding use of hemoglobin A1c for diagnosis of diabetes for children. .          Passed - Cr in normal range and within 360 days    Creat  Date Value Ref Range Status  06/15/2021 0.81 0.60 - 1.00 mg/dL Final   Creatinine,U  Date Value Ref Range Status  03/07/2009 145.4 mg/dL Final   Creatinine, Urine  Date Value Ref Range Status  09/25/2019 108 20 - 275 mg/dL Final         Passed - Valid encounter within last 6 months    Recent Outpatient Visits           1 week ago Encounter for [employer] annual wellness exam   [facility] Mecum, Erin E, PA-C   3 months ago Rash and nonspecific skin eruption   [facility] [place], [place], PA-C   4 months ago Type 2 diabetes mellitus with hyperglycemia, without long-term current use of insulin [facility])    [facility] Della Goo F, FNP   7 months ago Type 2 diabetes mellitus with hyperglycemia, without long-term current use of insulin [facility])   [facility] Mecum, Erin E, PA-C   1 year ago Type 2 diabetes mellitus with hyperglycemia, without long-term current use of insulin [facility])   [facility] Danelle Berry, [place]

## 2022-03-11 ENCOUNTER — Ambulatory Visit: Payer: Self-pay | Admitting: *Deleted

## 2022-03-11 NOTE — Patient Outreach (Signed)
  Care Coordination   Follow Up Visit Note   03/11/2022 Name: Renee Young MRN: [id number] DOB: 06/05/1945  Renee Young is a 77 y.o. year old female who sees Renee Young, [place] for primary care. I spoke with  Renee Young by phone today.  What matters to the patients health and wellness today?  When does my A1C need to be done again?    Goals Addressed             This Visit's Progress    COMPLETED: Management of chronic conditions (skin rash and knee pain)   On track    Care Coordination Interventions: Evaluation of current treatment plan related to skin rash and knee pain with upcoming knee surgery and patient's adherence to plan as established by provider Advised patient to call PCP office to schedule office visit. Reviewed medications with patient and discussed adherence and affordability.  Report she has been taking glucosamine chondroitin daily, which has decreased her pain. Reviewed scheduled/upcoming provider appointments including , need for PCP follow up for next month for DM management and A1C recheck.  Upcoming knee surgery scheduled for 1/31 now canceled as patient report pain is better and she does not want surgery recover to interfere with her scheduled trip to [place] this spring Discussed plans with patient for ongoing care management follow up and provided patient with direct contact information for care management team Report rash has disappeared, no further skin issues         SDOH assessments and interventions completed:  No     Care Coordination Interventions:  Yes, provided   Follow up plan: No further intervention required.   Encounter Outcome:  Pt. Visit Completed   Valente David, RN, MSN, [facility] Care Management Care Management Coordinator [phone]

## 2022-03-11 NOTE — Patient Instructions (Signed)
Visit Information  Thank you for taking time to visit with me today. Please don't hesitate to contact me if I can be of assistance to you.  Following are the goals we discussed today:  Call PCP office to schedule follow up for next month.  Should have another A1C done at that time.   Please call the Suicide and Crisis Lifeline: 988 call the [place] National Suicide Prevention Lifeline: 863-478-1763 or TTY: 725-572-1761 TTY 702-756-6183) to talk to a trained counselor call 1-800-273-TALK (toll free, 24 hour hotline) call 911 if you are experiencing a Mental Health or [place] or need someone to talk to.  Patient verbalizes understanding of instructions and care plan provided today and agrees to view in [place]. Active MyChart status and patient understanding of how to access instructions and care plan via MyChart confirmed with patient.     The patient has been provided with contact information for the care management team and has been advised to call with any health related questions or concerns.   Valente David, RN, MSN, [place] Care Management Care Management Coordinator [phone]

## 2022-03-17 ENCOUNTER — Ambulatory Visit: Admit: 2022-03-17 | Payer: Medicare PPO | Admitting: Orthopedic Surgery

## 2022-03-17 SURGERY — ARTHROPLASTY, KNEE, TOTAL
Anesthesia: Choice | Site: Knee | Laterality: Right

## 2022-04-12 ENCOUNTER — Other Ambulatory Visit: Payer: Self-pay | Admitting: Family Medicine

## 2022-04-12 DIAGNOSIS — Z1231 Encounter for screening mammogram for malignant neoplasm of breast: Secondary | ICD-10-CM

## 2022-04-13 ENCOUNTER — Encounter: Payer: Self-pay | Admitting: Family Medicine

## 2022-04-13 ENCOUNTER — Ambulatory Visit: Payer: Medicare PPO | Admitting: Family Medicine

## 2022-04-13 VITALS — BP 134/72 | HR 92 | Temp 98.0°F | Resp 16 | Ht 64.0 in | Wt 164.9 lb

## 2022-04-13 DIAGNOSIS — F325 Major depressive disorder, single episode, in full remission: Secondary | ICD-10-CM

## 2022-04-13 DIAGNOSIS — F988 Other specified behavioral and emotional disorders with onset usually occurring in childhood and adolescence: Secondary | ICD-10-CM | POA: Diagnosis not present

## 2022-04-13 DIAGNOSIS — Z5181 Encounter for therapeutic drug level monitoring: Secondary | ICD-10-CM | POA: Diagnosis not present

## 2022-04-13 DIAGNOSIS — Z780 Asymptomatic menopausal state: Secondary | ICD-10-CM | POA: Diagnosis not present

## 2022-04-13 DIAGNOSIS — R718 Other abnormality of red blood cells: Secondary | ICD-10-CM | POA: Diagnosis not present

## 2022-04-13 DIAGNOSIS — F419 Anxiety disorder, unspecified: Secondary | ICD-10-CM

## 2022-04-13 DIAGNOSIS — E782 Mixed hyperlipidemia: Secondary | ICD-10-CM | POA: Diagnosis not present

## 2022-04-13 DIAGNOSIS — E1165 Type 2 diabetes mellitus with hyperglycemia: Secondary | ICD-10-CM | POA: Diagnosis not present

## 2022-04-13 DIAGNOSIS — F3341 Major depressive disorder, recurrent, in partial remission: Secondary | ICD-10-CM | POA: Diagnosis not present

## 2022-04-13 NOTE — Progress Notes (Signed)
Name: Renee Young   MRN: [id number]    DOB: 1945/09/22   Date:04/13/2022       Progress Note  Chief Complaint  Patient presents with   Follow-up   Diabetes   Hyperlipidemia   Depression   Anxiety     Subjective:   Renee Young is a 77 y.o. female, presents to clinic for routine f/up She is going out of the country May to Aug to be with her daughter and new grandbaby  [facility] - adderall and fluoxetine  Mood and ADHD doing well with current management She denies any SE or concerns with meds, HR and BP in normal range  DM:   Managed on [place] only for the past 2 years  Did different meds back in the 90's  Reports good med compliance Pt has no SE from meds. Blood sugars not checking  Denies: Polyuria, polydipsia, vision changes, neuropathy, hypoglycemia Recent pertinent labs: Lab Results  Component Value Date   HGBA1C 6.1 (A) 09/21/2021   HGBA1C 5.9 (H) 06/15/2021   HGBA1C 5.9 (H) 12/15/2020   Lab Results  Component Value Date   MICROALBUR 2.1 12/15/2020   LDLCALC 148 (H) 06/15/2021   CREATININE 0.81 06/15/2021   Standard of care and health maintenance: Urine Microalbumin:  due Foot exam:  due April - done today DM eye exam:  UTD ACEI/ARB:  no Statin:  no     Current Outpatient Medications:    ACCU-CHEK GUIDE test strip, , Disp: , Rfl:    Accu-Chek Softclix Lancets lancets, , Disp: , Rfl:    amphetamine-dextroamphetamine (ADDERALL) 20 MG tablet, Take 20 mg by mouth daily., Disp: , Rfl: 0   amphetamine-dextroamphetamine (ADDERALL) 30 MG tablet, Take 1 tablet by mouth 2 (two) times daily., Disp: , Rfl:    blood glucose meter kit and supplies KIT, Dispense based on patient and insurance preference. Use up to four times daily as directed. (FOR ICD-9 250.00, 250.01)., Disp: 1 each, Rfl: 0   cholecalciferol (VITAMIN D) 1000 units tablet, Take 1,000 Units by mouth daily., Disp: , Rfl:    FLUoxetine (PROZAC) 20 MG capsule, Take 40 mg by  mouth at bedtime. , Disp: , Rfl:    Red Yeast Rice Extract (RED YEAST RICE PO), Take by mouth., Disp: , Rfl:    sitaGLIPtin (JANUVIA) 25 MG tablet, Take 1 tablet (25 mg total) by mouth daily., Disp: 90 tablet, Rfl: 1   triamcinolone (KENALOG) 0.025 % ointment, Apply 1 Application topically 2 (two) times daily as needed (raised itchy patches and rash)., Disp: 15 g, Rfl: 1   Echinacea 125 MG CAPS, Take by mouth. (Patient not taking: Reported on 04/13/2022), Disp: , Rfl:    hydrocortisone valerate cream (WESTCORT) 0.2 %, Apply 1 application. topically 2 (two) times daily. (Patient not taking: Reported on 04/13/2022), Disp: 45 g, Rfl: 0   LORazepam (ATIVAN) 0.5 MG tablet, Take 1-2 tablets (0.5-1 mg total) by mouth every 8 (eight) hours as needed for anxiety or sleep (take for plane ride). (Patient not taking: Reported on 04/13/2022), Disp: 20 tablet, Rfl: 0  Patient Active Problem List   Diagnosis Date Noted   MDD (major depressive disorder), recurrent, in partial remission ([place]) 01/06/2021   S/P total knee arthroplasty 10/08/2019   Tachycardia 09/25/2019   Hypochromic erythrocytes 09/24/2019   Microcytic erythrocytes 09/24/2019   Prediabetes 05/30/2019   Family history of thyroid disease 05/30/2019   Primary osteoarthritis of both knees 05/30/2019   Bilateral temporomandibular joint  pain 05/30/2019   History of colonic polyps 12/17/2016   Varicose veins of bilateral lower extremities with other complications [id number]   Pain in limb 07/03/2012   OA (osteoarthritis) of knee 03/21/2009   Type 2 diabetes mellitus with hyperglycemia, without long-term current use of insulin ([place]) 07/19/2007   Mixed hyperlipidemia 07/19/2007   DEPRESSION 07/19/2007   ATTENTION DEFICIT DISORDER 07/19/2007    Past Surgical History:  Procedure Laterality Date   CESAREAN SECTION     TOTAL KNEE ARTHROPLASTY Left 10/08/2019   Procedure: TOTAL KNEE ARTHROPLASTY;  Surgeon: Gaynelle Arabian, MD;  Location: [facility];   Service: Orthopedics;  Laterality: Left;   TUBAL LIGATION     URETEROCELE INCISION     VASCULAR SURGERY     varicose vein laser surgery    Family History  Problem Relation Age of Onset   Cancer Mother    Kidney disease Mother    Diabetes Father    Heart disease Father    Hyperlipidemia Father    Hypertension Father    Stroke Father    Arthritis Sister    Obesity Brother    Arthritis Brother    Heart disease Maternal Grandmother    Stroke Maternal Grandmother    Hypertension Maternal Grandmother    Heart disease Maternal Grandfather    Hyperlipidemia Maternal Grandfather    Stroke Paternal Grandmother    Heart disease Paternal Grandmother    Heart disease Paternal Grandfather    Heart attack Paternal Grandfather    Arthritis Daughter     Social History   Tobacco Use   Smoking status: Never   Smokeless tobacco: Never  Vaping Use   Vaping Use: Never used  Substance Use Topics   Alcohol use: Yes    Alcohol/week: 3.0 standard drinks of alcohol    Types: 3 Glasses of wine per week    Comment: occas.   Drug use: No     Allergies  Allergen Reactions   Nitrofurantoin Hives and Swelling   Penicillins Swelling    Tolerated Cephalosporin Date: 10/09/19.      Health Maintenance  Topic Date Due   Diabetic kidney evaluation - Urine ACR  09/24/2020   HEMOGLOBIN A1C  03/24/2022   Zoster Vaccines- Shingrix (2 of 2) 04/28/2022 (Originally 03/10/2021)   COVID-19 Vaccine (6 - 2023-24 season) 04/29/2022 (Originally 11/26/2021)   INFLUENZA VACCINE  05/16/2022 (Originally 09/15/2021)   DEXA SCAN  01/28/2023 (Originally 03/22/2011)   COLONOSCOPY (Pts 45-36yr Insurance coverage will need to be confirmed)  01/28/2023 (Originally 12/28/2018)   DTaP/Tdap/Td (2 - Td or Tdap) 04/28/2022   MAMMOGRAM  05/21/2022   FOOT EXAM  06/13/2022   Diabetic kidney evaluation - eGFR measurement  06/16/2022   OPHTHALMOLOGY EXAM  10/14/2022   Medicare Annual Wellness (AWV)  01/28/2023   Pneumonia  Vaccine 77 Years old  Completed   Hepatitis C Screening  Completed   HPV VACCINES  Aged Out    Chart Review Today: I personally reviewed active problem list, medication list, allergies, family history, social history, health maintenance, notes from last encounter, lab results, imaging with the patient/caregiver today.   Review of Systems  Constitutional: Negative.   HENT: Negative.    Eyes: Negative.   Respiratory: Negative.    Cardiovascular: Negative.   Gastrointestinal: Negative.   Endocrine: Negative.   Genitourinary: Negative.   Musculoskeletal: Negative.   Skin: Negative.   Allergic/Immunologic: Negative.   Neurological: Negative.   Hematological: Negative.   Psychiatric/Behavioral: Negative.    All other  systems reviewed and are negative.    Objective:   Vitals:   04/13/22 0918  BP: 134/72  Pulse: 92  Resp: 16  Temp: 98 F (36.7 C)  TempSrc: Oral  SpO2: 98%  Weight: 164 lb 14.4 oz (74.8 kg)  Height: '5\' 4"'$  (1.626 m)    Body mass index is 28.31 kg/m.  Physical Exam Vitals and nursing note reviewed.  Constitutional:      General: She is not in acute distress.    Appearance: Normal appearance. She is well-developed, well-groomed and normal weight. She is not ill-appearing, toxic-appearing or diaphoretic.  HENT:     Head: Normocephalic and atraumatic.     Right Ear: External ear normal.     Left Ear: External ear normal.  Eyes:     General:        Right eye: No discharge.        Left eye: No discharge.     Conjunctiva/sclera: Conjunctivae normal.  Cardiovascular:     Rate and Rhythm: Normal rate and regular rhythm.     Pulses: Normal pulses.     Heart sounds: Normal heart sounds. No murmur heard.    No friction rub.  Pulmonary:     Effort: Pulmonary effort is normal. No respiratory distress.     Breath sounds: Normal breath sounds. No wheezing or rales.  Abdominal:     General: Bowel sounds are normal.     Palpations: Abdomen is soft.   Musculoskeletal:     Right lower leg: No edema.     Left lower leg: No edema.  Skin:    General: Skin is warm and dry.     Capillary Refill: Capillary refill takes less than 2 seconds.     Coloration: Skin is not jaundiced or pale.     Findings: No rash.  Neurological:     Mental Status: She is alert. Mental status is at baseline.     Gait: Gait normal.  Psychiatric:        Mood and Affect: Mood normal.        Behavior: Behavior normal. Behavior is cooperative.         Assessment & Plan:   Problem List Items Addressed This Visit       Endocrine   Type 2 diabetes mellitus with hyperglycemia, without long-term current use of insulin ([place]) - Primary    Has been well controlled with diet/lifestyle efforts and currently only taking januvia No ACEI/ARB or statin - prior discussion and pt is not interested She is UTD on her DM eye exam, we will do DM foot exam today and needed labs since she will be out of the country for a while and she would like to get her next 2 refills so she has meds while out of the country Last A1C 09/2021 6.1 - well controlled no med changes Good med compliance, no SE or concerns      Relevant Orders   Microalbumin / creatinine urine ratio (Completed)   Hemoglobin A1c (Completed)   COMPLETE METABOLIC PANEL WITH GFR (Completed)     Other   Mixed hyperlipidemia    Chronic, likely genetic, pt managed with supplements, not on statin (refuses - I do not know if there has been a past attempt and failure to tolerate statins - but she has told me previously she is adamant about no statins) and she continues to work on healthy diet/lifestyle efforts and supplements like red yeast rice With DM, family hx, last  lipids - her ASCVD risk is quite high - has been reviewed with her Thankfully no concerning sx of severe atherosclerotic disease/ACS and pt is very active and healthy       Relevant Orders   Lipid panel (Completed)   COMPLETE METABOLIC PANEL WITH GFR  (Completed)   Attention deficit disorder (ADD)    On stimulants and has been for decades, managed by her specialists HR and BP in normal limits today, she has no med SE or concerns BP Readings from Last 3 Encounters:  04/13/22 134/72  10/27/21 116/68  09/21/21 118/66   Pulse Readings from Last 3 Encounters:  04/13/22 92  10/27/21 96  09/21/21 94  PDMP reviewed and med list rechecked - cleaned up - she is on quite high daily dosing and dose was recently increased from 20 mg BID to 30 mg BID adderall IR       Microcytic erythrocytes    Monitoring CBC every 6-12 months      Relevant Orders   CBC with Differential/Platelet (Completed)   MDD (major depressive disorder), recurrent, in partial remission ([place])    Managed by her specialist in [place], maintained on prozac Doing well, stable for decades as same meds, no SE or concerns    04/13/2022    9:18 AM 01/27/2022   11:21 AM 10/27/2021    9:05 AM  Depression screen PHQ 2/9  Decreased Interest 1 0 0  Down, Depressed, Hopeless 0 0 0  PHQ - 2 Score 1 0 0  Altered sleeping 0 0 0  Tired, decreased energy 0 0 0  Change in appetite 0 0 0  Feeling bad or failure about yourself  0 0 0  Trouble concentrating 0 0 0  Moving slowly or fidgety/restless 0 0 0  Suicidal thoughts 0 0 0  PHQ-9 Score 1 0 0  Difficult doing work/chores Not difficult at all Not difficult at all Not difficult at all        Other Visit Diagnoses     Postmenopausal estrogen deficiency       Relevant Orders   DG Bone Density   Encounter for medication monitoring       Relevant Orders   Microalbumin / creatinine urine ratio (Completed)   Hemoglobin A1c (Completed)   Lipid panel (Completed)   COMPLETE METABOLIC PANEL WITH GFR (Completed)   CBC with Differential/Platelet (Completed)        Return in about 6 months (around 10/12/2022) for DM f/up.   Delsa Grana, PA-C 04/13/22 10:00 AM

## 2022-04-14 LAB — CBC WITH DIFFERENTIAL/PLATELET
Absolute Monocytes: 490 cells/uL (ref 200–950)
Basophils Absolute: 40 cells/uL (ref 0–200)
Basophils Relative: 0.7 %
Eosinophils Absolute: 222 cells/uL (ref 15–500)
Eosinophils Relative: 3.9 %
HCT: 40.9 % (ref 35.0–45.0)
Hemoglobin: 13.3 g/dL (ref 11.7–15.5)
Lymphs Abs: 963 cells/uL (ref 850–3900)
MCH: 25.8 pg — ABNORMAL LOW (ref 27.0–33.0)
MCHC: 32.5 g/dL (ref 32.0–36.0)
MCV: 79.3 fL — ABNORMAL LOW (ref 80.0–100.0)
MPV: 9.1 fL (ref 7.5–12.5)
Monocytes Relative: 8.6 %
Neutro Abs: 3984 cells/uL (ref 1500–7800)
Neutrophils Relative %: 69.9 %
Platelets: 302 10*3/uL (ref 140–400)
RBC: 5.16 10*6/uL — ABNORMAL HIGH (ref 3.80–5.10)
RDW: 14.5 % (ref 11.0–15.0)
Total Lymphocyte: 16.9 %
WBC: 5.7 10*3/uL (ref 3.8–10.8)

## 2022-04-14 LAB — COMPLETE METABOLIC PANEL WITH GFR
AG Ratio: 1.4 (calc) (ref 1.0–2.5)
ALT: 14 U/L (ref 6–29)
AST: 16 U/L (ref 10–35)
Albumin: 4 g/dL (ref 3.6–5.1)
Alkaline phosphatase (APISO): 77 U/L (ref 37–153)
BUN/Creatinine Ratio: 32 (calc) — ABNORMAL HIGH (ref 6–22)
BUN: 18 mg/dL (ref 7–25)
CO2: 27 mmol/L (ref 20–32)
Calcium: 9.9 mg/dL (ref 8.6–10.4)
Chloride: 106 mmol/L (ref 98–110)
Creat: 0.57 mg/dL — ABNORMAL LOW (ref 0.60–1.00)
Globulin: 2.8 g/dL (calc) (ref 1.9–3.7)
Glucose, Bld: 94 mg/dL (ref 65–99)
Potassium: 4.8 mmol/L (ref 3.5–5.3)
Sodium: 141 mmol/L (ref 135–146)
Total Bilirubin: 0.3 mg/dL (ref 0.2–1.2)
Total Protein: 6.8 g/dL (ref 6.1–8.1)
eGFR: 94 mL/min/{1.73_m2} (ref >=60)

## 2022-04-14 LAB — MICROALBUMIN / CREATININE URINE RATIO
Creatinine, Urine: 84 mg/dL (ref 20–275)
Microalb Creat Ratio: 19 mcg/mg creat (ref <30)
Microalb, Ur: 1.6 mg/dL

## 2022-04-14 LAB — LIPID PANEL
Cholesterol: 277 mg/dL — ABNORMAL HIGH (ref <200)
HDL: 44 mg/dL — ABNORMAL LOW (ref >=50)
LDL Cholesterol (Calc): 201 mg/dL (calc) — ABNORMAL HIGH
Non-HDL Cholesterol (Calc): 233 mg/dL (calc) — ABNORMAL HIGH (ref <130)
Total CHOL/HDL Ratio: 6.3 (calc) — ABNORMAL HIGH (ref <5.0)
Triglycerides: 156 mg/dL — ABNORMAL HIGH (ref <150)

## 2022-04-14 LAB — HEMOGLOBIN A1C
Hgb A1c MFr Bld: 6.3 % of total Hgb — ABNORMAL HIGH (ref <5.7)
Mean Plasma Glucose: 134 mg/dL
eAG (mmol/L): 7.4 mmol/L

## 2022-04-15 ENCOUNTER — Other Ambulatory Visit: Payer: Self-pay | Admitting: Emergency Medicine

## 2022-04-15 DIAGNOSIS — E1165 Type 2 diabetes mellitus with hyperglycemia: Secondary | ICD-10-CM

## 2022-04-15 MED ORDER — SITAGLIPTIN PHOSPHATE 25 MG PO TABS: 25.0000 mg | ORAL_TABLET | Freq: Every day | ORAL | 1 refills | Status: DC

## 2022-04-21 ENCOUNTER — Encounter: Payer: Self-pay | Admitting: Family Medicine

## 2022-04-21 NOTE — Assessment & Plan Note (Addendum)
On stimulants and has been for decades, managed by her specialists HR and BP in normal limits today, she has no med SE or concerns BP Readings from Last 3 Encounters:  04/13/22 134/72  10/27/21 116/68  09/21/21 118/66   Pulse Readings from Last 3 Encounters:  04/13/22 92  10/27/21 96  09/21/21 94   PDMP reviewed and med list rechecked - cleaned up - she is on quite high daily dosing and dose was recently increased from 20 mg BID to 30 mg BID adderall IR

## 2022-04-21 NOTE — Assessment & Plan Note (Signed)
Chronic, likely genetic, pt managed with supplements, not on statin (refuses - I do not know if there has been a past attempt and failure to tolerate statins - but she has told me previously she is adamant about no statins) and she continues to work on healthy diet/lifestyle efforts and supplements like red yeast rice With DM, family hx, last lipids - her ASCVD risk is quite high - has been reviewed with her Thankfully no concerning sx of severe atherosclerotic disease/ACS and pt is very active and healthy

## 2022-04-21 NOTE — Assessment & Plan Note (Signed)
Managed by her specialist in [place], maintained on prozac Doing well, stable for decades as same meds, no SE or concerns    04/13/2022    9:18 AM 01/27/2022   11:21 AM 10/27/2021    9:05 AM  Depression screen PHQ 2/9  Decreased Interest 1 0 0  Down, Depressed, Hopeless 0 0 0  PHQ - 2 Score 1 0 0  Altered sleeping 0 0 0  Tired, decreased energy 0 0 0  Change in appetite 0 0 0  Feeling bad or failure about yourself  0 0 0  Trouble concentrating 0 0 0  Moving slowly or fidgety/restless 0 0 0  Suicidal thoughts 0 0 0  PHQ-9 Score 1 0 0  Difficult doing work/chores Not difficult at all Not difficult at all Not difficult at all

## 2022-04-21 NOTE — Assessment & Plan Note (Signed)
Has been well controlled with diet/lifestyle efforts and currently only taking januvia No ACEI/ARB or statin - prior discussion and pt is not interested She is UTD on her DM eye exam, we will do DM foot exam today and needed labs since she will be out of the country for a while and she would like to get her next 2 refills so she has meds while out of the country Last A1C 09/2021 6.1 - well controlled no med changes Good med compliance, no SE or concerns

## 2022-04-21 NOTE — Assessment & Plan Note (Signed)
Monitoring CBC every 6-12 months

## 2022-04-29 ENCOUNTER — Ambulatory Visit: Payer: Medicare PPO | Admitting: Internal Medicine

## 2022-04-29 ENCOUNTER — Encounter: Payer: Self-pay | Admitting: Internal Medicine

## 2022-04-29 VITALS — BP 116/64 | HR 93 | Temp 98.0°F | Resp 18 | Ht 64.0 in | Wt 168.5 lb

## 2022-04-29 DIAGNOSIS — S20212A Contusion of left front wall of thorax, initial encounter: Secondary | ICD-10-CM

## 2022-04-29 DIAGNOSIS — W19XXXA Unspecified fall, initial encounter: Secondary | ICD-10-CM | POA: Diagnosis not present

## 2022-04-29 MED ORDER — NAPROXEN 500 MG PO TABS: 500.0000 mg | ORAL_TABLET | Freq: Two times a day (BID) | ORAL | 0 refills | Status: AC

## 2022-04-29 NOTE — Progress Notes (Signed)
Acute Office Visit  Subjective:     Patient ID: Renee Young, female    DOB: 29-Apr-1945, 77 y.o.   MRN: [id number]  Chief Complaint  Patient presents with   Fall    Fall Pertinent negatives include no fever.   Patient is in today for bruised ribs after fall that occurred 5 days ago. She was in her garden and was pulling weeds when they came loose and she fell on her left side on the wet grass. At first she had no pain but now she has discomfort around her ribs on the left side and under her left breast. Pain is deep and a moderate deep ache. She denies skin changes other than a few bruises. She has some pain with deep inhalation but the pain is worse at night when she is trying to sleep. She is currently taking aspirin, ibuprofen and tylenol for pain and using salon pas pads.    Review of Systems  Constitutional:  Negative for chills and fever.  Respiratory:  Negative for shortness of breath.   Cardiovascular:  Negative for chest pain.  Musculoskeletal:  Positive for myalgias.        Objective:    BP 116/64   Pulse 93   Temp 98 F (36.7 C)   Resp 18   Ht '5\' 4"'$  (1.626 m)   Wt 168 lb 8 oz (76.4 kg)   SpO2 98%   BMI 28.92 kg/m  BP Readings from Last 3 Encounters:  04/29/22 116/64  04/13/22 134/72  10/27/21 116/68   Wt Readings from Last 3 Encounters:  04/29/22 168 lb 8 oz (76.4 kg)  04/13/22 164 lb 14.4 oz (74.8 kg)  10/27/21 163 lb 8 oz (74.2 kg)      Physical Exam Constitutional:      Appearance: Normal appearance.  HENT:     Head: Normocephalic and atraumatic.  Eyes:     Conjunctiva/sclera: Conjunctivae normal.  Cardiovascular:     Rate and Rhythm: Normal rate and regular rhythm.  Pulmonary:     Effort: Pulmonary effort is normal.     Breath sounds: Normal breath sounds.  Musculoskeletal:        General: Tenderness present. No swelling or deformity.  Skin:    General: Skin is warm and dry.     Findings: Bruising present.  Neurological:      General: No focal deficit present.     Mental Status: She is alert. Mental status is at baseline.  Psychiatric:        Mood and Affect: Mood normal.        Behavior: Behavior normal.     No results found for any visits on 04/29/22.      Assessment & Plan:   1. Contusion of rib on left side, initial encounter/Fall, initial encounter: Discussed obtaining an x-ray to assess for rib fracture but patient does not want to be x-rayed. Treat pain with scheduled anti-inflammatories, will prescribe Naproxen 500 mg BID x 10 days. Discussed all potential side effects, patient will take with foods and stop other OTC pain medications. Discussed using heat, rest and topicals as well for pain. Recommend Lidocaine patches at night to help her sleep. Follow up if symptoms worsen or fail to improve.   - naproxen (NAPROSYN) 500 MG tablet; Take 1 tablet (500 mg total) by mouth 2 (two) times daily with a meal for 10 days.  Dispense: 20 tablet; Refill: 0   Return if symptoms worsen or fail to  improve.  Teodora Medici, DO

## 2022-05-03 ENCOUNTER — Other Ambulatory Visit: Payer: Self-pay

## 2022-05-03 DIAGNOSIS — E1165 Type 2 diabetes mellitus with hyperglycemia: Secondary | ICD-10-CM

## 2022-05-04 ENCOUNTER — Other Ambulatory Visit: Payer: Self-pay | Admitting: Family Medicine

## 2022-05-04 ENCOUNTER — Other Ambulatory Visit: Payer: Self-pay

## 2022-05-04 DIAGNOSIS — E1165 Type 2 diabetes mellitus with hyperglycemia: Secondary | ICD-10-CM

## 2022-05-04 MED ORDER — SITAGLIPTIN PHOSPHATE 25 MG PO TABS: 25.0000 mg | ORAL_TABLET | Freq: Every day | ORAL | 1 refills | Status: DC

## 2022-05-04 NOTE — Telephone Encounter (Signed)
Per patient preference she would like to do [facility]. If PA needed will do once it comes through.

## 2022-05-04 NOTE — Telephone Encounter (Signed)
Per patient preference she prefers mail order now, she states previous visit with Kristeen Miss she told PCP she will eventually want to do mail order but that she was going to find out per insurance preference. [place] mail [facility] added to her pharmacy preferred list

## 2022-05-07 NOTE — Telephone Encounter (Signed)
Ok -in this scenario - I would hope that you update the pt chart preferred pharmacy and delete any previous ones so meds go to the preferred and correct place - thanks And if I previously sent in refills for 6 months and pt only needs it sent to a different pharmacy it would help me and help the pt if you would fix the rx and send to new pharmacy.  You don't have to send it back to me (unless you like to for an FYI situation) but I would appreciate you helping to fix it since I already made the medical decision and it just needs adjustment/help gettingn to the right place.  So you can send the same Rx, same meds, dosing, # or refills etc to the new pharmacy so pt gets what I originally already prescribed.  You don't even have to have me cosign since the med and decision and everything was already in the chart previously.    (I know this can't be done on all meds - but its helpful on majority of meds when pts want pharmacies changes)  Thanks so much!  Delsa Grana, PA-C

## 2022-05-18 DIAGNOSIS — F902 Attention-deficit hyperactivity disorder, combined type: Secondary | ICD-10-CM | POA: Diagnosis not present

## 2022-05-18 DIAGNOSIS — F411 Generalized anxiety disorder: Secondary | ICD-10-CM | POA: Diagnosis not present

## 2022-05-18 DIAGNOSIS — F332 Major depressive disorder, recurrent severe without psychotic features: Secondary | ICD-10-CM | POA: Diagnosis not present

## 2022-05-26 ENCOUNTER — Ambulatory Visit: Payer: Medicare PPO

## 2022-05-28 DIAGNOSIS — M25561 Pain in right knee: Secondary | ICD-10-CM | POA: Diagnosis not present

## 2022-05-31 ENCOUNTER — Encounter: Payer: Self-pay | Admitting: *Deleted

## 2022-09-15 ENCOUNTER — Encounter: Payer: Medicare PPO | Admitting: Family Medicine

## 2022-09-16 DIAGNOSIS — E119 Type 2 diabetes mellitus without complications: Secondary | ICD-10-CM | POA: Diagnosis not present

## 2022-09-17 NOTE — Patient Instructions (Incomplete)
Preventive Care 66 Years and Older, Female Preventive care refers to lifestyle choices and visits with your health care provider that can promote health and wellness. Preventive care visits are also called wellness exams. What can I expect for my preventive care visit? Counseling Your health care provider may ask you questions about your: Medical history, including: Past medical problems. Family medical history. Pregnancy and menstrual history. History of falls. Current health, including: Memory and ability to understand (cognition). Emotional well-being. Home life and relationship well-being. Sexual activity and sexual health. Lifestyle, including: Alcohol, nicotine or tobacco, and drug use. Access to firearms. Diet, exercise, and sleep habits. Work and work Astronomer. Sunscreen use. Safety issues such as seatbelt and bike helmet use. Physical exam Your health care provider will check your: Height and weight. These may be used to calculate your BMI (body mass index). BMI is a measurement that tells if you are at a healthy weight. Waist circumference. This measures the distance around your waistline. This measurement also tells if you are at a healthy weight and may help predict your risk of certain diseases, such as type 2 diabetes and high blood pressure. Heart rate and blood pressure. Body temperature. Skin for abnormal spots. What immunizations do I need?  Vaccines are usually given at various ages, according to a schedule. Your health care provider will recommend vaccines for you based on your age, medical history, and lifestyle or other factors, such as travel or where you work. What tests do I need? Screening Your health care provider may recommend screening tests for certain conditions. This may include: Lipid and cholesterol levels. Hepatitis C test. Hepatitis B test. HIV (human immunodeficiency virus) test. STI (sexually transmitted infection) testing, if you are at  risk. Lung cancer screening. Colorectal cancer screening. Diabetes screening. This is done by checking your blood sugar (glucose) after you have not eaten for a while (fasting). Mammogram. Talk with your health care provider about how often you should have regular mammograms. BRCA-related cancer screening. This may be done if you have a family history of breast, ovarian, tubal, or peritoneal cancers. Bone density scan. This is done to screen for osteoporosis. Talk with your health care provider about your test results, treatment options, and if necessary, the need for more tests. Follow these instructions at home: Eating and drinking  Eat a diet that includes fresh fruits and vegetables, whole grains, lean protein, and low-fat dairy products. Limit your intake of foods with high amounts of sugar, saturated fats, and salt. Take vitamin and mineral supplements as recommended by your health care provider. Do not drink alcohol if your health care provider tells you not to drink. If you drink alcohol: Limit how much you have to 0-1 drink a day. Know how much alcohol is in your drink. In the [place] one drink equals one 12 oz bottle of beer (355 mL), one 5 oz glass of wine (148 mL), or one 1 oz glass of hard liquor (44 mL). Lifestyle Brush your teeth every morning and night with fluoride toothpaste. Floss one time each day. Exercise for at least 30 minutes 5 or more days each week. Do not use any products that contain nicotine or tobacco. These products include cigarettes, chewing tobacco, and vaping devices, such as e-cigarettes. If you need help quitting, ask your health care provider. Do not use drugs. If you are sexually active, practice safe sex. Use a condom or other form of protection in order to prevent STIs. Take aspirin only as told by  your health care provider. Make sure that you understand how much to take and what form to take. Work with your health care provider to find out whether it  is safe and beneficial for you to take aspirin daily. Ask your health care provider if you need to take a cholesterol-lowering medicine (statin). Find healthy ways to manage stress, such as: Meditation, yoga, or listening to music. Journaling. Talking to a trusted person. Spending time with friends and family. Minimize exposure to UV radiation to reduce your risk of skin cancer. Safety Always wear your seat belt while driving or riding in a vehicle. Do not drive: If you have been drinking alcohol. Do not ride with someone who has been drinking. When you are tired or distracted. While texting. If you have been using any mind-altering substances or drugs. Wear a helmet and other protective equipment during sports activities. If you have firearms in your house, make sure you follow all gun safety procedures. What's next? Visit your health care provider once a year for an annual wellness visit. Ask your health care provider how often you should have your eyes and teeth checked. Stay up to date on all vaccines. This information is not intended to replace advice given to you by your health care provider. Make sure you discuss any questions you have with your health care provider. Document Revised: 07/30/2020 Document Reviewed: 07/30/2020 [employer] Patient Education  2024 [employer].

## 2022-09-20 ENCOUNTER — Ambulatory Visit
Admission: RE | Admit: 2022-09-20 | Discharge: 2022-09-20 | Disposition: A | Discharge status: Home / Self Care | Payer: Medicare PPO | Source: Ambulatory Visit | Attending: Family Medicine | Admitting: Family Medicine

## 2022-09-20 ENCOUNTER — Ambulatory Visit (INDEPENDENT_AMBULATORY_CARE_PROVIDER_SITE_OTHER): Payer: Medicare PPO | Admitting: Family Medicine

## 2022-09-20 ENCOUNTER — Encounter: Payer: Self-pay | Admitting: Family Medicine

## 2022-09-20 VITALS — BP 116/64 | HR 89 | Temp 97.8°F | Resp 16 | Ht 63.75 in | Wt 168.2 lb

## 2022-09-20 DIAGNOSIS — E782 Mixed hyperlipidemia: Secondary | ICD-10-CM

## 2022-09-20 DIAGNOSIS — E1165 Type 2 diabetes mellitus with hyperglycemia: Secondary | ICD-10-CM

## 2022-09-20 DIAGNOSIS — Z1231 Encounter for screening mammogram for malignant neoplasm of breast: Secondary | ICD-10-CM | POA: Diagnosis not present

## 2022-09-20 DIAGNOSIS — Z1211 Encounter for screening for malignant neoplasm of colon: Secondary | ICD-10-CM | POA: Diagnosis not present

## 2022-09-20 DIAGNOSIS — Z0000 Encounter for general adult medical examination without abnormal findings: Secondary | ICD-10-CM

## 2022-09-20 DIAGNOSIS — R1310 Dysphagia, unspecified: Secondary | ICD-10-CM

## 2022-09-20 MED ORDER — SITAGLIPTIN PHOSPHATE 25 MG PO TABS: 25.0000 mg | ORAL_TABLET | Freq: Every day | ORAL | 1 refills | Status: DC

## 2022-09-20 NOTE — Progress Notes (Unsigned)
Patient: Renee Young, Female    DOB: 04/14/1945, 77 y.o.   MRN: [id number] Danelle Berry, PA-C Visit Date: 09/20/2022  Today's Provider: Danelle Berry, PA-C   Chief Complaint  Patient presents with   Annual Exam   Subjective:   Annual physical exam:  Renee Young is a 77 y.o. female who presents today for complete physical exam:  Exercise/Activity:  4 d a wek 30 min each, but less recently with hip pain Diet/nutrition:  mostly cooks at home, variety of fruits and veggies  Sleep:  no concerns   SDOH Screenings   Food Insecurity: No Food Insecurity (09/20/2022)  Housing: Low Risk  (09/20/2022)  Transportation Needs: No Transportation Needs (09/20/2022)  Utilities: Not At Risk (09/20/2022)  Alcohol Screen: Low Risk  (09/20/2022)  Depression (PHQ2-9): Low Risk  (09/20/2022)  Financial Resource Strain: Low Risk  (09/20/2022)  Physical Activity: Insufficiently Active (09/20/2022)  Social Connections: Moderately Isolated (09/20/2022)  Stress: Stress Concern Present (09/20/2022)  Tobacco Use: Low Risk  (09/20/2022)  Health Literacy: Adequate Health Literacy (09/20/2022)    Pt wished to discuss right hand/finger lesion tender, trouble swallowing discuss acute complaints painfuil swallowing, feels like something is stuck - dyphagia with meat  Advised pt of separate visit billing/coding  USPSTF grade A and B recommendations - reviewed and addressed today  Depression:  Phq 9 completed today by patient, was reviewed by me with patient in the room PHQ score is neg, pt feels good    09/20/2022    2:45 PM 04/29/2022    8:12 AM 04/13/2022    9:18 AM 01/27/2022   11:21 AM  PHQ 2/9 Scores  PHQ - 2 Score 1 0 1 0  PHQ- 9 Score 1 0 1 0      09/20/2022    2:45 PM 04/29/2022    8:12 AM 04/13/2022    9:18 AM 01/27/2022   11:21 AM 10/27/2021    9:05 AM  Depression screen PHQ 2/9  Decreased Interest 1 0 1 0 0  Down, Depressed, Hopeless 0 0 0 0 0  PHQ - 2 Score 1 0 1 0 0  Altered sleeping 0 0 0 0 0  Tired,  decreased energy 0 0 0 0 0  Change in appetite 0 0 0 0 0  Feeling bad or failure about yourself  0 0 0 0 0  Trouble concentrating 0 0 0 0 0  Moving slowly or fidgety/restless 0 0 0 0 0  Suicidal thoughts 0 0 0 0 0  PHQ-9 Score 1 0 1 0 0  Difficult doing work/chores Not difficult at all Not difficult at all Not difficult at all Not difficult at all Not difficult at all    Alcohol screening: Flowsheet Row Office Visit from 09/20/2022 in [facility]  AUDIT-C Score 1       Immunizations and Health Maintenance: Health Maintenance  Topic Date Due   MAMMOGRAM  05/21/2022   INFLUENZA VACCINE  09/16/2022   COVID-19 Vaccine (6 - 2023-24 season) 10/06/2022 (Originally 11/26/2021)   Zoster Vaccines- Shingrix (2 of 2) 12/21/2022 (Originally 03/10/2021)   DEXA SCAN  01/28/2023 (Originally 03/22/2011)   Colonoscopy  01/28/2023 (Originally 12/28/2018)   HEMOGLOBIN A1C  10/12/2022   OPHTHALMOLOGY EXAM  10/14/2022   Medicare Annual Wellness (AWV)  01/28/2023   Diabetic kidney evaluation - eGFR measurement  04/14/2023   Diabetic kidney evaluation - Urine ACR  04/14/2023   FOOT EXAM  04/14/2023   Pneumonia Vaccine  16+ Years old  Completed   Hepatitis C Screening  Completed   HPV VACCINES  Aged Out   DTaP/Tdap/Td  Discontinued     Hep C Screening: done  STD testing and prevention (HIV/chl/gon/syphilis):  see above, no additional testing desired by pt today  Intimate partner violence: safe  Sexual History/Pain during Intercourse: Married  Menstrual History/LMP/Abnormal Bleeding: none,  No LMP recorded. Patient is postmenopausal.  Incontinence Symptoms: at night - urgency   Breast cancer: done today  Last Mammogram: *see HM list above BRCA gene screening:   Cervical cancer screening: aged out  Osteoporosis:   Discussion on osteoporosis per age, including high calcium and vitamin D supplementation, weight bearing exercises Pt is supplementing with daily Vit  D.  Skin cancer:  Hx of skin CA -  NO Discussed atypical lesions   Colorectal cancer:   Colonoscopy is due Discussed concerning signs and sx of CRC  Lung cancer:   Low Dose CT Chest recommended if Age 81-80 years, 20 pack-year currently smoking OR have quit w/in 15years. Patient does not qualify.    Social History   Tobacco Use   Smoking status: Never   Smokeless tobacco: Never  Vaping Use   Vaping status: Never Used  Substance Use Topics   Alcohol use: Yes    Alcohol/week: 1.0 standard drink of alcohol    Types: 1 Glasses of wine per week    Comment: occas.   Drug use: No     Flowsheet Row Office Visit from 09/20/20[facility] in [facility]  AUDIT-C Score 1       Family History  Problem Relation Age of Onset   Cancer Mother    Kidney disease Mother    Diabetes Father    Heart disease Father    Hyperlipidemia Father    Hypertension Father    Stroke Father    Arthritis Sister    Heart attack Sister    Obesity Brother    Arthritis Brother    Arthritis Daughter    Heart disease Maternal Grandmother    Stroke Maternal Grandmother    Hypertension Maternal Grandmother    Heart disease Maternal Grandfather    Hyperlipidemia Maternal Grandfather    Stroke Paternal Grandmother    Heart disease Paternal Grandmother    Heart disease Paternal Grandfather    Heart attack Paternal Grandfather      Blood pressure/Hypertension: BP Readings from Last 3 Encounters:  09/20/22 116/64  04/29/22 116/64  04/13/22 134/72    Weight/Obesity: Wt Readings from Last 3 Encounters:  09/20/22 168 lb 3.2 oz (76.3 kg)  04/29/22 168 lb 8 oz (76.4 kg)  04/13/22 164 lb 14.4 oz (74.8 kg)   BMI Readings from Last 3 Encounters:  09/20/22 29.10 kg/m  04/29/22 28.92 kg/m  04/13/22 28.31 kg/m     Lipids:  Lab Results  Component Value Date   CHOL 277 (H) 04/13/2022   CHOL 226 (H) 06/15/2021   CHOL 293 (H) 06/10/2020   Lab Results  Component Value Date    HDL 44 (L) 04/13/2022   HDL 48 (L) 06/15/2021   HDL 50 06/10/2020   Lab Results  Component Value Date   LDLCALC 201 (H) 04/13/2022   LDLCALC 148 (H) 06/15/2021   LDLCALC 218 (H) 06/10/2020   Lab Results  Component Value Date   TRIG 156 (H) 04/13/2022   TRIG 164 (H) 06/15/2021   TRIG 116 06/10/2020   Lab Results  Component Value Date   CHOLHDL  6.3 (H) 04/13/2022   CHOLHDL 4.7 06/15/2021   CHOLHDL 5.9 (H) 06/10/2020   Lab Results  Component Value Date   LDLDIRECT 222.0 03/07/2009   Based on the results of lipid panel his/her cardiovascular risk factor ( using [facility] )  in the next 10 years is: The 10-year ASCVD risk score (Arnett DK, et al., 2019) is: 27%   Values used to calculate the score:     Age: 53 years     Sex: Female     Is Non-Hispanic African American: No     Diabetic: Yes     Tobacco smoker: No     Systolic Blood Pressure: 116 mmHg     Is BP treated: No     HDL Cholesterol: 44 mg/dL     Total Cholesterol: 277 mg/dL  Glucose:  Glucose, Bld  Date Value Ref Range Status  04/13/2022 94 65 - 99 mg/dL Final    Comment:    .            Fasting reference interval .   06/15/2021 93 65 - 99 mg/dL Final    Comment:    .            Fasting reference interval .   12/15/2020 98 65 - 99 mg/dL Final    Comment:    .            Fasting reference interval .    Glucose-Capillary  Date Value Ref Range Status  10/09/2019 145 (H) 70 - 99 mg/dL Final    Comment:    Glucose reference range applies only to samples taken after fasting for at least 8 hours.  10/09/2019 148 (H) 70 - 99 mg/dL Final    Comment:    Glucose reference range applies only to samples taken after fasting for at least 8 hours.  10/08/2019 157 (H) 70 - 99 mg/dL Final    Comment:    Glucose reference range applies only to samples taken after fasting for at least 8 hours.    Advanced Care Planning:  A voluntary discussion about advance care planning including the explanation and  discussion of advance directives.   Discussed health care proxy and Living will, and the patient was able to identify a health care proxy as husband, then second [place]o [place].   Patient does have a living will at present time.   Social History       Social History   Socioeconomic History   Marital status: Married    Spouse name: Not on file   Number of children: Not on file   Years of education: Not on file   Highest education level: Not on file  Occupational History   Not on file  Tobacco Use   Smoking status: Never   Smokeless tobacco: Never  Vaping Use   Vaping status: Never Used  Substance and Sexual Activity   Alcohol use: Yes    Alcohol/week: 1.0 standard drink of alcohol    Types: 1 Glasses of wine per week    Comment: occas.   Drug use: No   Sexual activity: Yes  Other Topics Concern   Not on file  Social History Narrative   Married   Retired Financial trader   Social Determinants of Health   Financial Resource Strain: Low Risk  (09/20/2022)   Overall Financial Resource Strain (CARDIA)    Difficulty of Paying Living Expenses: Not hard at all  Food Insecurity: No Food Insecurity (09/20/2022)  Hunger Vital Sign    Worried About Running Out of Food in the Last Year: Never true    Ran Out of Food in the Last Year: Never true  Transportation Needs: No Transportation Needs (09/20/2022)   PRAPARE - Administrator, Civil Service (Medical): No    Lack of Transportation (Non-Medical): No  Physical Activity: Insufficiently Active (09/20/2022)   Exercise Vital Sign    Days of Exercise per Week: 4 days    Minutes of Exercise per Session: 30 min  Stress: Stress Concern Present (09/20/2022)   [employer] of Occupational Health - Occupational Stress Questionnaire    Feeling of Stress : Rather much  Social Connections: Moderately Isolated (09/20/2022)   Social Connection and Isolation Panel [NHANES]    Frequency of Communication with Friends and Family: More  than three times a week    Frequency of Social Gatherings with Friends and Family: More than three times a week    Attends Religious Services: Never    Database administrator or Organizations: No    Attends Engineer, structural: Never    Marital Status: Married    Family History        Family History  Problem Relation Age of Onset   Cancer Mother    Kidney disease Mother    Diabetes Father    Heart disease Father    Hyperlipidemia Father    Hypertension Father    Stroke Father    Arthritis Sister    Heart attack Sister    Obesity Brother    Arthritis Brother    Arthritis Daughter    Heart disease Maternal Grandmother    Stroke Maternal Grandmother    Hypertension Maternal Grandmother    Heart disease Maternal Grandfather    Hyperlipidemia Maternal Grandfather    Stroke Paternal Grandmother    Heart disease Paternal Grandmother    Heart disease Paternal Grandfather    Heart attack Paternal Grandfather     Patient Active Problem List   Diagnosis Date Noted   MDD (major depressive disorder), recurrent, in partial remission (HCC) 01/06/2021   S/P total knee arthroplasty 10/08/2019   Microcytic erythrocytes 09/24/2019   Family history of thyroid disease 05/30/2019   Primary osteoarthritis of both knees 05/30/2019   History of colonic polyps 12/17/2016   Type 2 diabetes mellitus with hyperglycemia, without long-term current use of insulin (HCC) 07/19/2007   Mixed hyperlipidemia 07/19/2007   Attention deficit disorder (ADD) 07/19/2007    Past Surgical History:  Procedure Laterality Date   CESAREAN SECTION     TOTAL KNEE ARTHROPLASTY Left 10/08/2019   Procedure: TOTAL KNEE ARTHROPLASTY;  Surgeon: Ollen Gross, MD;  Location: [facility];  Service: Orthopedics;  Laterality: Left;   TUBAL LIGATION     URETEROCELE INCISION     VASCULAR SURGERY     varicose vein laser surgery     Current Outpatient Medications:    ACCU-CHEK GUIDE test strip, , Disp: , Rfl:     Accu-Chek Softclix Lancets lancets, , Disp: , Rfl:    amphetamine-dextroamphetamine (ADDERALL) 30 MG tablet, Take 1 tablet by mouth 2 (two) times daily., Disp: , Rfl:    blood glucose meter kit and supplies KIT, Dispense based on patient and insurance preference. Use up to four times daily as directed. (FOR ICD-9 250.00, 250.01)., Disp: 1 each, Rfl: 0   cholecalciferol (VITAMIN D) 1000 units tablet, Take 1,000 Units by mouth daily., Disp: , Rfl:    FLUoxetine (PROZAC) 20  MG capsule, Take 40 mg by mouth at bedtime. , Disp: , Rfl:    Red Yeast Rice Extract (RED YEAST RICE PO), Take by mouth., Disp: , Rfl:    sitaGLIPtin (JANUVIA) 25 MG tablet, Take 1 tablet (25 mg total) by mouth daily., Disp: 90 tablet, Rfl: 1   triamcinolone (KENALOG) 0.025 % ointment, Apply 1 Application topically 2 (two) times daily as needed (raised itchy patches and rash). (Patient not taking: Reported on 09/20/2022), Disp: 15 g, Rfl: 1  Allergies  Allergen Reactions   Nitrofurantoin Hives and Swelling   Penicillins Swelling    Tolerated Cephalosporin Date: 10/09/19.      Patient Care Team: Danelle Berry, PA-C as PCP - General (Family Medicine) Ander Purpura, OD (Optometry) [facility], [place], [place], [place] as [employer] Care Management   Chart Review: I personally reviewed active problem list, medication list, allergies, family history, social history, health maintenance, notes from last encounter, lab results, imaging with the patient/caregiver today.   Review of Systems  Constitutional: Negative.   HENT: Negative.    Eyes: Negative.   Respiratory: Negative.    Cardiovascular: Negative.   Gastrointestinal: Negative.   Endocrine: Negative.   Genitourinary: Negative.   Musculoskeletal: Negative.   Skin: Negative.   Allergic/Immunologic: Negative.   Neurological: Negative.   Hematological: Negative.   Psychiatric/Behavioral: Negative.    All other systems  reviewed and are negative.         Objective:   Vitals:  Vitals:   09/20/22 1454  BP: 116/64  Pulse: 89  Resp: 16  Temp: 97.8 F (36.6 C)  TempSrc: Oral  SpO2: 99%  Weight: 168 lb 3.2 oz (76.3 kg)  Height: 5' 3.75" (1.619 m)    Body mass index is 29.1 kg/m.  Physical Exam Vitals and nursing note reviewed.  Constitutional:      General: She is not in acute distress.    Appearance: Normal appearance. She is well-developed. She is not ill-appearing, toxic-appearing or diaphoretic.  HENT:     Head: Normocephalic and atraumatic.     Right Ear: External ear normal.     Left Ear: External ear normal.  Eyes:     General: Lids are normal. No scleral icterus.       Right eye: No discharge.        Left eye: No discharge.     Conjunctiva/sclera: Conjunctivae normal.  Neck:     Trachea: Phonation normal. No tracheal deviation.  Cardiovascular:     Rate and Rhythm: Normal rate and regular rhythm.     Pulses: Normal pulses.          Radial pulses are 2+ on the right side and 2+ on the left side.       Posterior tibial pulses are 2+ on the right side and 2+ on the left side.     Heart sounds: Normal heart sounds. No murmur heard.    No friction rub. No gallop.  Pulmonary:     Effort: Pulmonary effort is normal. No respiratory distress.     Breath sounds: Normal breath sounds. No stridor. No wheezing, rhonchi or rales.  Chest:     Chest wall: No tenderness.  Abdominal:     General: Bowel sounds are normal. There is no distension.     Palpations: Abdomen is soft.  Musculoskeletal:     Right lower leg: No edema.     Left lower leg: No edema.  Skin:  General: Skin is warm and dry.     Coloration: Skin is not jaundiced or pale.     Findings: No rash.  Neurological:     Mental Status: She is alert.     Motor: No abnormal muscle tone.     Gait: Gait normal.  Psychiatric:        Mood and Affect: Mood normal.        Speech: Speech normal.        Behavior: Behavior normal.  Behavior is cooperative.       Fall Risk:    09/20/2022    2:45 PM 04/29/2022    8:10 AM 04/13/2022    9:17 AM 01/27/2022   11:21 AM 10/27/2021    9:05 AM  Fall Risk   Falls in the past year? 1 1 1  0 1  Number falls in past yr: 1 1 1  0 0  Injury with Fall? 0 1 0 0 0  Risk for fall due to : Impaired balance/gait  Impaired balance/gait No Fall Risks Impaired balance/gait;Impaired mobility  Follow up Falls prevention discussed;Education provided;Falls evaluation completed  Falls prevention discussed;Education provided;Falls evaluation completed Falls prevention discussed;Education provided;Falls evaluation completed Falls prevention discussed;Education provided    Functional Status Survey: Is the patient deaf or have difficulty hearing?: Yes Does the patient have difficulty seeing, even when wearing glasses/contacts?: No Does the patient have difficulty concentrating, remembering, or making decisions?: No Does the patient have difficulty walking or climbing stairs?: Yes Does the patient have difficulty dressing or bathing?: No Does the patient have difficulty doing errands alone such as visiting a doctor's office or shopping?: No   Assessment & Plan:    CPE completed today  USPSTF grade A and B recommendations reviewed with patient; age-appropriate recommendations, preventive care, screening tests, etc discussed and encouraged; healthy living encouraged; see AVS for patient education given to patient  Discussed importance of 150 minutes of physical activity weekly, AHA exercise recommendations given to pt in AVS/handout  Discussed importance of healthy diet:  eating lean meats and proteins, avoiding trans fats and saturated fats, avoid simple sugars and excessive carbs in diet, eat 6 servings of fruit/vegetables daily and drink plenty of water and avoid sweet beverages.    Recommended pt to do annual eye exam and routine dental exams/cleanings  Depression, alcohol, fall screening  completed as documented above and per flowsheets  Advance Care planning information and packet discussed and offered today, encouraged pt to discuss with family members/spouse/partner/friends and complete Advanced directive packet and bring copy to office   Reviewed Health Maintenance: Health Maintenance  Topic Date Due   MAMMOGRAM  05/21/2022   INFLUENZA VACCINE  09/16/2022   COVID-19 Vaccine (6 - 2023-24 season) 10/06/2022 (Originally 11/26/2021)   Zoster Vaccines- Shingrix (2 of 2) 12/21/2022 (Originally 03/10/2021)   DEXA SCAN  01/28/2023 (Originally 03/22/2011)   Colonoscopy  01/28/2023 (Originally 12/28/2018)   HEMOGLOBIN A1C  10/12/2022   OPHTHALMOLOGY EXAM  10/14/2022   Medicare Annual Wellness (AWV)  01/28/2023   Diabetic kidney evaluation - eGFR measurement  04/14/2023   Diabetic kidney evaluation - Urine ACR  04/14/2023   FOOT EXAM  04/14/2023   Pneumonia Vaccine 39+ Years old  Completed   Hepatitis C Screening  Completed   HPV VACCINES  Aged Out   DTaP/Tdap/Td  Discontinued    Immunizations: Immunization History  Administered Date(s) Administered   Influenza, High Dose Seasonal PF 12/06/2017   Influenza, Seasonal, Injecte, Preservative Fre 04/05/2012   Influenza,inj,Quad PF,6+  Mos 01/07/2014, 01/06/2015, 12/24/2016   Moderna Covid-19 Vaccine Bivalent Booster 50yrs & up 10/01/2021   Moderna Sars-Covid-2 Vaccination 04/11/2019, 05/09/2019, 12/31/2019, 09/03/2020   Pneumococcal Conjugate-13 01/07/2014   Pneumococcal Polysaccharide-23 04/27/2012   Tdap 04/27/2012   Zoster Recombinant(Shingrix) 01/13/2021   Zoster, Live 10/03/2007   Vaccines:  Shingrix: due for final shingrix shot Pneumonia: eligible for PCV20 educated and discussed with patient. Flu: out of season -  educated and discussed with patient. COVID:      ICD-10-CM   1. Annual physical exam  Z00.00 COMPLETE METABOLIC PANEL WITH GFR    2. Mixed hyperlipidemia  E78.2     3. Type 2 diabetes mellitus with  hyperglycemia, without long-term current use of insulin (HCC)  E11.65 Hemoglobin A1c    sitaGLIPtin (JANUVIA) 25 MG tablet   has been well controlled on jardiance only    4. Dysphagia, unspecified type  R13.10    occurs occasionally- once a month or so    5. Screening for malignant neoplasm of colon  Z12.11 Ambulatory referral to Gastroenterology          Danelle Berry, PA-C 09/20/22 2:58 PM  [facility]

## 2022-09-22 ENCOUNTER — Encounter: Payer: Self-pay | Admitting: Family Medicine

## 2022-09-22 ENCOUNTER — Other Ambulatory Visit: Payer: Self-pay

## 2022-09-22 ENCOUNTER — Telehealth: Payer: Self-pay

## 2022-09-22 DIAGNOSIS — Z86010 Personal history of colonic polyps: Secondary | ICD-10-CM

## 2022-09-22 MED ORDER — PEG 3350-KCL-NA BICARB-NACL 420 G PO SOLR: 4000.0000 mL | Freq: Once | ORAL | 0 refills | Status: AC

## 2022-09-22 NOTE — Telephone Encounter (Signed)
Gastroenterology Pre-Procedure Review  Request Date: 10/27/22 Requesting Physician: Dr. Tobi Bastos  PATIENT REVIEW QUESTIONS: The patient responded to the following health history questions as indicated:    1. Are you having any GI issues? no 2. Do you have a personal history of Polyps? yes (last colonoscopy 12/27/2013) 3. Do you have a family history of Colon Cancer or Polyps? no 4. Diabetes Mellitus? yes (has been advised to stop [place] 2 days prior to colonoscopy) 5. Joint replacements in the past 12 months?no 6. Major health problems in the past 3 months?no 7. Any artificial heart valves, MVP, or defibrillator?no    MEDICATIONS & ALLERGIES:    Patient reports the following regarding taking any anticoagulation/antiplatelet therapy:   Plavix, Coumadin, Eliquis, Xarelto, Lovenox, Pradaxa, Brilinta, or Effient? no Aspirin? no  Patient confirms/reports the following medications:  Current Outpatient Medications  Medication Sig Dispense Refill   ACCU-CHEK GUIDE test strip      Accu-Chek Softclix Lancets lancets      amphetamine-dextroamphetamine (ADDERALL) 30 MG tablet Take 1 tablet by mouth 2 (two) times daily.     blood glucose meter kit and supplies KIT Dispense based on patient and insurance preference. Use up to four times daily as directed. (FOR ICD-9 250.00, 250.01). 1 each 0   cholecalciferol (VITAMIN D) 1000 units tablet Take 1,000 Units by mouth daily.     FLUoxetine (PROZAC) 20 MG capsule Take 40 mg by mouth at bedtime.      Red Yeast Rice Extract (RED YEAST RICE PO) Take by mouth.     sitaGLIPtin (JANUVIA) 25 MG tablet Take 1 tablet (25 mg total) by mouth daily. 90 tablet 1   triamcinolone (KENALOG) 0.025 % ointment Apply 1 Application topically 2 (two) times daily as needed (raised itchy patches and rash). (Patient not taking: Reported on 09/20/2022) 15 g 1   No current facility-administered medications for this visit.    Patient confirms/reports the following allergies:   Allergies  Allergen Reactions   Nitrofurantoin Hives and Swelling   Penicillins Swelling    Tolerated Cephalosporin Date: 10/09/19.      No orders of the defined types were placed in this encounter.   AUTHORIZATION INFORMATION Primary Insurance: 1D#: Group #:  Secondary Insurance: 1D#: Group #:  SCHEDULE INFORMATION: Date: 10/27/22 Time: Location: [facility]

## 2022-10-04 ENCOUNTER — Ambulatory Visit: Payer: Medicare PPO | Admitting: Sports Medicine

## 2022-10-05 ENCOUNTER — Ambulatory Visit
Admission: RE | Admit: 2022-10-05 | Discharge: 2022-10-05 | Disposition: A | Discharge status: Home / Self Care | Payer: Medicare PPO | Source: Ambulatory Visit | Attending: Sports Medicine | Admitting: Sports Medicine

## 2022-10-05 ENCOUNTER — Ambulatory Visit: Payer: Medicare PPO | Admitting: Sports Medicine

## 2022-10-05 ENCOUNTER — Encounter: Payer: Self-pay | Admitting: Sports Medicine

## 2022-10-05 VITALS — BP 107/86 | Ht 63.75 in | Wt 168.0 lb

## 2022-10-05 DIAGNOSIS — M25551 Pain in right hip: Secondary | ICD-10-CM

## 2022-10-05 DIAGNOSIS — M1611 Unilateral primary osteoarthritis, right hip: Secondary | ICD-10-CM

## 2022-10-05 DIAGNOSIS — M5416 Radiculopathy, lumbar region: Secondary | ICD-10-CM | POA: Diagnosis not present

## 2022-10-05 NOTE — Progress Notes (Signed)
   Subjective:    Patient ID: Renee Young, female    DOB: [birth year], 77 y.o.   MRN: [id number]  HPI chief complaint: Right hip and low back pain  Patient presents today complaining of right hip and low back pain that has been present since early June.  She was visiting her daughter and [place] in [place] and doing quite a bit of walking.  She has recently returned to the [place] for a few weeks.  She is planning on returning again in October.  Majority of her hip pain is in the groin.  She does get some radiating pain into the low back as well as some tenderness along the lateral hip.  She has several over-the-counter medications including aspirin, Tylenol, ibuprofen, and naproxen sodium that she will take as needed.  These are beneficial.  Pain will occasionally radiate down the thigh but not past the knee.    Review of Systems As above    Objective:   Physical Exam  Well-developed, well-nourished.  No acute distress  Right hip: Limited internal rotation with reproducible groin pain.  Good external rotation.  She is slightly tender to palpation at the posterior greater trochanter at the insertion of the gluteus medius/gluteus minimus.  Neurovascularly intact distally.  X-rays of her lumbar spine including AP and lateral views as well as x-rays of the right hip are reviewed.  Right hip x-ray shows moderately advanced DJD.  Nothing acute.  Lumbar spine x-rays show multilevel degenerative disc disease and facet arthropathy with underlying scoliosis.  There is also a grade 1 spondylolisthesis of L5 on S1.    Assessment & Plan:   Right hip pain likely secondary to DJD  I recommend referral to Dr. Pearletha Forge for ultrasound guided intra-articular right hip injection.  Although she does have some advanced degenerative changes in her lumbar spine, her x-rays and clinical exam suggest right hip OA to be her pain generator.  We will schedule this to be done at her convenience.  She will follow-up  with me as needed.  This note was dictated using Dragon naturally speaking software and may contain errors in syntax, spelling, or content which have not been identified prior to signing this note.

## 2022-10-07 DIAGNOSIS — F902 Attention-deficit hyperactivity disorder, combined type: Secondary | ICD-10-CM | POA: Diagnosis not present

## 2022-10-07 DIAGNOSIS — F332 Major depressive disorder, recurrent severe without psychotic features: Secondary | ICD-10-CM | POA: Diagnosis not present

## 2022-10-07 DIAGNOSIS — F411 Generalized anxiety disorder: Secondary | ICD-10-CM | POA: Diagnosis not present

## 2022-10-12 ENCOUNTER — Ambulatory Visit: Payer: Medicare PPO | Admitting: Family Medicine

## 2022-10-20 ENCOUNTER — Encounter: Payer: Self-pay | Admitting: Family Medicine

## 2022-10-20 ENCOUNTER — Ambulatory Visit: Payer: Medicare PPO | Admitting: Family Medicine

## 2022-10-20 ENCOUNTER — Other Ambulatory Visit: Payer: Self-pay

## 2022-10-20 VITALS — BP 134/78 | Ht 63.75 in | Wt 160.0 lb

## 2022-10-20 DIAGNOSIS — M25551 Pain in right hip: Secondary | ICD-10-CM

## 2022-10-20 MED ORDER — METHYLPREDNISOLONE ACETATE 40 MG/ML IJ SUSP
40.0000 mg | Freq: Once | INTRAMUSCULAR | Status: AC
Start: 2022-10-20 — End: 2022-10-20
  Administered 2022-10-20: 40 mg via INTRA_ARTICULAR

## 2022-10-20 NOTE — Progress Notes (Signed)
After informed written consent timeout was performed, patient was lying supine on exam table.  Area overlying right hip joint prepped with alcohol swab then utilizing ultrasound guidance, patient's right hip joint was injected with 4:1 lidocaine: depomedrol 80mg .  Patient tolerated procedure well without immediate complications.

## 2022-10-26 ENCOUNTER — Encounter: Payer: Self-pay | Admitting: Gastroenterology

## 2022-10-26 ENCOUNTER — Ambulatory Visit: Payer: Medicare PPO | Admitting: Sports Medicine

## 2022-10-26 VITALS — BP 126/82 | Ht 63.75 in | Wt 160.0 lb

## 2022-10-26 DIAGNOSIS — M1611 Unilateral primary osteoarthritis, right hip: Secondary | ICD-10-CM | POA: Diagnosis not present

## 2022-10-26 NOTE — Progress Notes (Addendum)
Patient ID: Renee Young, female   DOB: 09-08-1945, 77 y.o.   MRN: [id number]  Renee Young presents today for follow-up on right hip pain.  She did notice some immediate improvement in her right hip pain after a recent ultrasound-guided intra-articular hip injection.  Unfortunately the injection has started to wear off.  X-rays of her right hip showed moderately advanced DJD in her hip.  X-rays of her lumbar spine also showed multilevel degenerative changes.  Physical exam was not repeated today.  We simply talked about next steps going forward.  She understands that definitive treatment for her right hip is a total hip arthroplasty but she has an overseas trip to [place] planned at the end of October and will be there for the better part of 3 months visiting family.  She also has chronic right knee pain secondary to end-stage DJD.  Status post left knee total knee arthroplasty by Dr. Antony Odea and has done very well with that.  She is planning on having her right knee replaced once she returns from overseas.  I think for both her hip and her knee she would benefit from physical therapy.  I have also recommended that she try compression sleeve on the right knee when active.  She does find some relief and topical Voltaren and Tylenol arthritis as well as ibuprofen.  She may continue with this combination as needed.  We also discussed a possible right knee cortisone injection about a week prior to her trip to [place].  Follow-up as needed.  This note was dictated using Dragon naturally speaking software and may contain errors in syntax, spelling, or content which have not been identified prior to signing this note.

## 2022-10-27 ENCOUNTER — Ambulatory Visit: Payer: Medicare PPO

## 2022-10-27 ENCOUNTER — Ambulatory Visit
Admission: RE | Admit: 2022-10-27 | Discharge: 2022-10-27 | Disposition: A | Discharge status: Home / Self Care | Payer: Medicare PPO | Attending: Gastroenterology | Admitting: Gastroenterology

## 2022-10-27 ENCOUNTER — Encounter
Admission: RE | Disposition: A | Discharge status: Home / Self Care | Payer: Self-pay | Source: Home / Self Care | Attending: Gastroenterology

## 2022-10-27 DIAGNOSIS — D125 Benign neoplasm of sigmoid colon: Secondary | ICD-10-CM | POA: Diagnosis not present

## 2022-10-27 DIAGNOSIS — K219 Gastro-esophageal reflux disease without esophagitis: Secondary | ICD-10-CM | POA: Insufficient documentation

## 2022-10-27 DIAGNOSIS — E119 Type 2 diabetes mellitus without complications: Secondary | ICD-10-CM | POA: Insufficient documentation

## 2022-10-27 DIAGNOSIS — Z7984 Long term (current) use of oral hypoglycemic drugs: Secondary | ICD-10-CM | POA: Diagnosis not present

## 2022-10-27 DIAGNOSIS — Z86010 Personal history of colonic polyps: Secondary | ICD-10-CM | POA: Insufficient documentation

## 2022-10-27 DIAGNOSIS — K635 Polyp of colon: Secondary | ICD-10-CM | POA: Diagnosis not present

## 2022-10-27 DIAGNOSIS — Z1211 Encounter for screening for malignant neoplasm of colon: Secondary | ICD-10-CM | POA: Insufficient documentation

## 2022-10-27 DIAGNOSIS — D126 Benign neoplasm of colon, unspecified: Secondary | ICD-10-CM

## 2022-10-27 HISTORY — PX: COLONOSCOPY WITH PROPOFOL: SHX5780

## 2022-10-27 HISTORY — PX: POLYPECTOMY: SHX5525

## 2022-10-27 LAB — GLUCOSE, CAPILLARY: Glucose-Capillary: 77 mg/dL (ref 70–99)

## 2022-10-27 SURGERY — COLONOSCOPY WITH PROPOFOL
Anesthesia: General

## 2022-10-27 MED ORDER — PROPOFOL 500 MG/50ML IV EMUL
INTRAVENOUS | Status: DC | PRN
  Administered 2022-10-27: 100 ug/kg/min via INTRAVENOUS

## 2022-10-27 MED ORDER — LIDOCAINE HCL (PF) 2 % IJ SOLN
INTRAMUSCULAR | Status: AC
  Filled 2022-10-27: qty 5

## 2022-10-27 MED ORDER — LIDOCAINE HCL (CARDIAC) PF 100 MG/5ML IV SOSY
PREFILLED_SYRINGE | INTRAVENOUS | Status: DC | PRN
  Administered 2022-10-27: 100 mg via INTRAVENOUS

## 2022-10-27 MED ORDER — SODIUM CHLORIDE 0.9 % IV SOLN
INTRAVENOUS | Status: DC
  Administered 2022-10-27: 20 mL/h via INTRAVENOUS

## 2022-10-27 MED ORDER — PROPOFOL 10 MG/ML IV BOLUS
INTRAVENOUS | Status: DC | PRN
  Administered 2022-10-27: 40 mg via INTRAVENOUS
  Administered 2022-10-27: 50 mg via INTRAVENOUS

## 2022-10-27 NOTE — Transfer of Care (Signed)
Immediate Anesthesia Transfer of Care Note  Patient: Renee Young  Procedure(s) Performed: COLONOSCOPY WITH PROPOFOL POLYPECTOMY  Patient Location: Endoscopy Unit  Anesthesia Type:General  Level of Consciousness: drowsy and patient cooperative  Airway & Oxygen Therapy: Patient Spontanous Breathing and Patient connected to face mask oxygen  Post-op Assessment: Report given to RN and Patient moving all extremities X 4  Post vital signs: Reviewed and stable  Last Vitals:  Vitals Value Taken Time  BP    Temp    Pulse    Resp    SpO2      Last Pain:  Vitals:   10/27/22 1223  TempSrc: Temporal  PainSc: 0-No pain         Complications: No notable events documented.

## 2022-10-27 NOTE — Anesthesia Procedure Notes (Signed)
Procedure Name: General with mask airway Date/Time: 10/27/2022 12:54 PM  Performed by: Lily Lovings, CRNAPre-anesthesia Checklist: Patient identified, Emergency Drugs available, Suction available and Patient being monitored Patient Re-evaluated:Patient Re-evaluated prior to induction Oxygen Delivery Method: Simple face mask Preoxygenation: Pre-oxygenation with 100% oxygen Induction Type: IV induction Comments: POM

## 2022-10-27 NOTE — Op Note (Signed)
[facility] Gastroenterology Patient Name: Renee Young Procedure Date: 10/27/2022 12:47 PM MRN: [id number] Account #: [id number] Date of Birth: 1945/12/19 Admit Type: Outpatient Age: 77 Room: [facility] ENDO ROOM 3 Gender: Female Note Status: Finalized Instrument Name: Prentice Docker [id number] Procedure:             Colonoscopy Indications:           Surveillance: Personal history of adenomatous polyps                         on last colonoscopy 5 years ago Providers:             Wyline Mood MD, MD Referring MD:          Danelle Berry (Referring MD) Medicines:             Monitored Anesthesia Care Complications:         No immediate complications. Procedure:             Pre-Anesthesia Assessment:                        - Prior to the procedure, a History and Physical was                         performed, and patient medications, allergies and                         sensitivities were reviewed. The patient's tolerance                         of previous anesthesia was reviewed.                        - The risks and benefits of the procedure and the                         sedation options and risks were discussed with the                         patient. All questions were answered and informed                         consent was obtained.                        - ASA Grade Assessment: II - A patient with mild                         systemic disease.                        After obtaining informed consent, the colonoscope was                         passed under direct vision. Throughout the procedure,                         the patient's blood pressure, pulse, and oxygen                         saturations were  monitored continuously. The                         Colonoscope was introduced through the anus and                         advanced to the the cecum, identified by the                         appendiceal orifice. The colonoscopy was performed                          with ease. The patient tolerated the procedure well.                         The quality of the bowel preparation was good. The                         ileocecal valve, appendiceal orifice, and rectum were                         photographed. Findings:      The perianal and digital rectal examinations were normal.      A 5 mm polyp was found in the sigmoid colon. The polyp was sessile. The       polyp was removed with a cold snare. Resection and retrieval were       complete.      The exam was otherwise without abnormality on direct and retroflexion       views. Impression:            - One 5 mm polyp in the sigmoid colon, removed with a                         cold snare. Resected and retrieved.                        - The examination was otherwise normal on direct and                         retroflexion views. Recommendation:        - Discharge patient to home (with escort).                        - Resume previous diet.                        - Continue present medications.                        - Await pathology results.                        - Repeat colonoscopy is not recommended due to current                         age (31 years or older) for surveillance. Procedure Code(s):     --- Professional ---  24401, Colonoscopy, flexible; with removal of                         tumor(s), polyp(s), or other lesion(s) by snare                         technique Diagnosis Code(s):     --- Professional ---                        Z86.010, Personal history of colonic polyps                        D12.5, Benign neoplasm of sigmoid colon CPT copyright 2022 American Medical Association. All rights reserved. The codes documented in this report are preliminary and upon coder review may  be revised to meet current compliance requirements. Wyline Mood, MD Wyline Mood MD, MD 10/27/2022 1:09:50 PM This report has been signed electronically. Number of Addenda: 0 Note  Initiated On: 10/27/2022 12:47 PM Scope Withdrawal Time: 0 hours 9 minutes 9 seconds  Total Procedure Duration: 0 hours 11 minutes 47 seconds  Estimated Blood Loss:  Estimated blood loss: none.      [facility]

## 2022-10-27 NOTE — Anesthesia Preprocedure Evaluation (Signed)
Anesthesia Evaluation  Patient identified by MRN, date of birth, ID band Patient awake    Reviewed: Allergy & Precautions, NPO status , Patient's Chart, lab work & pertinent test results  History of Anesthesia Complications Negative for: history of anesthetic complications  Airway Mallampati: III  TM Distance: >3 FB Neck ROM: full    Dental no notable dental hx.    Pulmonary neg pulmonary ROS   Pulmonary exam normal        Cardiovascular negative cardio ROS Normal cardiovascular exam     Neuro/Psych  PSYCHIATRIC DISORDERS  Depression    negative neurological ROS     GI/Hepatic Neg liver ROS,GERD  Medicated,,  Endo/Other  negative endocrine ROSdiabetes    Renal/GU Renal disease  negative genitourinary   Musculoskeletal  (+) Arthritis ,    Abdominal   Peds  Hematology  (+) Blood dyscrasia, anemia   Anesthesia Other Findings Past Medical History: No date: ADD (attention deficit disorder) No date: Allergy No date: Anemia No date: Arthritis 05/30/2019: Bilateral temporomandibular joint pain No date: Chicken pox No date: Colon polyps No date: Depression No date: Diabetes mellitus without complication (HCC) No date: GERD (gastroesophageal reflux disease) No date: Kidney stones No date: Phlebitis No date: UTI (urinary tract infection) No date: UTI (urinary tract infection) 07/03/2012: Varicose veins of bilateral lower extremities with other  complications  Past Surgical History: No date: CESAREAN SECTION 10/08/2019: TOTAL KNEE ARTHROPLASTY; Left     Comment:  Procedure: TOTAL KNEE ARTHROPLASTY;  Surgeon: Ollen Gross, MD;  Location: [facility] ORS;  Service: Orthopedics;                Laterality: Left; No date: TUBAL LIGATION No date: URETEROCELE INCISION No date: VASCULAR SURGERY     Comment:  varicose vein laser surgery     Reproductive/Obstetrics negative OB ROS                              Anesthesia Physical Anesthesia Plan  ASA: 2  Anesthesia Plan: General   Post-op Pain Management: Minimal or no pain anticipated   Induction: Intravenous  PONV Risk Score and Plan: 2 and Propofol infusion and TIVA  Airway Management Planned: Natural Airway and Nasal Cannula  Additional Equipment:   Intra-op Plan:   Post-operative Plan:   Informed Consent: I have reviewed the patients History and Physical, chart, labs and discussed the procedure including the risks, benefits and alternatives for the proposed anesthesia with the patient or authorized representative who has indicated his/her understanding and acceptance.     Dental Advisory Given  Plan Discussed with: Anesthesiologist, CRNA and Surgeon  Anesthesia Plan Comments: (Patient consented for risks of anesthesia including but not limited to:  - adverse reactions to medications - risk of airway placement if required - damage to eyes, teeth, lips or other oral mucosa - nerve damage due to positioning  - sore throat or hoarseness - Damage to heart, brain, nerves, lungs, other parts of body or loss of life  Patient voiced understanding.)       Anesthesia Quick Evaluation

## 2022-10-27 NOTE — H&P (Signed)
Wyline Mood, MD [address], [address], [place], [place], [address] [address], [address], [place], [place], [address] Phone: [phone]  Fax: [phone]  Primary Care Physician:  Danelle Berry, PA-C   Pre-Procedure History & Physical: HPI:  Renee Young is a 77 y.o. female is here for an colonoscopy.   Past Medical History:  Diagnosis Date   ADD (attention deficit disorder)    Allergy    Anemia    Arthritis    Bilateral temporomandibular joint pain 05/30/2019   Chicken pox    Colon polyps    Depression    Diabetes mellitus without complication (HCC)    GERD (gastroesophageal reflux disease)    Kidney stones    Phlebitis    UTI (urinary tract infection)    UTI (urinary tract infection)    Varicose veins of bilateral lower extremities with other complications 07/03/2012    Past Surgical History:  Procedure Laterality Date   CESAREAN SECTION     TOTAL KNEE ARTHROPLASTY Left 10/08/2019   Procedure: TOTAL KNEE ARTHROPLASTY;  Surgeon: Ollen Gross, MD;  Location: [facility];  Service: Orthopedics;  Laterality: Left;   TUBAL LIGATION     URETEROCELE INCISION     VASCULAR SURGERY     varicose vein laser surgery    Prior to Admission medications   Medication Sig Start Date End Date Taking? Authorizing Provider  ACCU-CHEK GUIDE test strip  11/28/19  Yes [provider]  Accu-Chek Softclix Lancets lancets  11/28/19  Yes [provider]  cholecalciferol (VITAMIN D) 1000 units tablet Take 1,000 Units by mouth daily.   Yes [provider]  FLUoxetine (PROZAC) 20 MG capsule Take 40 mg by mouth at bedtime.    Yes [provider]  Red Yeast Rice Extract (RED YEAST RICE PO) Take by mouth.   Yes [provider]  sitaGLIPtin (JANUVIA) 25 MG tablet Take 1 tablet (25 mg total) by mouth daily. 09/20/22  Yes Danelle Berry, PA-C  amphetamine-dextroamphetamine (ADDERALL) 30 MG tablet Take 1 tablet by mouth 2 (two) times daily. Patient not  taking: Reported on 10/27/2022 09/26/21   [provider]  blood glucose meter kit and supplies KIT Dispense based on patient and insurance preference. Use up to four times daily as directed. (FOR ICD-9 250.00, 250.01). 10/26/19   Jamelle Haring, MD  triamcinolone (KENALOG) 0.025 % ointment Apply 1 Application topically 2 (two) times daily as needed (raised itchy patches and rash). Patient not taking: Reported on 09/20/2022 10/27/21   Danelle Berry, PA-C    Allergies as of 09/22/2022 - Review Complete 09/22/2022  Allergen Reaction Noted   Nitrofurantoin Hives and Swelling 07/19/2007   Penicillins Swelling 07/19/2007    Family History  Problem Relation Age of Onset   Cancer Mother    Kidney disease Mother    Diabetes Father    Heart disease Father    Hyperlipidemia Father    Hypertension Father    Stroke Father    Arthritis Sister    Heart attack Sister    Obesity Brother    Arthritis Brother    Arthritis Daughter    Heart disease Maternal Grandmother    Stroke Maternal Grandmother    Hypertension Maternal Grandmother    Heart disease Maternal Grandfather    Hyperlipidemia Maternal Grandfather    Stroke Paternal Grandmother    Heart disease Paternal Grandmother    Heart disease Paternal Grandfather    Heart attack Paternal Grandfather     Social History  Socioeconomic History   Marital status: Married    Spouse name: Not on file   Number of children: Not on file   Years of education: Not on file   Highest education level: Not on file  Occupational History   Not on file  Tobacco Use   Smoking status: Never   Smokeless tobacco: Never  Vaping Use   Vaping status: Never Used  Substance and Sexual Activity   Alcohol use: Yes    Alcohol/week: 1.0 standard drink of alcohol    Types: 1 Glasses of wine per week    Comment: occas.   Drug use: No   Sexual activity: Yes  Other Topics Concern   Not on file  Social History Narrative   Married   Retired  Financial trader   Social Determinants of Health   Financial Resource Strain: Low Risk  (09/20/2022)   Overall Financial Resource Strain (CARDIA)    Difficulty of Paying Living Expenses: Not hard at all  Food Insecurity: No Food Insecurity (09/20/2022)   Hunger Vital Sign    Worried About Running Out of Food in the Last Year: Never true    Ran Out of Food in the Last Year: Never true  Transportation Needs: No Transportation Needs (09/20/2022)   PRAPARE - Administrator, Civil Service (Medical): No    Lack of Transportation (Non-Medical): No  Physical Activity: Insufficiently Active (09/20/2022)   Exercise Vital Sign    Days of Exercise per Week: 4 days    Minutes of Exercise per Session: 30 min  Stress: Stress Concern Present (09/20/2022)   [employer] of Occupational Health - Occupational Stress Questionnaire    Feeling of Stress : Rather much  Social Connections: Moderately Isolated (09/20/2022)   Social Connection and Isolation Panel [NHANES]    Frequency of Communication with Friends and Family: More than three times a week    Frequency of Social Gatherings with Friends and Family: More than three times a week    Attends Religious Services: Never    Database administrator or Organizations: No    Attends Banker Meetings: Never    Marital Status: Married  Catering manager Violence: Not At Risk (09/20/2022)   Humiliation, Afraid, Rape, and Kick questionnaire    Fear of Current or Ex-Partner: No    Emotionally Abused: No    Physically Abused: No    Sexually Abused: No    Review of Systems: See HPI, otherwise negative ROS  Physical Exam: BP (!) 161/67   Pulse 83   Temp (!) 96.9 F (36.1 C) (Temporal)   Resp 20   Ht 5' 3.5" (1.613 m)   Wt 73.7 kg   SpO2 99%   BMI 28.32 kg/m  General:   Alert,  pleasant and cooperative in NAD Head:  Normocephalic and atraumatic. Neck:  Supple; no masses or thyromegaly. Lungs:  Clear throughout to auscultation,  normal respiratory effort.    Heart:  +S1, +S2, Regular rate and rhythm, No edema. Abdomen:  Soft, nontender and nondistended. Normal bowel sounds, without guarding, and without rebound.   Neurologic:  Alert and  oriented x4;  grossly normal neurologically.  Impression/Plan: Renee Young is here for an colonoscopy to be performed for surveillance due to prior history of colon polyps   Risks, benefits, limitations, and alternatives regarding  colonoscopy have been reviewed with the patient.  Questions have been answered.  All parties agreeable.   Wyline Mood, MD  10/27/2022,  12:26 PM

## 2022-10-27 NOTE — Anesthesia Postprocedure Evaluation (Signed)
Anesthesia Post Note  Patient: Renee Young  Procedure(s) Performed: COLONOSCOPY WITH PROPOFOL POLYPECTOMY  Patient location during evaluation: Endoscopy Anesthesia Type: General Level of consciousness: awake and alert Pain management: pain level controlled Vital Signs Assessment: post-procedure vital signs reviewed and stable Respiratory status: spontaneous breathing, nonlabored ventilation, respiratory function stable and patient connected to nasal cannula oxygen Cardiovascular status: blood pressure returned to baseline and stable Postop Assessment: no apparent nausea or vomiting Anesthetic complications: no   No notable events documented.   Last Vitals:  Vitals:   10/27/22 1223  BP: (!) 161/67  Pulse: 83  Resp: 20  Temp: (!) 36.1 C  SpO2: 99%    Last Pain:  Vitals:   10/27/22 1223  TempSrc: Temporal  PainSc: 0-No pain                 Louie Boston

## 2022-10-28 ENCOUNTER — Encounter: Payer: Self-pay | Admitting: Gastroenterology

## 2022-10-28 LAB — SURGICAL PATHOLOGY

## 2022-11-02 DIAGNOSIS — M25551 Pain in right hip: Secondary | ICD-10-CM | POA: Diagnosis not present

## 2022-11-02 DIAGNOSIS — M25561 Pain in right knee: Secondary | ICD-10-CM | POA: Diagnosis not present

## 2022-11-02 DIAGNOSIS — R262 Difficulty in walking, not elsewhere classified: Secondary | ICD-10-CM | POA: Diagnosis not present

## 2022-11-05 DIAGNOSIS — M25561 Pain in right knee: Secondary | ICD-10-CM | POA: Diagnosis not present

## 2022-11-05 DIAGNOSIS — M25551 Pain in right hip: Secondary | ICD-10-CM | POA: Diagnosis not present

## 2022-11-05 DIAGNOSIS — R262 Difficulty in walking, not elsewhere classified: Secondary | ICD-10-CM | POA: Diagnosis not present

## 2022-11-08 DIAGNOSIS — M25551 Pain in right hip: Secondary | ICD-10-CM | POA: Diagnosis not present

## 2022-11-08 DIAGNOSIS — R262 Difficulty in walking, not elsewhere classified: Secondary | ICD-10-CM | POA: Diagnosis not present

## 2022-11-08 DIAGNOSIS — M25561 Pain in right knee: Secondary | ICD-10-CM | POA: Diagnosis not present

## 2022-11-16 DIAGNOSIS — M25561 Pain in right knee: Secondary | ICD-10-CM | POA: Diagnosis not present

## 2022-11-16 DIAGNOSIS — M25551 Pain in right hip: Secondary | ICD-10-CM | POA: Diagnosis not present

## 2022-11-16 DIAGNOSIS — R262 Difficulty in walking, not elsewhere classified: Secondary | ICD-10-CM | POA: Diagnosis not present

## 2022-11-18 DIAGNOSIS — R262 Difficulty in walking, not elsewhere classified: Secondary | ICD-10-CM | POA: Diagnosis not present

## 2022-11-18 DIAGNOSIS — M25551 Pain in right hip: Secondary | ICD-10-CM | POA: Diagnosis not present

## 2022-11-18 DIAGNOSIS — M25561 Pain in right knee: Secondary | ICD-10-CM | POA: Diagnosis not present

## 2022-11-23 DIAGNOSIS — M25561 Pain in right knee: Secondary | ICD-10-CM | POA: Diagnosis not present

## 2022-11-23 DIAGNOSIS — R262 Difficulty in walking, not elsewhere classified: Secondary | ICD-10-CM | POA: Diagnosis not present

## 2022-11-23 DIAGNOSIS — M25551 Pain in right hip: Secondary | ICD-10-CM | POA: Diagnosis not present

## 2022-11-25 DIAGNOSIS — M25561 Pain in right knee: Secondary | ICD-10-CM | POA: Diagnosis not present

## 2022-11-25 DIAGNOSIS — M25551 Pain in right hip: Secondary | ICD-10-CM | POA: Diagnosis not present

## 2022-11-25 DIAGNOSIS — R262 Difficulty in walking, not elsewhere classified: Secondary | ICD-10-CM | POA: Diagnosis not present

## 2022-11-26 DIAGNOSIS — M1711 Unilateral primary osteoarthritis, right knee: Secondary | ICD-10-CM | POA: Diagnosis not present

## 2022-11-30 DIAGNOSIS — R262 Difficulty in walking, not elsewhere classified: Secondary | ICD-10-CM | POA: Diagnosis not present

## 2022-11-30 DIAGNOSIS — M25561 Pain in right knee: Secondary | ICD-10-CM | POA: Diagnosis not present

## 2022-11-30 DIAGNOSIS — M25551 Pain in right hip: Secondary | ICD-10-CM | POA: Diagnosis not present

## 2022-12-02 DIAGNOSIS — R262 Difficulty in walking, not elsewhere classified: Secondary | ICD-10-CM | POA: Diagnosis not present

## 2022-12-02 DIAGNOSIS — M25551 Pain in right hip: Secondary | ICD-10-CM | POA: Diagnosis not present

## 2022-12-02 DIAGNOSIS — M25561 Pain in right knee: Secondary | ICD-10-CM | POA: Diagnosis not present

## 2022-12-06 ENCOUNTER — Telehealth: Payer: Self-pay | Admitting: Family Medicine

## 2022-12-06 NOTE — Telephone Encounter (Signed)
Lvm that per Dr patient has to have a surgical clearance appt .

## 2022-12-07 DIAGNOSIS — M25561 Pain in right knee: Secondary | ICD-10-CM | POA: Diagnosis not present

## 2022-12-07 DIAGNOSIS — R262 Difficulty in walking, not elsewhere classified: Secondary | ICD-10-CM | POA: Diagnosis not present

## 2022-12-07 DIAGNOSIS — M25551 Pain in right hip: Secondary | ICD-10-CM | POA: Diagnosis not present

## 2022-12-08 ENCOUNTER — Other Ambulatory Visit: Payer: Medicare PPO

## 2023-02-07 ENCOUNTER — Ambulatory Visit: Payer: Medicare PPO | Admitting: Nurse Practitioner

## 2023-02-07 ENCOUNTER — Encounter: Payer: Self-pay | Admitting: Nurse Practitioner

## 2023-02-07 VITALS — BP 114/72 | HR 88 | Temp 97.5°F | Resp 14 | Ht 64.5 in | Wt 168.1 lb

## 2023-02-07 DIAGNOSIS — Z780 Asymptomatic menopausal state: Secondary | ICD-10-CM | POA: Diagnosis not present

## 2023-02-07 DIAGNOSIS — Z7984 Long term (current) use of oral hypoglycemic drugs: Secondary | ICD-10-CM

## 2023-02-07 DIAGNOSIS — E1165 Type 2 diabetes mellitus with hyperglycemia: Secondary | ICD-10-CM

## 2023-02-07 DIAGNOSIS — Z01818 Encounter for other preprocedural examination: Secondary | ICD-10-CM | POA: Diagnosis not present

## 2023-02-07 DIAGNOSIS — Z0289 Encounter for other administrative examinations: Secondary | ICD-10-CM

## 2023-02-07 DIAGNOSIS — Z13820 Encounter for screening for osteoporosis: Secondary | ICD-10-CM

## 2023-02-07 NOTE — Progress Notes (Signed)
BP 114/72 (BP Location: Right Arm, Patient Position: Sitting, Cuff Size: Normal)   Pulse 88   Temp (!) 97.5 F (36.4 C) (Oral)   Resp 14   Ht 5' 4.5" (1.638 m) Comment: per patient  Wt 168 lb 1.6 oz (76.2 kg)   SpO2 100%   BMI 28.41 kg/m    Subjective:    Patient ID: Renee Young, female    DOB: [birth year], 77 y.o.   MRN: [id number]  HPI: Renee Young is a 77 y.o. female  Chief Complaint  Patient presents with   Medical Management of Chronic Issues    Discussed the use of AI scribe software for clinical note transcription with the patient, who gave verbal consent to proceed.  History of Present Illness   The patient, with a history of type 2 diabetes and hyperlipidemia, is scheduled for a total knee arthroplasty on the right knee. The patient's diabetes is well-controlled with Januvia, with the last A1c at 6.2. The patient has had a successful knee arthroplasty in the past and is optimistic about the upcoming procedure. The patient checks blood sugar levels from time to time and reports that it is usually well-controlled. The patient was more active before the weather turned cold. The patient is scheduled to have lab work done at [facility] on the [address].       02/07/2023    9:12 AM 09/20/2022    2:45 PM 04/29/2022    8:12 AM  Depression screen PHQ 2/9  Decreased Interest 0 1 0  Down, Depressed, Hopeless 0 0 0  PHQ - 2 Score 0 1 0  Altered sleeping 0 0 0  Tired, decreased energy 0 0 0  Change in appetite 0 0 0  Feeling bad or failure about yourself  0 0 0  Trouble concentrating 0 0 0  Moving slowly or fidgety/restless  0 0  Suicidal thoughts 0 0 0  PHQ-9 Score 0 1 0  Difficult doing work/chores  Not difficult at all Not difficult at all    Relevant past medical, surgical, family and social history reviewed and updated as indicated. Interim medical history since our last visit reviewed. Allergies and medications reviewed and updated.  Review of  Systems  Constitutional: Negative for fever or weight change.  Respiratory: Negative for cough and shortness of breath.   Cardiovascular: Negative for chest pain or palpitations.  Gastrointestinal: Negative for abdominal pain, no bowel changes.  Musculoskeletal: Negative for gait problem or joint swelling.  Skin: Negative for rash.  Neurological: Negative for dizziness or headache.  No other specific complaints in a complete review of systems (except as listed in HPI above).      Objective:    BP 114/72 (BP Location: Right Arm, Patient Position: Sitting, Cuff Size: Normal)   Pulse 88   Temp (!) 97.5 F (36.4 C) (Oral)   Resp 14   Ht 5' 4.5" (1.638 m) Comment: per patient  Wt 168 lb 1.6 oz (76.2 kg)   SpO2 100%   BMI 28.41 kg/m    Wt Readings from Last 3 Encounters:  02/07/23 168 lb 1.6 oz (76.2 kg)  10/27/22 162 lb 6.4 oz (73.7 kg)  10/26/22 160 lb (72.6 kg)    Physical Exam  Constitutional: Patient appears well-developed and well-nourished.  No distress.  HEENT: head atraumatic, normocephalic, pupils equal and reactive to light, neck supple Cardiovascular: Normal rate, regular rhythm and normal heart sounds.  No murmur heard. No BLE edema. Pulmonary/Chest:  Effort normal and breath sounds normal. No respiratory distress. Abdominal: Soft.  There is no tenderness. Psychiatric: Patient has a normal mood and affect. behavior is normal. Judgment and thought content normal.   Results for orders placed or performed during the hospital encounter of 10/27/22  Surgical pathology   Collection Time: 10/27/22 12:00 AM  Result Value Ref Range   SURGICAL PATHOLOGY      SURGICAL PATHOLOGY [facility] [address] [place], [place] [address] Telephone [phone] or [phone] Fax [phone]  REPORT OF SURGICAL PATHOLOGY   Accession #: [id number] Patient Name: Renee Young Visit # : [id number]  MRN: [id number] Physician: Wyline Mood DOB/Age 07/22/1945 (Age: 28) Gender: F Collected Date: 10/27/2022 Received Date: 10/27/2022  FINAL DIAGNOSIS       1. Sigmoid  Colon Polyp, Cold snare :       - TUBULAR ADENOMA      - NEGATIVE FOR HIGH-GRADE DYSPLASIA OR MALIGNANCY       DATE SIGNED OUT: 10/28/2022 ELECTRONIC SIGNATURE : Corey Harold M.D., Dossie Arbour., Pathologist, Electronic Signature  MICROSCOPIC DESCRIPTION  CASE COMMENTS STAINS USED IN DIAGNOSIS: H&E    CLINICAL HISTORY  SPECIMEN(S) OBTAINED 1. Sigmoid  Colon Polyp, Cold Snare  SPECIMEN COMMENTS: SPECIMEN CLINICAL INFORMATION: 1. History of colon polyp.  Colon polyp    Gross Description 1. Received in formalin are tan, soft  tissue fragments that are submitted in toto.  Number:  one,   Size:  0.8 cm  (KL:gt, 10/27/22)        Report signed out from the following location(s) [place] [address], [place] [address] CLIA #: [id number]  [facility] [address] [place], [place] [address] CLIA #: [id number]   Glucose, capillary   Collection Time: 10/27/22 12:24 PM  Result Value Ref Range   Glucose-Capillary 77 70 - 99 mg/dL       Assessment & Plan:   Problem List Items Addressed This Visit       Endocrine   Type 2 diabetes mellitus with hyperglycemia, without long-term current use of insulin (HCC)   Other Visit Diagnoses       Preoperative clearance    -  Primary     Encounter for osteoporosis screening in asymptomatic postmenopausal patient       Relevant Orders   DG Bone Density        Assessment and Plan    Total Knee Arthroplasty Scheduled for right knee arthroplasty on 02/28/2023. Chales Abrahams perioperative risk for myocardial infarction or cardiac arrest is 0.2%. Previous knee arthroplasty had a good outcome. -Patient is medically optimized for surgery pending lab results.  Type 2 Diabetes Well-controlled on Januvia. Last A1c was 6.2 in August 2024. -Continue current  management.        Follow up plan: No follow-ups on file.

## 2023-02-11 ENCOUNTER — Other Ambulatory Visit: Payer: Self-pay | Admitting: Family Medicine

## 2023-02-11 DIAGNOSIS — E1165 Type 2 diabetes mellitus with hyperglycemia: Secondary | ICD-10-CM

## 2023-02-15 NOTE — H&P (Signed)
 TOTAL KNEE ADMISSION H&P  Patient is being admitted for right total knee arthroplasty.  Subjective:  Chief Complaint: Right knee pain.  HPI: Renee Young, 77 y.o. female has a history of pain and functional disability in the right knee due to arthritis and has failed non-surgical conservative treatments for greater than 12 weeks to include NSAID's and/or analgesics, corticosteriod injections, viscosupplementation injections, and activity modification. Onset of symptoms was gradual, starting several years ago with gradually worsening course since that time. The patient noted no past surgery on the right knee.  Patient currently rates pain in the right knee at 8 out of 10 with activity. Patient has night pain, worsening of pain with activity and weight bearing, and pain that interferes with activities of daily living. Patient has evidence of  bone-on-bone in the medial and patellofemoral compartments with massive osteophyte formation  by imaging studies. There is no active infection.  Patient Active Problem List   Diagnosis Date Noted   Adenomatous polyp of colon 10/27/2022   MDD (major depressive disorder), recurrent, in partial remission (HCC) 01/06/2021   S/P total knee arthroplasty 10/08/2019   Microcytic erythrocytes 09/24/2019   Family history of thyroid disease 05/30/2019   Primary osteoarthritis of both knees 05/30/2019   History of colonic polyps 12/17/2016   Type 2 diabetes mellitus with hyperglycemia, without long-term current use of insulin  (HCC) 07/19/2007   Mixed hyperlipidemia 07/19/2007   Attention deficit disorder (ADD) 07/19/2007    Past Medical History:  Diagnosis Date   ADD (attention deficit disorder)    Allergy    Anemia    Arthritis    Bilateral temporomandibular joint pain 05/30/2019   Chicken pox    Colon polyps    Depression    Diabetes mellitus without complication (HCC)    GERD (gastroesophageal reflux disease)    Kidney stones    Phlebitis    UTI  (urinary tract infection)    UTI (urinary tract infection)    Varicose veins of bilateral lower extremities with other complications 07/03/2012    Past Surgical History:  Procedure Laterality Date   CESAREAN SECTION     COLONOSCOPY WITH PROPOFOL  N/A 10/27/2022   Procedure: COLONOSCOPY WITH PROPOFOL ;  Surgeon: Therisa Bi, MD;  Location: [facility] ENDOSCOPY;  Service: Gastroenterology;  Laterality: N/A;   POLYPECTOMY  10/27/2022   Procedure: POLYPECTOMY;  Surgeon: Therisa Bi, MD;  Location: [facility] ENDOSCOPY;  Service: Gastroenterology;;   TOTAL KNEE ARTHROPLASTY Left 10/08/2019   Procedure: TOTAL KNEE ARTHROPLASTY;  Surgeon: Melodi Lerner, MD;  Location: [facility] ORS;  Service: Orthopedics;  Laterality: Left;   TUBAL LIGATION     URETEROCELE INCISION     VASCULAR SURGERY     varicose vein laser surgery    Prior to Admission medications   Medication Sig Start Date End Date Taking? Authorizing Provider  ACCU-CHEK GUIDE test strip  11/28/19   [provider]  Accu-Chek Softclix Lancets lancets  11/28/19   [provider]  amphetamine -dextroamphetamine  (ADDERALL) 30 MG tablet Take 1 tablet by mouth 2 (two) times daily. Patient not taking: Reported on 02/07/2023 09/26/21   [provider]  blood glucose meter kit and supplies KIT Dispense based on patient and insurance preference. Use up to four times daily as directed. (FOR ICD-9 250.00, 250.01). 10/26/19   Kerrin Curly BIRCH, MD  cholecalciferol (VITAMIN D) 1000 units tablet Take 1,000 Units by mouth daily.    [provider]  FLUoxetine  (PROZAC ) 20 MG capsule Take 40 mg by mouth at bedtime.  [provider]  meloxicam  (MOBIC ) 7.5 MG tablet Take 1 tablet twice a day by oral route with meal(s). 11/26/22   [provider]  polyethylene glycol-electrolytes (NULYTELY ) 420 g solution TAKE 4,000 ML'S BY MOUTH ONCE FOR 1 DOSE    [provider]  Red Yeast Rice Extract (RED YEAST RICE PO) Take  by mouth.    [provider]  sitaGLIPtin  (JANUVIA ) 25 MG tablet TAKE 1 TABLET EVERY DAY 02/11/23   Tapia, Leisa, PA-C  triamcinolone  (KENALOG ) 0.025 % ointment Apply 1 Application topically 2 (two) times daily as needed (raised itchy patches and rash). 10/27/21   Tapia, Leisa, PA-C    Allergies  Allergen Reactions   Buckwheat Other (See Comments)   Nitrofurantoin Hives and Swelling   Penicillins Swelling    Tolerated Cephalosporin Date: 10/09/19.      Social History   Socioeconomic History   Marital status: Married    Spouse name: Not on file   Number of children: Not on file   Years of education: Not on file   Highest education level: Not on file  Occupational History   Not on file  Tobacco Use   Smoking status: Never   Smokeless tobacco: Never  Vaping Use   Vaping status: Never Used  Substance and Sexual Activity   Alcohol use: Yes    Alcohol/week: 1.0 standard drink of alcohol    Types: 1 Glasses of wine per week    Comment: occas.   Drug use: No   Sexual activity: Yes  Other Topics Concern   Not on file  Social History Narrative   Married   Retired Financial Trader   Social Drivers of Health   Financial Resource Strain: Low Risk  (09/20/2022)   Overall Financial Resource Strain (CARDIA)    Difficulty of Paying Living Expenses: Not hard at all  Food Insecurity: No Food Insecurity (09/20/2022)   Hunger Vital Sign    Worried About Running Out of Food in the Last Year: Never true    Ran Out of Food in the Last Year: Never true  Transportation Needs: No Transportation Needs (09/20/2022)   PRAPARE - Administrator, Civil Service (Medical): No    Lack of Transportation (Non-Medical): No  Physical Activity: Insufficiently Active (09/20/2022)   Exercise Vital Sign    Days of Exercise per Week: 4 days    Minutes of Exercise per Session: 30 min  Stress: Stress Concern Present (09/20/2022)   [employer] of Occupational Health - Occupational  Stress Questionnaire    Feeling of Stress : Rather much  Social Connections: Moderately Isolated (09/20/2022)   Social Connection and Isolation Panel [NHANES]    Frequency of Communication with Friends and Family: More than three times a week    Frequency of Social Gatherings with Friends and Family: More than three times a week    Attends Religious Services: Never    Database Administrator or Organizations: No    Attends Banker Meetings: Never    Marital Status: Married  Catering Manager Violence: Not At Risk (09/20/2022)   Humiliation, Afraid, Rape, and Kick questionnaire    Fear of Current or Ex-Partner: No    Emotionally Abused: No    Physically Abused: No    Sexually Abused: No    Tobacco Use: Low Risk  (02/07/2023)   Patient History    Smoking Tobacco Use: Never    Smokeless Tobacco Use: Never    Passive Exposure:  Not on file   Social History   Substance and Sexual Activity  Alcohol Use Yes   Alcohol/week: 1.0 standard drink of alcohol   Types: 1 Glasses of wine per week   Comment: occas.    Family History  Problem Relation Age of Onset   Cancer Mother    Kidney disease Mother    Diabetes Father    Heart disease Father    Hyperlipidemia Father    Hypertension Father    Stroke Father    Arthritis Sister    Heart attack Sister    Obesity Brother    Arthritis Brother    Arthritis Daughter    Heart disease Maternal Grandmother    Stroke Maternal Grandmother    Hypertension Maternal Grandmother    Heart disease Maternal Grandfather    Hyperlipidemia Maternal Grandfather    Stroke Paternal Grandmother    Heart disease Paternal Grandmother    Heart disease Paternal Grandfather    Heart attack Paternal Grandfather     ROS  Objective:  Physical Exam: Well nourished and well developed.  General: Alert and oriented x3, cooperative and pleasant, no acute distress.  Head: normocephalic, atraumatic, neck supple.  Eyes: EOMI. Abdomen: non-tender to  palpation and soft, normoactive bowel sounds. Musculoskeletal: Right Knee Exam:  No effusion present. No swelling present.  The range of motion is: 5 to 120 degrees.  Marked crepitus on range of motion of the knee.  Positive medial joint line tenderness.  Positive lateral joint line tenderness.  The knee is stable.  Calves soft and nontender. Motor function intact in LE. Strength 5/5 LE bilaterally. Neuro: Distal pulses 2+. Sensation to light touch intact in LE.  Vital signs in last 24 hours:    Imaging Review Plain radiographs demonstrate moderate degenerative joint disease of the right knee. The overall alignment is neutral. The bone quality appears to be adequate for age and reported activity level.  Assessment/Plan:  End stage arthritis, right knee   The patient history, physical examination, clinical judgment of the provider and imaging studies are consistent with end stage degenerative joint disease of the right knee and total knee arthroplasty is deemed medically necessary. The treatment options including medical management, injection therapy arthroscopy and arthroplasty were discussed at length. The risks and benefits of total knee arthroplasty were presented and reviewed. The risks due to aseptic loosening, infection, stiffness, patella tracking problems, thromboembolic complications and other imponderables were discussed. The patient acknowledged the explanation, agreed to proceed with the plan and consent was signed. Patient is being admitted for inpatient treatment for surgery, pain control, PT, OT, prophylactic antibiotics, VTE prophylaxis, progressive ambulation and ADLs and discharge planning. The patient is planning to be discharged  home .  Patient's anticipated LOS is less than 2 midnights, meeting these requirements: - Lives within 1 hour of care - Has a competent adult at home to recover with post-op - NO history of  - Chronic pain requiring opiods  - Diabetes  -  Coronary Artery Disease  - Heart failure  - Heart attack  - Stroke  - DVT/VTE  - Cardiac arrhythmia  - Respiratory Failure/COPD  - Renal failure  - Anemia  - Advanced Liver disease  Therapy Plans: [facility] () Disposition: Home with Husband Planned DVT Prophylaxis: Aspirin  81 mg BID DME Needed: None PCP: Michelene Cower, PA-C (clearance received) TXA: IV Allergies: penicillins (edema), nitrofurantoin (hives) Anesthesia Concerns: None BMI: 28.4 Last HgbA1c: not diabetic  [facility] [address]  Other: -  Had dizziness with last TKA - likely from muscle relaxer (methocarbamol ). Discussed flexeril  post-op instead -Hx superficial blood clot in 2015 - did not require treatment. Had IV TXA with last TKA  - Patient was instructed on what medications to stop prior to surgery. - Follow-up visit in 2 weeks with Dr. Melodi - Begin physical therapy following surgery - Pre-operative lab work as pre-surgical testing - Prescriptions will be provided in hospital at time of discharge  R. Zelda Kobs, PA-C Orthopedic Surgery [facility] [place]

## 2023-02-17 ENCOUNTER — Encounter (HOSPITAL_COMMUNITY): Payer: Self-pay

## 2023-02-17 NOTE — Progress Notes (Addendum)
 PCP - LOV 02-07-23 Gareth Clarity, FNP   preop clearance epic. Clearance form in media tab Cardiologist - no  PPM/ICD -  Device Orders -  Rep Notified -   Chest x-ray -  EKG -  Stress Test -  ECHO -  Cardiac Cath -   Sleep Study -  CPAP -   Fasting Blood Sugar - 100's Checks Blood Sugar __not every day___  Blood Thinner Instructions: Aspirin  Instructions:  ERAS Protcol - PRE-SURGERY G2-   COVID vaccine -yes  Activity--Able to complete ADL's without CP or SOB Anesthesia review: DM2    Patient denies shortness of breath, fever, cough and chest pain at PAT appointment   All instructions explained to the patient, with a verbal understanding of the material. Patient agrees to go over the instructions while at home for a better understanding. Patient also instructed to self quarantine after being tested for COVID-19. The opportunity to ask questions was provided.

## 2023-02-17 NOTE — Patient Instructions (Addendum)
 SURGICAL WAITING ROOM VISITATION  Patients having surgery or a procedure may have no more than 2 support people in the waiting area - these visitors may rotate.    Children under the age of 52 must have an adult with them who is not the patient.   If the patient needs to stay at the hospital during part of their recovery, the visitor guidelines for inpatient rooms apply. Pre-op nurse will coordinate an appropriate time for 1 support person to accompany patient in pre-op.  This support person may not rotate.    Please refer to the [facility] website for the visitor guidelines for Inpatients (after your surgery is over and you are in a regular room).       Your procedure is scheduled on: 02-28-23   Report to [facility] Main Entrance    Report to admitting at        [phone]  AM   Call this number if you have problems the morning of surgery [phone]   Do not eat food :After Midnight.   After Midnight you may have the following liquids until _0625 _____ AM/  DAY OF SURGERY   then nothing by mouth  Water  Non-Citrus Juices (without pulp, NO RED-Apple, White grape, White cranberry) Black Coffee (NO MILK/CREAM OR CREAMERS, sugar ok)  Clear Tea (NO MILK/CREAM OR CREAMERS, sugar ok) regular and decaf                             Plain Jell-O (NO RED)                                           Fruit ices (not with fruit pulp, NO RED)                                     Popsicles (NO RED)                                                               Sports drinks like Gatorade (NO RED)                     The day of surgery:  Drink ONE (1) Pre-Surgery  G2 at     0610   AM the morning of surgery. Drink in one sitting. Do not sip.  This drink was given to you during your hospital  pre-op appointment visit. Nothing else to drink after completing the  Pre-Surgery G2   by 9374 am .          If you have questions, please contact your surgeon's office.   FOLLOW  ANY ADDITIONAL  PRE OP INSTRUCTIONS YOU RECEIVED FROM YOUR SURGEON'S OFFICE!!!     Oral Hygiene is also important to reduce your risk of infection.                                    Remember - BRUSH YOUR TEETH THE MORNING OF SURGERY  WITH YOUR REGULAR TOOTHPASTE  DENTURES WILL BE REMOVED PRIOR TO SURGERY PLEASE DO NOT APPLY Poly grip OR ADHESIVES!!!   Do NOT smoke after Midnight   Stop all vitamins and herbal supplements 7 days before surgery.   Take these medicines the morning of surgery with A SIP OF WATER : NONE  DO NOT TAKE ANY ORAL DIABETIC MEDICATIONS DAY OF YOUR SURGERY  Bring CPAP mask and tubing day of surgery.                              You may not have any metal on your body including hair pins, jewelry, and body piercing             Do not wear make-up, lotions, powders, perfumes/cologne, or deodorant  Do not wear nail polish including gel and S&S, artificial/acrylic nails, or any other type of covering on natural nails including finger and toenails. If you have artificial nails, gel coating, etc. that needs to be removed by a nail salon please have this removed prior to surgery or surgery may need to be canceled/ delayed if the surgeon/ anesthesia feels like they are unable to be safely monitored.   Do not shave 5 days prior to surgery.                Do not bring valuables to the hospital. [facility] IS NOT             RESPONSIBLE   FOR VALUABLES.   Contacts, glasses, dentures or bridgework may not be worn into surgery.   Bring small overnight bag day of surgery.   DO NOT BRING YOUR HOME MEDICATIONS TO THE HOSPITAL. PHARMACY WILL DISPENSE MEDICATIONS LISTED ON YOUR MEDICATION LIST TO YOU DURING YOUR ADMISSION IN THE HOSPITAL!    Patients discharged on the day of surgery will not be allowed to drive home.  Someone NEEDS to stay with you for the first 24 hours after anesthesia.   Special Instructions: Bring a copy of your healthcare power of attorney and living will documents  the day of surgery if you haven't scanned them before.              Please read over the following fact sheets you were given: IF YOU HAVE QUESTIONS ABOUT YOUR PRE-OP INSTRUCTIONS PLEASE CALL [phone]    If you test positive for Covid or have been in contact with anyone that has tested positive in the last 10 days please notify you surgeon.      Pre-operative 5 CHG Bath Instructions   You can play a key role in reducing the risk of infection after surgery. Your skin needs to be as free of germs as possible. You can reduce the number of germs on your skin by washing with CHG (chlorhexidine  gluconate) soap before surgery. CHG is an antiseptic soap that kills germs and continues to kill germs even after washing.   DO NOT use if you have an allergy to chlorhexidine /CHG or antibacterial soaps. If your skin becomes reddened or irritated, stop using the CHG and notify one of our RNs at [phone].   Please shower with the CHG soap starting 4 days before surgery using the following schedule:     Please keep in mind the following:  DO NOT shave, including legs and underarms, starting the day of your first shower.   You may shave your face at any point before/day of surgery.  Place clean  sheets on your bed the day you start using CHG soap. Use a clean washcloth (not used since being washed) for each shower. DO NOT sleep with pets once you start using the CHG.   CHG Shower Instructions:  If you choose to wash your hair and private area, wash first with your normal shampoo/soap.  After you use shampoo/soap, rinse your hair and body thoroughly to remove shampoo/soap residue.  Turn the water  OFF and apply about 3 tablespoons (45 ml) of CHG soap to a CLEAN washcloth.  Apply CHG soap ONLY FROM YOUR NECK DOWN TO YOUR TOES (washing for 3-5 minutes)  DO NOT use CHG soap on face, private areas, open wounds, or sores.  Pay special attention to the area where your surgery is being performed.  If you  are having back surgery, having someone wash your back for you may be helpful. Wait 2 minutes after CHG soap is applied, then you may rinse off the CHG soap.  Pat dry with a clean towel  Put on clean clothes/pajamas   If you choose to wear lotion, please use ONLY the CHG-compatible lotions on the back of this paper.     Additional instructions for the day of surgery: DO NOT APPLY any lotions, deodorants, cologne, or perfumes.   Put on clean/comfortable clothes.  Brush your teeth.  Ask your nurse before applying any prescription medications to the skin.      CHG Compatible Lotions   Aveeno Moisturizing lotion  Cetaphil Moisturizing Cream  Cetaphil Moisturizing Lotion  Clairol Herbal Essence Moisturizing Lotion, Dry Skin  Clairol Herbal Essence Moisturizing Lotion, Extra Dry Skin  Clairol Herbal Essence Moisturizing Lotion, Normal Skin  Curel Age Defying Therapeutic Moisturizing Lotion with Alpha Hydroxy  Curel Extreme Care Body Lotion  Curel Soothing Hands Moisturizing Hand Lotion  Curel Therapeutic Moisturizing Cream, Fragrance-Free  Curel Therapeutic Moisturizing Lotion, Fragrance-Free  Curel Therapeutic Moisturizing Lotion, Original Formula  Eucerin Daily Replenishing Lotion  Eucerin Dry Skin Therapy Plus Alpha Hydroxy Crme  Eucerin Dry Skin Therapy Plus Alpha Hydroxy Lotion  Eucerin Original Crme  Eucerin Original Lotion  Eucerin Plus Crme Eucerin Plus Lotion  Eucerin TriLipid Replenishing Lotion  Keri Anti-Bacterial Hand Lotion  Keri Deep Conditioning Original Lotion Dry Skin Formula Softly Scented  Keri Deep Conditioning Original Lotion, Fragrance Free Sensitive Skin Formula  Keri Lotion Fast Absorbing Fragrance Free Sensitive Skin Formula  Keri Lotion Fast Absorbing Softly Scented Dry Skin Formula  Keri Original Lotion  Keri Skin Renewal Lotion Keri Silky Smooth Lotion  Keri Silky Smooth Sensitive Skin Lotion  Nivea Body Creamy Conditioning Oil  Nivea Body Extra  Enriched Lotion  Nivea Body Original Lotion  Nivea Body Sheer Moisturizing Lotion Nivea Crme  Nivea Skin Firming Lotion  NutraDerm 30 Skin Lotion  NutraDerm Skin Lotion  NutraDerm Therapeutic Skin Cream  NutraDerm Therapeutic Skin Lotion  ProShield Protective Hand Cream   Incentive Spirometer  An incentive spirometer is a tool that can help keep your lungs clear and active. This tool measures how well you are filling your lungs with each breath. Taking long deep breaths may help reverse or decrease the chance of developing breathing (pulmonary) problems (especially infection) following: A long period of time when you are unable to move or be active. BEFORE THE PROCEDURE  If the spirometer includes an indicator to show your best effort, your nurse or respiratory therapist will set it to a desired goal. If possible, sit up straight or lean slightly forward. Try not to slouch.  Hold the incentive spirometer in an upright position. INSTRUCTIONS FOR USE  Sit on the edge of your bed if possible, or sit up as far as you can in bed or on a chair. Hold the incentive spirometer in an upright position. Breathe out normally. Place the mouthpiece in your mouth and seal your lips tightly around it. Breathe in slowly and as deeply as possible, raising the piston or the ball toward the top of the column. Hold your breath for 3-5 seconds or for as long as possible. Allow the piston or ball to fall to the bottom of the column. Remove the mouthpiece from your mouth and breathe out normally. Rest for a few seconds and repeat Steps 1 through 7 at least 10 times every 1-2 hours when you are awake. Take your time and take a few normal breaths between deep breaths. The spirometer may include an indicator to show your best effort. Use the indicator as a goal to work toward during each repetition. After each set of 10 deep breaths, practice coughing to be sure your lungs are clear. If you have an incision (the  cut made at the time of surgery), support your incision when coughing by placing a pillow or rolled up towels firmly against it. Once you are able to get out of bed, walk around indoors and cough well. You may stop using the incentive spirometer when instructed by your caregiver.  RISKS AND COMPLICATIONS Take your time so you do not get dizzy or light-headed. If you are in pain, you may need to take or ask for pain medication before doing incentive spirometry. It is harder to take a deep breath if you are having pain. AFTER USE Rest and breathe slowly and easily. It can be helpful to keep track of a log of your progress. Your caregiver can provide you with a simple table to help with this. If you are using the spirometer at home, follow these instructions: SEEK MEDICAL CARE IF:  You are having difficultly using the spirometer. You have trouble using the spirometer as often as instructed. Your pain medication is not giving enough relief while using the spirometer. You develop fever of 100.5 F (38.1 C) or higher. SEEK IMMEDIATE MEDICAL CARE IF:  You cough up bloody sputum that had not been present before. You develop fever of 102 F (38.9 C) or greater. You develop worsening pain at or near the incision site. MAKE SURE YOU:  Understand these instructions. Will watch your condition. Will get help right away if you are not doing well or get worse. Document Released: 06/14/2006 Document Revised: 04/26/2011 Document Reviewed: 08/15/2006 [facility] Patient Information 2014 [place], [place].   ________________________________________________________________________

## 2023-02-22 ENCOUNTER — Ambulatory Visit: Payer: Medicare PPO | Admitting: Nurse Practitioner

## 2023-02-22 ENCOUNTER — Encounter: Payer: Self-pay | Admitting: Nurse Practitioner

## 2023-02-22 VITALS — BP 118/82 | HR 95 | Temp 97.9°F | Resp 18 | Ht 64.5 in | Wt 173.2 lb

## 2023-02-22 DIAGNOSIS — D492 Neoplasm of unspecified behavior of bone, soft tissue, and skin: Secondary | ICD-10-CM

## 2023-02-22 NOTE — Progress Notes (Signed)
 BP 118/82   Pulse 95   Temp 97.9 F (36.6 C)   Resp 18   Ht 5' 4.5 (1.638 m)   Wt 173 lb 3.2 oz (78.6 kg)   SpO2 94%   BMI 29.27 kg/m    Subjective:    Patient ID: Renee Young, female    DOB: [birth year], 78 y.o.   MRN: [id number]  HPI: Renee Young is a 78 y.o. female  Chief Complaint  Patient presents with   spot in nose    Left sore in nostril    Discussed the use of AI scribe software for clinical note transcription with the patient, who gave verbal consent to proceed.  History of Present Illness   The patient presents with a six-month history of a bothersome nasal lesion. The lesion is located in the left nostril and has been a source of discomfort. The patient reports that the lesion occasionally bleeds and then returns. She denies any pain, nasal obstruction, or changes in sense of smell.       02/07/2023    9:12 AM 09/20/2022    2:45 PM 04/29/2022    8:12 AM  Depression screen PHQ 2/9  Decreased Interest 0 1 0  Down, Depressed, Hopeless 0 0 0  PHQ - 2 Score 0 1 0  Altered sleeping 0 0 0  Tired, decreased energy 0 0 0  Change in appetite 0 0 0  Feeling bad or failure about yourself  0 0 0  Trouble concentrating 0 0 0  Moving slowly or fidgety/restless  0 0  Suicidal thoughts 0 0 0  PHQ-9 Score 0 1 0  Difficult doing work/chores  Not difficult at all Not difficult at all    Relevant past medical, surgical, family and social history reviewed and updated as indicated. Interim medical history since our last visit reviewed. Allergies and medications reviewed and updated.  Review of Systems  Ten systems reviewed and is negative except as mentioned in HPI      Objective:    BP 118/82   Pulse 95   Temp 97.9 F (36.6 C)   Resp 18   Ht 5' 4.5 (1.638 m)   Wt 173 lb 3.2 oz (78.6 kg)   SpO2 94%   BMI 29.27 kg/m    Wt Readings from Last 3 Encounters:  02/22/23 173 lb 3.2 oz (78.6 kg)  02/07/23 168 lb 1.6 oz (76.2 kg)  10/27/22 162 lb 6.4 oz (73.7 kg)     Physical Exam  Constitutional: Patient appears well-developed and well-nourished.  No distress.  HEENT: head atraumatic, normocephalic, pupils equal and reactive to light, nose left nostril has small growth just inside tip, neck supple, throat within normal limits Cardiovascular: Normal rate, regular rhythm and normal heart sounds.  No murmur heard. No BLE edema. Pulmonary/Chest: Effort normal and breath sounds normal. No respiratory distress. Abdominal: Soft.  There is no tenderness. Psychiatric: Patient has a normal mood and affect. behavior is normal. Judgment and thought content normal.  Results for orders placed or performed during the hospital encounter of 10/27/22  Surgical pathology   Collection Time: 10/27/22 12:00 AM  Result Value Ref Range   SURGICAL PATHOLOGY      SURGICAL PATHOLOGY [facility] [address] [place], [place] [address] Telephone [phone] or [phone] Fax [phone]  REPORT OF SURGICAL PATHOLOGY   Accession #: [id number] Patient Name: Renee Young Visit # : [id number]  MRN: [id number] Physician: Therisa,  Ruel DOB/Age 08-Sep-1945 (Age: 84) Gender: F Collected Date: 10/27/2022 Received Date: 10/27/2022  FINAL DIAGNOSIS       1. Sigmoid  Colon Polyp, Cold snare :       - TUBULAR ADENOMA      - NEGATIVE FOR HIGH-GRADE DYSPLASIA OR MALIGNANCY       DATE SIGNED OUT: 10/28/2022 ELECTRONIC SIGNATURE : Kin M.D., Annah SQUIBB., Pathologist, Electronic Signature  MICROSCOPIC DESCRIPTION  CASE COMMENTS STAINS USED IN DIAGNOSIS: H&E    CLINICAL HISTORY  SPECIMEN(S) OBTAINED 1. Sigmoid  Colon Polyp, Cold Snare  SPECIMEN COMMENTS: SPECIMEN CLINICAL INFORMATION: 1. History of colon polyp.  Colon polyp    Gross Description 1. Received in formalin are tan, soft  tissue fragments that are submitted in toto.  Number:  one,   Size:  0.8 cm  (KL:gt, 10/27/22)        Report signed out from the  following location(s) [place]. [facility] [address] [place], [place] [address] CLIA #: [id number]  [facility] [address] [place], [place] [address] CLIA #: [id number]   Glucose, capillary   Collection Time: 10/27/22 12:24 PM  Result Value Ref Range   Glucose-Capillary 77 70 - 99 mg/dL       Assessment & Plan:   Problem List Items Addressed This Visit   None Visit Diagnoses       Abnormal skin growth    -  Primary   Relevant Orders   Ambulatory referral to ENT        Assessment and Plan    Nasal Lesion Persistent lesion in the left nostril for approximately six months. The lesion intermittently bleeds and then recurs. -Refer to ENT for further evaluation and management.        Follow up plan: Return for AWV w/ nurse.

## 2023-02-23 ENCOUNTER — Encounter (HOSPITAL_COMMUNITY): Payer: Self-pay

## 2023-02-23 ENCOUNTER — Other Ambulatory Visit: Payer: Self-pay

## 2023-02-23 ENCOUNTER — Encounter (HOSPITAL_COMMUNITY)
Admission: RE | Admit: 2023-02-23 | Discharge: 2023-02-23 | Disposition: A | Discharge status: Home / Self Care | Payer: Medicare PPO | Source: Ambulatory Visit | Attending: Orthopedic Surgery | Admitting: Orthopedic Surgery

## 2023-02-23 VITALS — BP 150/67 | HR 91 | Temp 98.4°F | Resp 16 | Ht 64.0 in | Wt 167.0 lb

## 2023-02-23 DIAGNOSIS — E1165 Type 2 diabetes mellitus with hyperglycemia: Secondary | ICD-10-CM | POA: Diagnosis not present

## 2023-02-23 DIAGNOSIS — Z01818 Encounter for other preprocedural examination: Secondary | ICD-10-CM | POA: Insufficient documentation

## 2023-02-23 DIAGNOSIS — F411 Generalized anxiety disorder: Secondary | ICD-10-CM | POA: Diagnosis not present

## 2023-02-23 DIAGNOSIS — F902 Attention-deficit hyperactivity disorder, combined type: Secondary | ICD-10-CM | POA: Diagnosis not present

## 2023-02-23 DIAGNOSIS — F332 Major depressive disorder, recurrent severe without psychotic features: Secondary | ICD-10-CM | POA: Diagnosis not present

## 2023-02-23 HISTORY — DX: Personal history of urinary calculi: Z87.442

## 2023-02-23 HISTORY — DX: Personal history of other diseases of the digestive system: Z87.19

## 2023-02-23 LAB — BASIC METABOLIC PANEL
Anion gap: 9 (ref 5–15)
BUN: 20 mg/dL (ref 8–23)
CO2: 26 mmol/L (ref 22–32)
Calcium: 10.3 mg/dL (ref 8.9–10.3)
Chloride: 104 mmol/L (ref 98–111)
Creatinine, Ser: 0.68 mg/dL (ref 0.44–1.00)
GFR, Estimated: >60 mL/min (ref >60)
Glucose, Bld: 98 mg/dL (ref 70–99)
Potassium: 4.5 mmol/L (ref 3.5–5.1)
Sodium: 139 mmol/L (ref 135–145)

## 2023-02-23 LAB — CBC
HCT: 42.2 % (ref 36.0–46.0)
Hemoglobin: 13.1 g/dL (ref 12.0–15.0)
MCH: 25.8 pg — ABNORMAL LOW (ref 26.0–34.0)
MCHC: 31 g/dL (ref 30.0–36.0)
MCV: 83.1 fL (ref 80.0–100.0)
Platelets: 345 10*3/uL (ref 150–400)
RBC: 5.08 MIL/uL (ref 3.87–5.11)
RDW: 14.6 % (ref 11.5–15.5)
WBC: 6.1 10*3/uL (ref 4.0–10.5)
nRBC: 0 % (ref 0.0–0.2)

## 2023-02-23 LAB — SURGICAL PCR SCREEN
MRSA, PCR: NEGATIVE
Staphylococcus aureus: POSITIVE — AB

## 2023-02-23 LAB — GLUCOSE, CAPILLARY: Glucose-Capillary: 87 mg/dL (ref 70–99)

## 2023-02-24 LAB — HEMOGLOBIN A1C
Hgb A1c MFr Bld: 5.9 % — ABNORMAL HIGH (ref 4.8–5.6)
Mean Plasma Glucose: 123 mg/dL

## 2023-02-24 NOTE — Progress Notes (Signed)
 PCR results sent to Dr. Despina Hick to review.

## 2023-02-27 NOTE — Anesthesia Preprocedure Evaluation (Addendum)
 Anesthesia Evaluation  Patient identified by MRN, date of birth, ID band Patient awake    Reviewed: Allergy & Precautions, NPO status , Patient's Chart, lab work & pertinent test results  History of Anesthesia Complications Negative for: history of anesthetic complications  Airway Mallampati: I  TM Distance: >3 FB Neck ROM: Full    Dental  (+) Dental Advisory Given   Pulmonary neg pulmonary ROS   Pulmonary exam normal breath sounds clear to auscultation       Cardiovascular (-) hypertension(-) angina (-) Past MI, (-) Cardiac Stents and (-) CABG + dysrhythmias (RBBB)  Rhythm:Regular Rate:Normal     Neuro/Psych  PSYCHIATRIC DISORDERS (ADD)  Depression    negative neurological ROS     GI/Hepatic Neg liver ROS, hiatal hernia,GERD  ,,  Endo/Other  diabetes (Hgb A1c 5.9), Well Controlled, Type 2, Oral Hypoglycemic Agents    Renal/GU negative Renal ROS     Musculoskeletal  (+) Arthritis ,    Abdominal   Peds  Hematology  (+) Blood dyscrasia, anemia Lab Results      Component                Value               Date                      WBC                      6.1                 02/23/2023                HGB                      13.1                02/23/2023                HCT                      42.2                02/23/2023                MCV                      83.1                02/23/2023                PLT                      345                 02/23/2023              Anesthesia Other Findings Bilateral TMJ pain  Reproductive/Obstetrics                             Anesthesia Physical Anesthesia Plan  ASA: 2  Anesthesia Plan: Spinal and MAC   Post-op Pain Management: Regional block* and Tylenol  PO (pre-op)*   Induction: Intravenous  PONV Risk Score and Plan: 2 and Ondansetron , Dexamethasone , Treatment may vary due to age or medical condition and Propofol  infusion  Airway  Management Planned: Natural  Airway and Simple Face Mask  Additional Equipment:   Intra-op Plan:   Post-operative Plan:   Informed Consent: I have reviewed the patients History and Physical, chart, labs and discussed the procedure including the risks, benefits and alternatives for the proposed anesthesia with the patient or authorized representative who has indicated his/her understanding and acceptance.     Dental advisory given  Plan Discussed with: CRNA and Anesthesiologist  Anesthesia Plan Comments: (Discussed potential risks of nerve blocks including, but not limited to, infection, bleeding, nerve damage, seizures, pneumothorax, respiratory depression, and potential failure of the block. Alternatives to nerve blocks discussed. All questions answered.  I have discussed risks of neuraxial anesthesia including but not limited to infection, bleeding, nerve injury, back pain, headache, seizures, and failure of block. Patient denies bleeding disorders and is not currently anticoagulated. Labs have been reviewed. Risks and benefits discussed. All patient's questions answered.   Discussed with patient risks of MAC including, but not limited to, minor pain or discomfort, hearing people in the room, and possible need for backup general anesthesia. Risks for general anesthesia also discussed including, but not limited to, sore throat, hoarse voice, chipped/damaged teeth, injury to vocal cords, nausea and vomiting, allergic reactions, lung infection, heart attack, stroke, and death. All questions answered. )        Anesthesia Quick Evaluation

## 2023-02-28 ENCOUNTER — Ambulatory Visit (HOSPITAL_COMMUNITY): Payer: Self-pay | Admitting: Anesthesiology

## 2023-02-28 ENCOUNTER — Other Ambulatory Visit: Payer: Self-pay

## 2023-02-28 ENCOUNTER — Other Ambulatory Visit (HOSPITAL_COMMUNITY): Payer: Self-pay

## 2023-02-28 ENCOUNTER — Encounter (HOSPITAL_COMMUNITY)
Admission: RE | Disposition: A | Discharge status: Home / Self Care | Payer: Self-pay | Source: Ambulatory Visit | Attending: Orthopedic Surgery

## 2023-02-28 ENCOUNTER — Observation Stay (HOSPITAL_COMMUNITY)
Admission: RE | Admit: 2023-02-28 | Discharge: 2023-03-02 | Disposition: A | Discharge status: Home / Self Care | Payer: Medicare PPO | Source: Ambulatory Visit | Attending: Orthopedic Surgery | Admitting: Orthopedic Surgery

## 2023-02-28 ENCOUNTER — Encounter (HOSPITAL_COMMUNITY): Payer: Self-pay | Admitting: Orthopedic Surgery

## 2023-02-28 DIAGNOSIS — M179 Osteoarthritis of knee, unspecified: Principal | ICD-10-CM | POA: Diagnosis present

## 2023-02-28 DIAGNOSIS — Z79899 Other long term (current) drug therapy: Secondary | ICD-10-CM | POA: Diagnosis not present

## 2023-02-28 DIAGNOSIS — F909 Attention-deficit hyperactivity disorder, unspecified type: Secondary | ICD-10-CM | POA: Insufficient documentation

## 2023-02-28 DIAGNOSIS — E119 Type 2 diabetes mellitus without complications: Secondary | ICD-10-CM | POA: Diagnosis not present

## 2023-02-28 DIAGNOSIS — M1711 Unilateral primary osteoarthritis, right knee: Principal | ICD-10-CM | POA: Diagnosis present

## 2023-02-28 DIAGNOSIS — D649 Anemia, unspecified: Secondary | ICD-10-CM | POA: Diagnosis not present

## 2023-02-28 DIAGNOSIS — Z7984 Long term (current) use of oral hypoglycemic drugs: Secondary | ICD-10-CM | POA: Diagnosis not present

## 2023-02-28 DIAGNOSIS — Z87442 Personal history of urinary calculi: Secondary | ICD-10-CM | POA: Diagnosis not present

## 2023-02-28 DIAGNOSIS — K219 Gastro-esophageal reflux disease without esophagitis: Secondary | ICD-10-CM | POA: Diagnosis not present

## 2023-02-28 DIAGNOSIS — E782 Mixed hyperlipidemia: Secondary | ICD-10-CM | POA: Insufficient documentation

## 2023-02-28 DIAGNOSIS — Z96652 Presence of left artificial knee joint: Secondary | ICD-10-CM | POA: Diagnosis not present

## 2023-02-28 DIAGNOSIS — F3341 Major depressive disorder, recurrent, in partial remission: Secondary | ICD-10-CM | POA: Diagnosis not present

## 2023-02-28 DIAGNOSIS — G8918 Other acute postprocedural pain: Secondary | ICD-10-CM | POA: Diagnosis not present

## 2023-02-28 DIAGNOSIS — Z860100 Personal history of colon polyps, unspecified: Secondary | ICD-10-CM | POA: Insufficient documentation

## 2023-02-28 DIAGNOSIS — E1165 Type 2 diabetes mellitus with hyperglycemia: Secondary | ICD-10-CM

## 2023-02-28 HISTORY — PX: TOTAL KNEE ARTHROPLASTY: SHX125

## 2023-02-28 LAB — GLUCOSE, CAPILLARY
Glucose-Capillary: 105 mg/dL — ABNORMAL HIGH (ref 70–99)
Glucose-Capillary: 106 mg/dL — ABNORMAL HIGH (ref 70–99)
Glucose-Capillary: 180 mg/dL — ABNORMAL HIGH (ref 70–99)
Glucose-Capillary: 244 mg/dL — ABNORMAL HIGH (ref 70–99)

## 2023-02-28 SURGERY — ARTHROPLASTY, KNEE, TOTAL
Anesthesia: Monitor Anesthesia Care | Site: Knee | Laterality: Right

## 2023-02-28 MED ORDER — OXYCODONE HCL 5 MG PO TABS: 5.0000 mg | ORAL_TABLET | Freq: Once | ORAL | Status: DC | PRN

## 2023-02-28 MED ORDER — MUPIROCIN 2 % EX OINT
1.0000 | TOPICAL_OINTMENT | Freq: Two times a day (BID) | CUTANEOUS | 0 refills | Status: AC
  Filled 2023-02-28 – 2023-03-08 (×2): qty 66, 33d supply, fill #0

## 2023-02-28 MED ORDER — DEXAMETHASONE SODIUM PHOSPHATE 10 MG/ML IJ SOLN
8.0000 mg | Freq: Once | INTRAMUSCULAR | Status: AC
  Administered 2023-02-28: 8 mg via INTRAVENOUS

## 2023-02-28 MED ORDER — SODIUM CHLORIDE (PF) 0.9 % IJ SOLN
INTRAMUSCULAR | Status: AC
  Filled 2023-02-28: qty 10

## 2023-02-28 MED ORDER — ORAL CARE MOUTH RINSE: 15.0000 mL | Freq: Once | OROMUCOSAL | Status: AC

## 2023-02-28 MED ORDER — SODIUM CHLORIDE 0.9 % IV SOLN: INTRAVENOUS | Status: AC

## 2023-02-28 MED ORDER — ONDANSETRON HCL 4 MG/2ML IJ SOLN: 4.0000 mg | Freq: Four times a day (QID) | INTRAMUSCULAR | Status: DC | PRN

## 2023-02-28 MED ORDER — FENTANYL CITRATE PF 50 MCG/ML IJ SOSY
50.0000 ug | PREFILLED_SYRINGE | Freq: Once | INTRAMUSCULAR | Status: AC
  Administered 2023-02-28: 50 ug via INTRAVENOUS
  Filled 2023-02-28: qty 2

## 2023-02-28 MED ORDER — MORPHINE SULFATE (PF) 2 MG/ML IV SOLN: 1.0000 mg | INTRAVENOUS | Status: DC | PRN

## 2023-02-28 MED ORDER — LIDOCAINE HCL (CARDIAC) PF 100 MG/5ML IV SOSY
PREFILLED_SYRINGE | INTRAVENOUS | Status: DC | PRN
  Administered 2023-02-28: 60 mg via INTRAVENOUS

## 2023-02-28 MED ORDER — SODIUM CHLORIDE (PF) 0.9 % IJ SOLN
INTRAMUSCULAR | Status: DC | PRN
  Administered 2023-02-28: 80 mL

## 2023-02-28 MED ORDER — SODIUM CHLORIDE (PF) 0.9 % IJ SOLN
INTRAMUSCULAR | Status: AC
  Filled 2023-02-28: qty 50

## 2023-02-28 MED ORDER — CHLORHEXIDINE GLUCONATE 0.12 % MT SOLN
15.0000 mL | Freq: Once | OROMUCOSAL | Status: AC
  Administered 2023-02-28: 15 mL via OROMUCOSAL

## 2023-02-28 MED ORDER — INSULIN ASPART 100 UNIT/ML IJ SOLN
0.0000 [IU] | Freq: Three times a day (TID) | INTRAMUSCULAR | Status: DC
  Administered 2023-02-28 – 2023-03-01 (×2): 3 [IU] via SUBCUTANEOUS
  Administered 2023-03-01 – 2023-03-02 (×2): 2 [IU] via SUBCUTANEOUS

## 2023-02-28 MED ORDER — ACETAMINOPHEN 500 MG PO TABS: 1000.0000 mg | ORAL_TABLET | Freq: Once | ORAL | Status: DC

## 2023-02-28 MED ORDER — PROPOFOL 10 MG/ML IV BOLUS
INTRAVENOUS | Status: DC | PRN
  Administered 2023-02-28: 40 mg via INTRAVENOUS

## 2023-02-28 MED ORDER — SODIUM CHLORIDE 0.9 % IR SOLN
Status: DC | PRN
  Administered 2023-02-28: 1000 mL

## 2023-02-28 MED ORDER — POVIDONE-IODINE 10 % EX SWAB
2.0000 | Freq: Once | CUTANEOUS | Status: AC
  Administered 2023-02-28: 2 via TOPICAL

## 2023-02-28 MED ORDER — FENTANYL CITRATE PF 50 MCG/ML IJ SOSY: 25.0000 ug | PREFILLED_SYRINGE | INTRAMUSCULAR | Status: DC | PRN

## 2023-02-28 MED ORDER — OXYCODONE HCL 5 MG PO TABS
10.0000 mg | ORAL_TABLET | ORAL | Status: DC | PRN
  Administered 2023-02-28 – 2023-03-01 (×2): 10 mg via ORAL
  Filled 2023-02-28 (×2): qty 2

## 2023-02-28 MED ORDER — PROPOFOL 500 MG/50ML IV EMUL
INTRAVENOUS | Status: DC | PRN
  Administered 2023-02-28: 50 ug/kg/min via INTRAVENOUS

## 2023-02-28 MED ORDER — FLUOXETINE HCL 20 MG PO CAPS
40.0000 mg | ORAL_CAPSULE | Freq: Every day | ORAL | Status: DC
  Administered 2023-02-28 – 2023-03-01 (×2): 40 mg via ORAL
  Filled 2023-02-28 (×2): qty 2

## 2023-02-28 MED ORDER — METOCLOPRAMIDE HCL 5 MG/ML IJ SOLN
5.0000 mg | Freq: Three times a day (TID) | INTRAMUSCULAR | Status: DC | PRN
Start: 2023-02-28 — End: 2023-03-02
  Administered 2023-03-01: 10 mg via INTRAVENOUS
  Filled 2023-02-28: qty 2

## 2023-02-28 MED ORDER — FENTANYL CITRATE (PF) 100 MCG/2ML IJ SOLN
INTRAMUSCULAR | Status: AC
  Filled 2023-02-28: qty 2

## 2023-02-28 MED ORDER — ACETAMINOPHEN 500 MG PO TABS
1000.0000 mg | ORAL_TABLET | Freq: Four times a day (QID) | ORAL | Status: AC
  Administered 2023-02-28 – 2023-03-01 (×3): 1000 mg via ORAL
  Filled 2023-02-28 (×3): qty 2

## 2023-02-28 MED ORDER — BISACODYL 10 MG RE SUPP: 10.0000 mg | Freq: Every day | RECTAL | Status: DC | PRN

## 2023-02-28 MED ORDER — LINAGLIPTIN 5 MG PO TABS
5.0000 mg | ORAL_TABLET | Freq: Every day | ORAL | Status: DC
  Administered 2023-02-28 – 2023-03-02 (×3): 5 mg via ORAL
  Filled 2023-02-28 (×3): qty 1

## 2023-02-28 MED ORDER — MIDAZOLAM HCL 2 MG/2ML IJ SOLN
1.0000 mg | Freq: Once | INTRAMUSCULAR | Status: DC
Start: 2023-02-28 — End: 2023-02-28

## 2023-02-28 MED ORDER — ASPIRIN 81 MG PO CHEW
81.0000 mg | CHEWABLE_TABLET | Freq: Two times a day (BID) | ORAL | Status: DC
  Administered 2023-03-01 – 2023-03-02 (×3): 81 mg via ORAL
  Filled 2023-02-28 (×3): qty 1

## 2023-02-28 MED ORDER — OXYCODONE HCL 5 MG PO TABS
5.0000 mg | ORAL_TABLET | ORAL | Status: DC | PRN
  Administered 2023-02-28: 5 mg via ORAL
  Administered 2023-03-01 (×2): 10 mg via ORAL
  Administered 2023-03-02: 2.5 mg via ORAL
  Filled 2023-02-28 (×2): qty 2
  Filled 2023-02-28: qty 1
  Filled 2023-02-28 (×2): qty 2

## 2023-02-28 MED ORDER — DIPHENHYDRAMINE HCL 12.5 MG/5ML PO ELIX: 12.5000 mg | ORAL_SOLUTION | ORAL | Status: DC | PRN

## 2023-02-28 MED ORDER — CYCLOBENZAPRINE HCL 5 MG PO TABS: 5.0000 mg | ORAL_TABLET | Freq: Three times a day (TID) | ORAL | Status: DC | PRN

## 2023-02-28 MED ORDER — ONDANSETRON HCL 4 MG PO TABS: 4.0000 mg | ORAL_TABLET | Freq: Four times a day (QID) | ORAL | Status: DC | PRN

## 2023-02-28 MED ORDER — BUPIVACAINE LIPOSOME 1.3 % IJ SUSP: 20.0000 mL | Freq: Once | INTRAMUSCULAR | Status: DC

## 2023-02-28 MED ORDER — CEFAZOLIN SODIUM-DEXTROSE 2-4 GM/100ML-% IV SOLN
2.0000 g | INTRAVENOUS | Status: AC
  Administered 2023-02-28: 2 g via INTRAVENOUS
  Filled 2023-02-28: qty 100

## 2023-02-28 MED ORDER — ACETAMINOPHEN 10 MG/ML IV SOLN
1000.0000 mg | Freq: Four times a day (QID) | INTRAVENOUS | Status: DC
  Administered 2023-02-28: 1000 mg via INTRAVENOUS
  Filled 2023-02-28: qty 100

## 2023-02-28 MED ORDER — STERILE WATER FOR IRRIGATION IR SOLN
Status: DC | PRN
  Administered 2023-02-28: 1000 mL

## 2023-02-28 MED ORDER — INSULIN ASPART 100 UNIT/ML IJ SOLN: 0.0000 [IU] | INTRAMUSCULAR | Status: DC | PRN

## 2023-02-28 MED ORDER — AMPHETAMINE-DEXTROAMPHETAMINE 10 MG PO TABS
30.0000 mg | ORAL_TABLET | Freq: Every morning | ORAL | Status: DC
  Administered 2023-03-01: 20 mg via ORAL
  Filled 2023-02-28: qty 3

## 2023-02-28 MED ORDER — CHLORHEXIDINE GLUCONATE 4 % EX SOLN
1.0000 | CUTANEOUS | 1 refills | Status: DC
  Filled 2023-02-28 – 2023-03-08 (×2): qty 946, 30d supply, fill #0

## 2023-02-28 MED ORDER — PROPOFOL 500 MG/50ML IV EMUL
INTRAVENOUS | Status: AC
Start: 2023-02-28 — End: ?
  Filled 2023-02-28: qty 50

## 2023-02-28 MED ORDER — TRANEXAMIC ACID-NACL 1000-0.7 MG/100ML-% IV SOLN
1000.0000 mg | INTRAVENOUS | Status: AC
  Administered 2023-02-28: 1000 mg via INTRAVENOUS
  Filled 2023-02-28: qty 100

## 2023-02-28 MED ORDER — ROPIVACAINE HCL 5 MG/ML IJ SOLN
INTRAMUSCULAR | Status: DC | PRN
  Administered 2023-02-28: 20 mL via PERINEURAL

## 2023-02-28 MED ORDER — POLYETHYLENE GLYCOL 3350 17 G PO PACK: 17.0000 g | PACK | Freq: Every day | ORAL | Status: DC | PRN

## 2023-02-28 MED ORDER — CEFAZOLIN SODIUM-DEXTROSE 2-4 GM/100ML-% IV SOLN
2.0000 g | Freq: Four times a day (QID) | INTRAVENOUS | Status: AC
  Administered 2023-02-28 (×2): 2 g via INTRAVENOUS
  Filled 2023-02-28 (×2): qty 100

## 2023-02-28 MED ORDER — OXYCODONE HCL 5 MG/5ML PO SOLN: 5.0000 mg | Freq: Once | ORAL | Status: DC | PRN

## 2023-02-28 MED ORDER — PHENOL 1.4 % MT LIQD: 1.0000 | OROMUCOSAL | Status: DC | PRN

## 2023-02-28 MED ORDER — FENTANYL CITRATE (PF) 100 MCG/2ML IJ SOLN
INTRAMUSCULAR | Status: DC | PRN
  Administered 2023-02-28: 12.5 ug via INTRAVENOUS
  Administered 2023-02-28: 50 ug via INTRAVENOUS
  Administered 2023-02-28 (×3): 12.5 ug via INTRAVENOUS

## 2023-02-28 MED ORDER — METOCLOPRAMIDE HCL 5 MG PO TABS
5.0000 mg | ORAL_TABLET | Freq: Three times a day (TID) | ORAL | Status: DC | PRN
Start: 2023-02-28 — End: 2023-03-02

## 2023-02-28 MED ORDER — LACTATED RINGERS IV SOLN: INTRAVENOUS | Status: DC

## 2023-02-28 MED ORDER — BUPIVACAINE LIPOSOME 1.3 % IJ SUSP
INTRAMUSCULAR | Status: AC
  Filled 2023-02-28: qty 20

## 2023-02-28 MED ORDER — DOCUSATE SODIUM 100 MG PO CAPS
100.0000 mg | ORAL_CAPSULE | Freq: Two times a day (BID) | ORAL | Status: DC
  Administered 2023-02-28 – 2023-03-02 (×4): 100 mg via ORAL
  Filled 2023-02-28 (×4): qty 1

## 2023-02-28 MED ORDER — 0.9 % SODIUM CHLORIDE (POUR BTL) OPTIME
TOPICAL | Status: DC | PRN
  Administered 2023-02-28: 1000 mL

## 2023-02-28 MED ORDER — ACETAMINOPHEN 325 MG PO TABS
325.0000 mg | ORAL_TABLET | Freq: Four times a day (QID) | ORAL | Status: DC | PRN
Start: 2023-03-01 — End: 2023-03-02
  Administered 2023-03-02: 650 mg via ORAL
  Filled 2023-02-28: qty 2

## 2023-02-28 MED ORDER — MENTHOL 3 MG MT LOZG: 1.0000 | LOZENGE | OROMUCOSAL | Status: DC | PRN

## 2023-02-28 MED ORDER — DEXAMETHASONE SODIUM PHOSPHATE 10 MG/ML IJ SOLN
INTRAMUSCULAR | Status: AC
  Filled 2023-02-28: qty 1

## 2023-02-28 MED ORDER — BUPIVACAINE IN DEXTROSE 0.75-8.25 % IT SOLN
INTRATHECAL | Status: DC | PRN
  Administered 2023-02-28: 1.4 mL via INTRATHECAL

## 2023-02-28 MED ORDER — AMISULPRIDE (ANTIEMETIC) 5 MG/2ML IV SOLN: 10.0000 mg | Freq: Once | INTRAVENOUS | Status: DC | PRN

## 2023-02-28 MED ORDER — FLEET ENEMA RE ENEM: 1.0000 | ENEMA | Freq: Once | RECTAL | Status: DC | PRN

## 2023-02-28 SURGICAL SUPPLY — 44 items
ATTUNE PSFEM RTSZ5 NARCEM KNEE (Femur) IMPLANT
ATTUNE PSRP INSR SZ5 8 KNEE (Insert) IMPLANT
BAG COUNTER SPONGE SURGICOUNT (BAG) IMPLANT
BAG ZIPLOCK 12X15 (MISCELLANEOUS) ×2 IMPLANT
BASE TIBIAL ROT PLAT SZ 5 KNEE (Knees) IMPLANT
BLADE SAG 18X100X1.27 (BLADE) ×2 IMPLANT
BLADE SAW SGTL 11.0X1.19X90.0M (BLADE) ×2 IMPLANT
BNDG ELASTIC 6INX 5YD STR LF (GAUZE/BANDAGES/DRESSINGS) ×2 IMPLANT
BOWL SMART MIX CTS (DISPOSABLE) ×2 IMPLANT
CEMENT HV SMART SET (Cement) ×4 IMPLANT
COVER SURGICAL LIGHT HANDLE (MISCELLANEOUS) ×2 IMPLANT
CUFF TRNQT CYL 34X4.125X (TOURNIQUET CUFF) ×2 IMPLANT
DERMABOND ADVANCED .7 DNX12 (GAUZE/BANDAGES/DRESSINGS) ×2 IMPLANT
DRAPE U-SHAPE 47X51 STRL (DRAPES) ×2 IMPLANT
DRSG AQUACEL AG ADV 3.5X10 (GAUZE/BANDAGES/DRESSINGS) ×2 IMPLANT
DURAPREP 26ML APPLICATOR (WOUND CARE) ×2 IMPLANT
ELECT REM PT RETURN 15FT ADLT (MISCELLANEOUS) ×2 IMPLANT
GLOVE BIO SURGEON STRL SZ 6.5 (GLOVE) IMPLANT
GLOVE BIO SURGEON STRL SZ8 (GLOVE) ×2 IMPLANT
GLOVE BIOGEL PI IND STRL 6.5 (GLOVE) IMPLANT
GLOVE BIOGEL PI IND STRL 7.0 (GLOVE) IMPLANT
GLOVE BIOGEL PI IND STRL 8 (GLOVE) ×2 IMPLANT
GOWN STRL REUS W/ TWL LRG LVL3 (GOWN DISPOSABLE) ×2 IMPLANT
HOLDER FOLEY CATH W/STRAP (MISCELLANEOUS) IMPLANT
IMMOBILIZER KNEE 20 (SOFTGOODS) ×1
IMMOBILIZER KNEE 20 THIGH 36 (SOFTGOODS) ×2 IMPLANT
KIT TURNOVER KIT A (KITS) IMPLANT
MANIFOLD NEPTUNE II (INSTRUMENTS) ×2 IMPLANT
NS IRRIG 1000ML POUR BTL (IV SOLUTION) ×2 IMPLANT
PACK TOTAL KNEE CUSTOM (KITS) ×2 IMPLANT
PADDING CAST COTTON 6X4 STRL (CAST SUPPLIES) ×4 IMPLANT
PATELLA MEDIAL ATTUN 35MM KNEE (Knees) IMPLANT
PIN STEINMAN FIXATION KNEE (PIN) IMPLANT
PROTECTOR NERVE ULNAR (MISCELLANEOUS) ×2 IMPLANT
SET HNDPC FAN SPRY TIP SCT (DISPOSABLE) ×2 IMPLANT
SUT MNCRL AB 4-0 PS2 18 (SUTURE) ×2 IMPLANT
SUT STRATAFIX 0 PDS 27 VIOLET (SUTURE) ×1
SUT VIC AB 2-0 CT1 TAPERPNT 27 (SUTURE) ×6 IMPLANT
SUTURE STRATFX 0 PDS 27 VIOLET (SUTURE) ×2 IMPLANT
TIBIAL BASE ROT PLAT SZ 5 KNEE (Knees) ×1 IMPLANT
TRAY FOLEY MTR SLVR 16FR STAT (SET/KITS/TRAYS/PACK) IMPLANT
TUBE SUCTION HIGH CAP CLEAR NV (SUCTIONS) ×2 IMPLANT
WATER STERILE IRR 1000ML POUR (IV SOLUTION) ×4 IMPLANT
WRAP KNEE MAXI GEL POST OP (GAUZE/BANDAGES/DRESSINGS) ×2 IMPLANT

## 2023-02-28 NOTE — Progress Notes (Signed)
 Orthopedic Tech Progress Note Patient Details:  Renee Young 1945/04/24 [id number] CPM was removed  CPM Right Knee CPM Right Knee: On Right Knee Flexion (Degrees): 40 Right Knee Extension (Degrees): 10 Additional Comments: Patient stopped motion for a break d/t pain  Post Interventions Patient Tolerated: Well  Renee Young [name] 02/28/2023, 4:19 PM

## 2023-02-28 NOTE — Transfer of Care (Signed)
 Immediate Anesthesia Transfer of Care Note  Patient: Renee Young  Procedure(s) Performed: TOTAL KNEE ARTHROPLASTY (Right: Knee)  Patient Location: PACU  Anesthesia Type:MAC, Regional, and Spinal  Level of Consciousness: awake, alert , and oriented  Airway & Oxygen Therapy: Patient Spontanous Breathing and Patient connected to face mask oxygen  Post-op Assessment: Report given to RN and Post -op Vital signs reviewed and stable  Post vital signs: Reviewed and stable  Last Vitals:  Vitals Value Taken Time  BP 93/46 02/28/23 1055  Temp    Pulse 77 02/28/23 1056  Resp 14 02/28/23 1056  SpO2 98 % 02/28/23 1056  Vitals shown include unfiled device data.  Last Pain:  Vitals:   02/28/23 0822  TempSrc:   PainSc: 0-No pain         Complications: No notable events documented.

## 2023-02-28 NOTE — Anesthesia Procedure Notes (Signed)
 Spinal  Patient location during procedure: OR Start time: 02/28/2023 9:35 AM End time: 02/28/2023 9:38 AM Reason for block: surgical anesthesia Staffing Performed: anesthesiologist  Anesthesiologist: Peggye Delon Brunswick, MD Performed by: Peggye Delon Brunswick, MD Authorized by: Peggye Delon Brunswick, MD   Preanesthetic Checklist Completed: patient identified, IV checked, site marked, risks and benefits discussed, surgical consent, monitors and equipment checked, pre-op evaluation and timeout performed Spinal Block Patient position: sitting Prep: DuraPrep Patient monitoring: blood pressure and continuous pulse ox Approach: midline Location: L3-4 Injection technique: single-shot Needle Needle type: Pencan  Needle gauge: 24 G Needle length: 9 cm Additional Notes Risks and benefits of neuraxial anesthesia including, but not limited to, infection, bleeding, local anesthetic toxicity, headache, hypotension, back pain, block failure, etc. were discussed with the patient. The patient expressed understanding and consented to the procedure. I confirmed that the patient has no bleeding disorders and is not taking blood thinners. I confirmed the patient's last platelet count with the nurse. Monitors were applied. A time-out was performed immediately prior to the procedure. Sterile technique was used throughout the whole procedure.   1 attempt(s)

## 2023-02-28 NOTE — Anesthesia Procedure Notes (Signed)
 Anesthesia Regional Block: Adductor canal block   Pre-Anesthetic Checklist: , timeout performed,  Correct Patient, Correct Site, Correct Laterality,  Correct Procedure, Correct Position, site marked,  Risks and benefits discussed,  Surgical consent,  Pre-op evaluation,  At surgeon's request and post-op pain management  Laterality: Right  Prep: chloraprep       Needles:  Injection technique: Single-shot  Needle Type: Echogenic Stimulator Needle     Needle Length: 9cm  Needle Gauge: 21     Additional Needles:   Procedures:,,,, ultrasound used (permanent image in chart),,    Narrative:  Start time: 02/28/2023 8:18 AM End time: 02/28/2023 8:21 AM Injection made incrementally with aspirations every 5 mL.  Performed by: Personally  Anesthesiologist: Peggye Delon Brunswick, MD  Additional Notes: Discussed risks and benefits of nerve block including, but not limited to, prolonged and/or permanent nerve injury involving sensory and/or motor function. Monitors were applied and a time-out was performed. The nerve and associated structures were visualized under ultrasound guidance. After negative aspiration, local anesthetic was slowly injected around the nerve. There was no evidence of high pressure during the procedure. There were no paresthesias. VSS remained stable and the patient tolerated the procedure well.

## 2023-02-28 NOTE — Anesthesia Postprocedure Evaluation (Signed)
 Anesthesia Post Note  Patient: EMANUEL CAMPOS  Procedure(s) Performed: TOTAL KNEE ARTHROPLASTY (Right: Knee)     Patient location during evaluation: PACU Anesthesia Type: MAC and Spinal Level of consciousness: awake Pain management: pain level controlled Vital Signs Assessment: post-procedure vital signs reviewed and stable Respiratory status: spontaneous breathing, respiratory function stable and nonlabored ventilation Cardiovascular status: blood pressure returned to baseline and stable Postop Assessment: no headache, no backache and no apparent nausea or vomiting Anesthetic complications: no   No notable events documented.  Last Vitals:  Vitals:   02/28/23 1244 02/28/23 1449  BP: (!) 131/56 119/73  Pulse: 73 87  Resp: 16 16  Temp: (!) 36.4 C 36.7 C  SpO2: 100% 100%    Last Pain:  Vitals:   02/28/23 1527  TempSrc:   PainSc: 4                  Delon Aisha Arch

## 2023-02-28 NOTE — Discharge Instructions (Signed)
 Renee Gross, MD Total Joint Specialist [facility] [address], [address] [place], [place] [address] [phone]  TOTAL KNEE REPLACEMENT POSTOPERATIVE DIRECTIONS    Knee Rehabilitation, Guidelines Following Surgery  Results after knee surgery are often greatly improved when you follow the exercise, range of motion and muscle strengthening exercises prescribed by your doctor. Safety measures are also important to protect the knee from further injury. If any of these exercises cause you to have increased pain or swelling in your knee joint, decrease the amount until you are comfortable again and slowly increase them. If you have problems or questions, call your caregiver or physical therapist for advice.   BLOOD CLOT PREVENTION Take 81 mg Aspirin two times a day for three weeks following surgery. Then take an 81 mg Aspirin once a day for three weeks. Then discontinue Aspirin. You may resume your vitamins/supplements upon discharge from the hospital. Do not take any NSAIDs (Advil, Aleve, Ibuprofen, Meloxicam, etc.) for 3 weeks, while taking 81mg  Aspirin twice a day.   HOME CARE INSTRUCTIONS  Remove items at home which could result in a fall. This includes throw rugs or furniture in walking pathways.  ICE to the affected knee as much as tolerated. Icing helps control swelling. If the swelling is well controlled you will be more comfortable and rehab easier. Continue to use ice on the knee for pain and swelling from surgery. You may notice swelling that will progress down to the foot and ankle. This is normal after surgery. Elevate the leg when you are not up walking on it.    Continue to use the breathing machine which will help keep your temperature down. It is common for your temperature to cycle up and down following surgery, especially at night when you are not up moving around and exerting yourself. The breathing machine keeps your lungs expanded and your temperature  down. Do not place pillow under the operative knee, focus on keeping the knee straight while resting  DIET You may resume your previous home diet once you are discharged from the hospital.  DRESSING / WOUND CARE / SHOWERING Keep your bulky bandage on for 2 days. On the third post-operative day you may remove the Ace bandage and gauze. There is a waterproof adhesive bandage on your skin which will stay in place until your first follow-up appointment. Once you remove this you will not need to place another bandage You may begin showering 3 days following surgery, but do not submerge the incision under water.  ACTIVITY For the first 5 days, the key is rest and control of pain and swelling Do your home exercises twice a day starting on post-operative day 3. On the days you go to physical therapy, just do the home exercises once that day. You should rest, ice and elevate the leg for 50 minutes out of every hour. Get up and walk/stretch for 10 minutes per hour. After 5 days you can increase your activity slowly as tolerated. Walk with your walker as instructed. Use the walker until you are comfortable transitioning to a cane. Walk with the cane in the opposite hand of the operative leg. You may discontinue the cane once you are comfortable and walking steadily. Avoid periods of inactivity such as sitting longer than an hour when not asleep. This helps prevent blood clots.  You may discontinue the knee immobilizer once you are able to perform a straight leg raise while lying down. You may resume a sexual relationship in  one month or when given the OK by your doctor.  You may return to work once you are cleared by your doctor.  Do not drive a car for 6 weeks or until released by your surgeon.  Do not drive while taking narcotics.  TED HOSE STOCKINGS Wear the elastic stockings on both legs for three weeks following surgery during the day. You may remove them at night for sleeping.  WEIGHT  BEARING Weight bearing as tolerated with assist device (walker, cane, etc) as directed, use it as long as suggested by your surgeon or therapist, typically at least 4-6 weeks.  POSTOPERATIVE CONSTIPATION PROTOCOL Constipation - defined medically as fewer than three stools per week and severe constipation as less than one stool per week.  One of the most common issues patients have following surgery is constipation.  Even if you have a regular bowel pattern at home, your normal regimen is likely to be disrupted due to multiple reasons following surgery.  Combination of anesthesia, postoperative narcotics, change in appetite and fluid intake all can affect your bowels.  In order to avoid complications following surgery, here are some recommendations in order to help you during your recovery period.  Colace (docusate) - Pick up an over-the-counter form of Colace or another stool softener and take twice a day as long as you are requiring postoperative pain medications.  Take with a full glass of water daily.  If you experience loose stools or diarrhea, hold the colace until you stool forms back up. If your symptoms do not get better within 1 week or if they get worse, check with your doctor. Dulcolax (bisacodyl) - Pick up over-the-counter and take as directed by the product packaging as needed to assist with the movement of your bowels.  Take with a full glass of water.  Use this product as needed if not relieved by Colace only.  MiraLax (polyethylene glycol) - Pick up over-the-counter to have on hand. MiraLax is a solution that will increase the amount of water in your bowels to assist with bowel movements.  Take as directed and can mix with a glass of water, juice, soda, coffee, or tea. Take if you go more than two days without a movement. Do not use MiraLax more than once per day. Call your doctor if you are still constipated or irregular after using this medication for 7 days in a row.  If you continue  to have problems with postoperative constipation, please contact the office for further assistance and recommendations.  If you experience "the worst abdominal pain ever" or develop nausea or vomiting, please contact the office immediatly for further recommendations for treatment.  ITCHING If you experience itching with your medications, try taking only a single pain pill, or even half a pain pill at a time.  You can also use Benadryl over the counter for itching or also to help with sleep.   MEDICATIONS See your medication summary on the "After Visit Summary" that the nursing staff will review with you prior to discharge.  You may have some home medications which will be placed on hold until you complete the course of blood thinner medication.  It is important for you to complete the blood thinner medication as prescribed by your surgeon.  Continue your approved medications as instructed at time of discharge.  PRECAUTIONS If you experience chest pain or shortness of breath - call 911 immediately for transfer to the hospital emergency department.  If you develop a fever greater that  101 F, purulent drainage from wound, increased redness or drainage from wound, foul odor from the wound/dressing, or calf pain - CONTACT YOUR SURGEON.                                                   FOLLOW-UP APPOINTMENTS Make sure you keep all of your appointments after your operation with your surgeon and caregivers. You should call the office at the above phone number and make an appointment for approximately two weeks after the date of your surgery or on the date instructed by your surgeon outlined in the "After Visit Summary".  RANGE OF MOTION AND STRENGTHENING EXERCISES  Rehabilitation of the knee is important following a knee injury or an operation. After just a few days of immobilization, the muscles of the thigh which control the knee become weakened and shrink (atrophy). Knee exercises are designed to build up  the tone and strength of the thigh muscles and to improve knee motion. Often times heat used for twenty to thirty minutes before working out will loosen up your tissues and help with improving the range of motion but do not use heat for the first two weeks following surgery. These exercises can be done on a training (exercise) mat, on the floor, on a table or on a bed. Use what ever works the best and is most comfortable for you Knee exercises include:  Leg Lifts - While your knee is still immobilized in a splint or cast, you can do straight leg raises. Lift the leg to 60 degrees, hold for 3 sec, and slowly lower the leg. Repeat 10-20 times 2-3 times daily. Perform this exercise against resistance later as your knee gets better.  Quad and Hamstring Sets - Tighten up the muscle on the front of the thigh (Quad) and hold for 5-10 sec. Repeat this 10-20 times hourly. Hamstring sets are done by pushing the foot backward against an object and holding for 5-10 sec. Repeat as with quad sets.  Leg Slides: Lying on your back, slowly slide your foot toward your buttocks, bending your knee up off the floor (only go as far as is comfortable). Then slowly slide your foot back down until your leg is flat on the floor again. Angel Wings: Lying on your back spread your legs to the side as far apart as you can without causing discomfort.  A rehabilitation program following serious knee injuries can speed recovery and prevent re-injury in the future due to weakened muscles. Contact your doctor or a physical therapist for more information on knee rehabilitation.   POST-OPERATIVE OPIOID TAPER INSTRUCTIONS: It is important to wean off of your opioid medication as soon as possible. If you do not need pain medication after your surgery it is ok to stop day one. Opioids include: Codeine, Hydrocodone(Norco, Vicodin), Oxycodone(Percocet, oxycontin) and hydromorphone amongst others.  Long term and even short term use of opiods can  cause: Increased pain response Dependence Constipation Depression Respiratory depression And more.  Withdrawal symptoms can include Flu like symptoms Nausea, vomiting And more Techniques to manage these symptoms Hydrate well Eat regular healthy meals Stay active Use relaxation techniques(deep breathing, meditating, yoga) Do Not substitute Alcohol to help with tapering If you have been on opioids for less than two weeks and do not have pain than it is ok to stop all together.  Plan to wean off of opioids This plan should start within one week post op of your joint replacement. Maintain the same interval or time between taking each dose and first decrease the dose.  Cut the total daily intake of opioids by one tablet each day Next start to increase the time between doses. The last dose that should be eliminated is the evening dose.   IF YOU ARE TRANSFERRED TO A SKILLED REHAB FACILITY If the patient is transferred to a skilled rehab facility following release from the hospital, a list of the current medications will be sent to the facility for the patient to continue.  When discharged from the skilled rehab facility, please have the facility set up the patient's Home Health Physical Therapy prior to being released. Also, the skilled facility will be responsible for providing the patient with their medications at time of release from the facility to include their pain medication, the muscle relaxants, and their blood thinner medication. If the patient is still at the rehab facility at time of the two week follow up appointment, the skilled rehab facility will also need to assist the patient in arranging follow up appointment in our office and any transportation needs.  MAKE SURE YOU:  Understand these instructions.  Get help right away if you are not doing well or get worse.   DENTAL ANTIBIOTICS:  In most cases prophylactic antibiotics for Dental procdeures after total joint surgery are  not necessary.  Exceptions are as follows:  1. History of prior total joint infection  2. Severely immunocompromised (Organ Transplant, cancer chemotherapy, Rheumatoid biologic meds such as Humera)  3. Poorly controlled diabetes (A1C &gt; 8.0, blood glucose over 200)  If you have one of these conditions, contact your surgeon for an antibiotic prescription, prior to your dental procedure.    Pick up stool softner and laxative for home use following surgery while on pain medications. Do not submerge incision under water. Please use good hand washing techniques while changing dressing each day. May shower starting three days after surgery. Please use a clean towel to pat the incision dry following showers. Continue to use ice for pain and swelling after surgery. Do not use any lotions or creams on the incision until instructed by your surgeon.

## 2023-02-28 NOTE — Care Plan (Signed)
 Ortho Bundle Case Management Note  Patient Details  Name: Renee Young MRN: [id number] Date of Birth: [birth year]                  R TKA on 02-28-23  DCP: Home with husband  DME: No needs, has a RW  PT: [facility] () on 03-03-23   DME Arranged:  N/A DME Agency:       Additional Comments: Please contact me with any questions of if this plan should need to change.   Kate DELENA Kraft, RN,CCM [facility]  [phone] 02/28/2023, 9:47 AM

## 2023-02-28 NOTE — Evaluation (Addendum)
 Physical Therapy Evaluation Patient Details Name: Renee Young MRN: [id number] DOB: 1945/10/14 Today's Date: 02/28/2023  History of Present Illness  78 yo s/p R TKA on 02/28/23. PMH: L TKA, MDD, DM  Clinical Impression  Pt is s/p TKA resulting in the deficits listed below (see PT Problem List).  Pt agreeable to PT, amb  25' with RW and min assist, anticipate steady progress.  Pt will benefit from acute skilled PT to increase their independence and safety with mobility to allow discharge.          If plan is discharge home, recommend the following: A little help with walking and/or transfers;A little help with bathing/dressing/bathroom;Help with stairs or ramp for entrance;Assist for transportation;Assistance with cooking/housework   Can travel by private vehicle        Equipment Recommendations None recommended by PT  Recommendations for Other Services       Functional Status Assessment Patient has had a recent decline in their functional status and demonstrates the ability to make significant improvements in function in a reasonable and predictable amount of time.     Precautions / Restrictions Precautions Precautions: Fall;Knee Required Braces or Orthoses: Knee Immobilizer - Right Knee Immobilizer - Right: Discontinue once straight leg raise with < 10 degree lag Restrictions Weight Bearing Restrictions Per Provider Order: No Other Position/Activity Restrictions: WBAT      Mobility  Bed Mobility Overal bed mobility: Needs Assistance Bed Mobility: Supine to Sit     Supine to sit: Contact guard     General bed mobility comments: for safety    Transfers Overall transfer level: Needs assistance Equipment used: Rolling walker (2 wheels) Transfers: Sit to/from Stand Sit to Stand: Min assist, Contact guard assist           General transfer comment: cues for hand placement    Ambulation/Gait Ambulation/Gait assistance: Contact guard assist, Min assist Gait  Distance (Feet): 25 Feet Assistive device: Rolling walker (2 wheels) Gait Pattern/deviations: Step-to pattern, Decreased stance time - right       General Gait Details: cues for sequence initially, step to pattern for pain control  Stairs            Wheelchair Mobility     Tilt Bed    Modified Rankin (Stroke Patients Only)       Balance Overall balance assessment: Needs assistance Sitting-balance support: No upper extremity supported, Feet supported Sitting balance-Leahy Scale: Good     Standing balance support: During functional activity, Reliant on assistive device for balance Standing balance-Leahy Scale: Poor                               Pertinent Vitals/Pain Pain Assessment Pain Assessment: 0-10 Pain Score: 5  Pain Location: right knee Pain Descriptors / Indicators: Aching, Grimacing, Sore Pain Intervention(s): Limited activity within patient's tolerance, Monitored during session, Premedicated before session, Repositioned    Home Living Family/patient expects to be discharged to:: Private residence Living Arrangements: Spouse/significant other Available Help at Discharge: Family Type of Home: House Home Access: Stairs to enter Entrance Stairs-Rails: Doctor, General Practice of Steps: 3   Home Layout: One level Home Equipment: Agricultural Consultant (2 wheels);Cane - single point;Tub bench;BSC/3in1      Prior Function Prior Level of Function : Independent/Modified Independent                     Extremity/Trunk Assessment   Upper Extremity Assessment Upper  Extremity Assessment: Overall WFL for tasks assessed    Lower Extremity Assessment Lower Extremity Assessment: RLE deficits/detail RLE Deficits / Details: ankle WFL, knee extension and hip flexion 3/5       Communication   Communication Communication: No apparent difficulties  Cognition Arousal: Alert Behavior During Therapy: WFL for tasks  assessed/performed Overall Cognitive Status: Within Functional Limits for tasks assessed                                          General Comments      Exercises Total Joint Exercises Ankle Circles/Pumps: AROM, Both, 10 reps Quad Sets: AROM, Both, 5 reps   Assessment/Plan    PT Assessment Patient needs continued PT services  PT Problem List Decreased strength;Decreased range of motion;Decreased activity tolerance;Decreased mobility;Decreased knowledge of precautions;Decreased knowledge of use of DME;Pain       PT Treatment Interventions DME instruction;Gait training;Functional mobility training;Therapeutic activities;Therapeutic exercise;Patient/family education;Stair training    PT Goals (Current goals can be found in the Care Plan section)  Acute Rehab PT Goals Patient Stated Goal: less pain PT Goal Formulation: With patient Time For Goal Achievement: 03/07/23 Potential to Achieve Goals: Good    Frequency 7X/week     Co-evaluation               AM-PAC PT 6 Clicks Mobility  Outcome Measure Help needed turning from your back to your side while in a flat bed without using bedrails?: A Little Help needed moving from lying on your back to sitting on the side of a flat bed without using bedrails?: A Little Help needed moving to and from a bed to a chair (including a wheelchair)?: A Little Help needed standing up from a chair using your arms (e.g., wheelchair or bedside chair)?: A Little Help needed to walk in hospital room?: A Little Help needed climbing 3-5 steps with a railing? : A Little 6 Click Score: 18    End of Session Equipment Utilized During Treatment: Gait belt Activity Tolerance: Patient tolerated treatment well Patient left: in chair;with call bell/phone within reach;with chair alarm set;with family/visitor present Nurse Communication: Mobility status PT Visit Diagnosis: Other abnormalities of gait and mobility (R26.89);Difficulty in  walking, not elsewhere classified (R26.2)    Time: 8399-8376 PT Time Calculation (min) (ACUTE ONLY): 21 min   Charges:   PT Evaluation $PT Eval Low Complexity: 1 Low  PT General Charges $$ ACUTE PT VISIT: 1 Visit         Levy Cedano, PT  Acute Rehab Dept [facility]) [phone]  02/28/2023   [facility] 02/28/2023, 4:52 PM

## 2023-02-28 NOTE — Progress Notes (Signed)
 Orthopedic Tech Progress Note Patient Details:  Renee Young Jan 16, 1946 [id number] CPM will be removed at 3:30pm.  CPM Right Knee CPM Right Knee: On Right Knee Flexion (Degrees): 40 Right Knee Extension (Degrees): 10  Post Interventions Patient Tolerated: Well  [name] [name] 02/28/2023, 11:31 AM

## 2023-02-28 NOTE — Op Note (Signed)
 OPERATIVE REPORT-TOTAL KNEE ARTHROPLASTY   Pre-operative diagnosis- Osteoarthritis  Right knee(s)  Post-operative diagnosis- Osteoarthritis Right knee(s)  Procedure-  Right  Total Knee Arthroplasty  Surgeon- Dempsey GAILS. Hyatt Capobianco, MD  Assistant- Corean Sender, PA-C   Anesthesia-   Adductor canal block and spinal  EBL- 25 ml   Drains None \ Tourniquet{ 30 minutes @ 300 mm Hg  Complications- None  Condition-PACU - hemodynamically stable.   Brief Clinical Note  Renee Young is a 78 y.o. year old female with end stage OA of her right knee with progressively worsening pain and dysfunction. She has constant pain, with activity and at rest and significant functional deficits with difficulties even with ADLs. She has had extensive non-op management including analgesics, injections of cortisone and viscosupplements, and home exercise program, but remains in significant pain with significant dysfunction.Radiographs show bone on bone arthritis medial and patellofemoral. She presents now for right Total Knee Arthroplasty.     Procedure in detail---   The patient is brought into the operating room and positioned supine on the operating table. After successful administration of  Adductor canal block and spinal,   a tourniquet is placed high on the  Right thigh(s) and the lower extremity is prepped and draped in the usual sterile fashion. Time out is performed by the operating team and then the  Right lower extremity is wrapped in Esmarch, knee flexed and the tourniquet inflated to 300 mmHg.       A midline incision is made with a ten blade through the subcutaneous tissue to the level of the extensor mechanism. A fresh blade is used to make a medial parapatellar arthrotomy. Soft tissue over the proximal medial tibia is subperiosteally elevated to the joint line with a knife and into the semimembranosus bursa with a Cobb elevator. Soft tissue over the proximal lateral tibia is elevated with  attention being paid to avoiding the patellar tendon on the tibial tubercle. The patella is everted, knee flexed 90 degrees and the ACL and PCL are removed. Findings are bone on bone medial and patellofemoral with large global osteophytes        The drill is used to create a starting hole in the distal femur and the canal is thoroughly irrigated with sterile saline to remove the fatty contents. The 5 degree Right  valgus alignment guide is placed into the femoral canal and the distal femoral cutting block is pinned to remove 10 mm off the distal femur. Resection is made with an oscillating saw.      The tibia is subluxed forward and the menisci are removed. The extramedullary alignment guide is placed referencing proximally at the medial aspect of the tibial tubercle and distally along the second metatarsal axis and tibial crest. The block is pinned to remove 2mm off the more deficient medial  side. Resection is made with an oscillating saw. Size 5is the most appropriate size for the tibia and the proximal tibia is prepared with the modular drill and keel punch for that size.      The femoral sizing guide is placed and size 5 is most appropriate. Rotation is marked off the epicondylar axis and confirmed by creating a rectangular flexion gap at 90 degrees. The size 5 cutting block is pinned in this rotation and the anterior, posterior and chamfer cuts are made with the oscillating saw. The intercondylar block is then placed and that cut is made.      Trial size 5 tibial component, trial size 5  narrow posterior stabilized femur and a 8  mm posterior stabilized rotating platform insert trial is placed. Full extension is achieved with excellent varus/valgus and anterior/posterior balance throughout full range of motion. The patella is everted and thickness measured to be 22  mm. Free hand resection is taken to 12 mm, a 35 template is placed, lug holes are drilled, trial patella is placed, and it tracks normally.  Osteophytes are removed off the posterior femur with the trial in place. All trials are removed and the cut bone surfaces prepared with pulsatile lavage. Cement is mixed and once ready for implantation, the size 5 tibial implant, size  5 narrow posterior stabilized femoral component, and the size 35 patella are cemented in place and the patella is held with the clamp. The trial insert is placed and the knee held in full extension. The Exparel  (20 ml mixed with 60 ml saline) is injected into the extensor mechanism, posterior capsule, medial and lateral gutters and subcutaneous tissues.  All extruded cement is removed and once the cement is hard the permanent 8 mm posterior stabilized rotating platform insert is placed into the tibial tray.      The wound is copiously irrigated with saline solution and the extensor mechanism closed with # 0 Stratofix suture. The tourniquet is released for a total tourniquet time of 30  minutes. Flexion against gravity is 140 degrees and the patella tracks normally. Subcutaneous tissue is closed with 2.0 vicryl and subcuticular with running 4.0 Monocryl. The incision is cleaned and dried and steri-strips and a bulky sterile dressing are applied. The limb is placed into a knee immobilizer and the patient is awakened and transported to recovery in stable condition.      Please note that a surgical assistant was a medical necessity for this procedure in order to perform it in a safe and expeditious manner. Surgical assistant was necessary to retract the ligaments and vital neurovascular structures to prevent injury to them and also necessary for proper positioning of the limb to allow for anatomic placement of the prosthesis.   Dempsey ROCKFORD Maritta Kief, MD    02/28/2023, 10:32 AM

## 2023-02-28 NOTE — Interval H&P Note (Signed)
 History and Physical Interval Note:  02/28/2023 7:14 AM  Renee Young  has presented today for surgery, with the diagnosis of right knee osteoarthritis.  The various methods of treatment have been discussed with the patient and family. After consideration of risks, benefits and other options for treatment, the patient has consented to  Procedure(s): TOTAL KNEE ARTHROPLASTY (Right) as a surgical intervention.  The patient's history has been reviewed, patient examined, no change in status, stable for surgery.  I have reviewed the patient's chart and labs.  Questions were answered to the patient's satisfaction.     Dempsey Shahir Karen

## 2023-03-01 ENCOUNTER — Other Ambulatory Visit (HOSPITAL_COMMUNITY): Payer: Self-pay

## 2023-03-01 ENCOUNTER — Encounter (HOSPITAL_COMMUNITY): Payer: Self-pay | Admitting: Orthopedic Surgery

## 2023-03-01 DIAGNOSIS — E119 Type 2 diabetes mellitus without complications: Secondary | ICD-10-CM | POA: Diagnosis not present

## 2023-03-01 DIAGNOSIS — Z7984 Long term (current) use of oral hypoglycemic drugs: Secondary | ICD-10-CM | POA: Diagnosis not present

## 2023-03-01 DIAGNOSIS — M1711 Unilateral primary osteoarthritis, right knee: Secondary | ICD-10-CM | POA: Diagnosis not present

## 2023-03-01 DIAGNOSIS — E782 Mixed hyperlipidemia: Secondary | ICD-10-CM | POA: Diagnosis not present

## 2023-03-01 DIAGNOSIS — Z79899 Other long term (current) drug therapy: Secondary | ICD-10-CM | POA: Diagnosis not present

## 2023-03-01 DIAGNOSIS — F909 Attention-deficit hyperactivity disorder, unspecified type: Secondary | ICD-10-CM | POA: Diagnosis not present

## 2023-03-01 DIAGNOSIS — Z860100 Personal history of colon polyps, unspecified: Secondary | ICD-10-CM | POA: Diagnosis not present

## 2023-03-01 DIAGNOSIS — Z96652 Presence of left artificial knee joint: Secondary | ICD-10-CM | POA: Diagnosis not present

## 2023-03-01 DIAGNOSIS — K219 Gastro-esophageal reflux disease without esophagitis: Secondary | ICD-10-CM | POA: Diagnosis not present

## 2023-03-01 LAB — CBC
HCT: 33.3 % — ABNORMAL LOW (ref 36.0–46.0)
Hemoglobin: 10.5 g/dL — ABNORMAL LOW (ref 12.0–15.0)
MCH: 25.9 pg — ABNORMAL LOW (ref 26.0–34.0)
MCHC: 31.5 g/dL (ref 30.0–36.0)
MCV: 82 fL (ref 80.0–100.0)
Platelets: 271 10*3/uL (ref 150–400)
RBC: 4.06 MIL/uL (ref 3.87–5.11)
RDW: 14.6 % (ref 11.5–15.5)
WBC: 11.3 10*3/uL — ABNORMAL HIGH (ref 4.0–10.5)
nRBC: 0 % (ref 0.0–0.2)

## 2023-03-01 LAB — GLUCOSE, CAPILLARY
Glucose-Capillary: 154 mg/dL — ABNORMAL HIGH (ref 70–99)
Glucose-Capillary: 125 mg/dL — ABNORMAL HIGH (ref 70–99)
Glucose-Capillary: 110 mg/dL — ABNORMAL HIGH (ref 70–99)
Glucose-Capillary: 188 mg/dL — ABNORMAL HIGH (ref 70–99)

## 2023-03-01 LAB — BASIC METABOLIC PANEL
Anion gap: 7 (ref 5–15)
BUN: 21 mg/dL (ref 8–23)
CO2: 23 mmol/L (ref 22–32)
Calcium: 9.2 mg/dL (ref 8.9–10.3)
Chloride: 104 mmol/L (ref 98–111)
Creatinine, Ser: 0.75 mg/dL (ref 0.44–1.00)
GFR, Estimated: >60 mL/min (ref >60)
Glucose, Bld: 172 mg/dL — ABNORMAL HIGH (ref 70–99)
Potassium: 3.7 mmol/L (ref 3.5–5.1)
Sodium: 134 mmol/L — ABNORMAL LOW (ref 135–145)

## 2023-03-01 MED ORDER — CYCLOBENZAPRINE HCL 5 MG PO TABS: 5.0000 mg | ORAL_TABLET | Freq: Three times a day (TID) | ORAL | 0 refills | Status: DC | PRN

## 2023-03-01 MED ORDER — OXYCODONE HCL 5 MG PO TABS: 5.0000 mg | ORAL_TABLET | Freq: Four times a day (QID) | ORAL | 0 refills | Status: DC | PRN

## 2023-03-01 MED ORDER — ONDANSETRON HCL 4 MG PO TABS: 4.0000 mg | ORAL_TABLET | Freq: Four times a day (QID) | ORAL | 0 refills | Status: DC | PRN

## 2023-03-01 MED ORDER — ASPIRIN 81 MG PO CHEW: 81.0000 mg | CHEWABLE_TABLET | Freq: Two times a day (BID) | ORAL | 0 refills | Status: AC

## 2023-03-01 NOTE — Progress Notes (Signed)
 Subjective: 1 Day Post-Op Procedure(s) (LRB): TOTAL KNEE ARTHROPLASTY (Right) Patient seen in rounds by Dr. Melodi. Patient is well, and has had no acute complaints or problems. Denies SOB or chest pain. Denies calf pain. Foley cath removed this AM. Patient reports pain as moderate. Worked with physical therapy yesterday and ambulated 25'. We will continue physical therapy today.  Objective: Vital signs in last 24 hours: Temp:  [97 F (36.1 C)-98.4 F (36.9 C)] 98.1 F (36.7 C) (01/14 0528) Pulse Rate:  [66-93] 85 (01/14 0528) Resp:  [10-21] 17 (01/14 0528) BP: (92-150)/(46-73) 116/54 (01/14 0528) SpO2:  [92 %-100 %] 94 % (01/14 0528) Weight:  [75.8 kg] 75.8 kg (01/13 0750)  Intake/Output from previous day:  Intake/Output Summary (Last 24 hours) at 03/01/2023 0738 Last data filed at 03/01/2023 0528 Gross per 24 hour  Intake 1865.62 ml  Output 1545 ml  Net 320.62 ml     Intake/Output this shift: No intake/output data recorded.  Labs: Recent Labs    03/01/23 0340  HGB 10.5*   Recent Labs    03/01/23 0340  WBC 11.3*  RBC 4.06  HCT 33.3*  PLT 271   Recent Labs    03/01/23 0340  NA 134*  K 3.7  CL 104  CO2 23  BUN 21  CREATININE 0.75  GLUCOSE 172*  CALCIUM  9.2   No results for input(s): LABPT, INR in the last 72 hours.  Exam: General - Patient is Alert and Oriented Extremity - Neurologically intact Neurovascular intact Sensation intact distally Dorsiflexion/Plantar flexion intact Dressing - dressing C/D/I Motor Function - intact, moving foot and toes well on exam.  Past Medical History:  Diagnosis Date   ADD (attention deficit disorder)    Allergy    Anemia    Arthritis    Bilateral temporomandibular joint pain 05/30/2019   Chicken pox    Colon polyps    Depression    Diabetes mellitus without complication (HCC)    GERD (gastroesophageal reflux disease)    History of hiatal hernia    History of kidney stones    Phlebitis    UTI  (urinary tract infection)    UTI (urinary tract infection)    Varicose veins of bilateral lower extremities with other complications 07/03/2012    Assessment/Plan: 1 Day Post-Op Procedure(s) (LRB): TOTAL KNEE ARTHROPLASTY (Right) Principal Problem:   OA (osteoarthritis) of knee Active Problems:   Primary osteoarthritis of right knee  Estimated body mass index is 28.67 kg/m as calculated from the following:   Height as of this encounter: 5' 4 (1.626 m).   Weight as of this encounter: 75.8 kg. Advance diet Up with therapy D/C IV fluids   Patient's anticipated LOS is less than 2 midnights, meeting these requirements: - Lives within 1 hour of care - Has a competent adult at home to recover with post-op - NO history of  - Chronic pain requiring opiods  - Diabetes  - Coronary Artery Disease  - Heart failure  - Heart attack  - Stroke  - DVT/VTE  - Cardiac arrhythmia  - Respiratory Failure/COPD  - Renal failure  - Anemia  - Advanced Liver disease  DVT Prophylaxis - Aspirin  Weight bearing as tolerated.  Continue physical therapy. Expected discharge home today pending progress and if meeting patient goals. Scheduled for OPPT at [facility]). Follow-up in clinic in 2 weeks.  The PDMP database was reviewed today prior to any opioid medications being prescribed to this patient.  R. Zelda Kobs,  PA-C Orthopedic Surgery [phone] 03/01/2023, 7:38 AM

## 2023-03-01 NOTE — Progress Notes (Signed)
 Physical Therapy Treatment Patient Details Name: Renee Young MRN: [id number] DOB: 09/26/1945 Today's Date: 03/01/2023   History of Present Illness 78 yo s/p R TKA on 02/28/23. PMH: L TKA, MDD, DM    PT Comments  Progressing steadily, had some mild dizziness end of ambulation distance; pt is motivated to d/c home today, will see how she does pm session, hopefully will be ready to d/c after pm PT    If plan is discharge home, recommend the following: A little help with walking and/or transfers;A little help with bathing/dressing/bathroom;Help with stairs or ramp for entrance;Assist for transportation;Assistance with cooking/housework   Can travel by private vehicle        Equipment Recommendations  None recommended by PT    Recommendations for Other Services       Precautions / Restrictions Precautions Precautions: Fall;Knee Required Braces or Orthoses: Knee Immobilizer - Right Knee Immobilizer - Right: Discontinue once straight leg raise with < 10 degree lag Restrictions Weight Bearing Restrictions Per Provider Order: No Other Position/Activity Restrictions: WBAT     Mobility  Bed Mobility Overal bed mobility: Needs Assistance Bed Mobility: Supine to Sit     Supine to sit: Supervision, HOB elevated     General bed mobility comments: incr time, able to self assist with cues    Transfers Overall transfer level: Needs assistance Equipment used: Rolling walker (2 wheels) Transfers: Sit to/from Stand Sit to Stand: Contact guard assist           General transfer comment: cues for hand placement and RLE position    Ambulation/Gait Ambulation/Gait assistance: Contact guard assist Gait Distance (Feet): 50 Feet (10' more) Assistive device: Rolling walker (2 wheels) Gait Pattern/deviations: Step-to pattern, Decreased stance time - right       General Gait Details: cues for sequence initially, step to pattern for pain control,  beginning step through end of  distance. steady gait with RW, no knee buckling ot LOB   Stairs             Wheelchair Mobility     Tilt Bed    Modified Rankin (Stroke Patients Only)       Balance                                            Cognition Arousal: Alert Behavior During Therapy: WFL for tasks assessed/performed Overall Cognitive Status: Within Functional Limits for tasks assessed                                          Exercises      General Comments        Pertinent Vitals/Pain Pain Assessment Pain Assessment: 0-10 Pain Score: 6  Pain Location: right knee Pain Descriptors / Indicators: Aching, Grimacing, Sore Pain Intervention(s): Limited activity within patient's tolerance, Monitored during session, Premedicated before session, Repositioned    Home Living                          Prior Function            PT Goals (current goals can now be found in the care plan section) Acute Rehab PT Goals Patient Stated Goal: less pain PT Goal Formulation: With patient Time For Goal  Achievement: 03/07/23 Potential to Achieve Goals: Good Progress towards PT goals: Progressing toward goals    Frequency    7X/week      PT Plan      Co-evaluation              AM-PAC PT 6 Clicks Mobility   Outcome Measure  Help needed turning from your back to your side while in a flat bed without using bedrails?: A Little Help needed moving from lying on your back to sitting on the side of a flat bed without using bedrails?: A Little Help needed moving to and from a bed to a chair (including a wheelchair)?: A Little Help needed standing up from a chair using your arms (e.g., wheelchair or bedside chair)?: A Little Help needed to walk in hospital room?: A Little Help needed climbing 3-5 steps with a railing? : A Little 6 Click Score: 18    End of Session Equipment Utilized During Treatment: Gait belt Activity Tolerance: Patient  tolerated treatment well Patient left: in chair;with call bell/phone within reach;with chair alarm set   PT Visit Diagnosis: Other abnormalities of gait and mobility (R26.89);Difficulty in walking, not elsewhere classified (R26.2)     Time: 8846-8789 PT Time Calculation (min) (ACUTE ONLY): 17 min  Charges:    $Gait Training: 8-22 mins PT General Charges $$ ACUTE PT VISIT: 1 Visit                     Shaneeka Scarboro, PT  Acute Rehab Dept [facility]) [phone]  03/01/2023    [facility] 03/01/2023, 12:20 PM

## 2023-03-01 NOTE — Care Management Obs Status (Signed)
 MEDICARE OBSERVATION STATUS NOTIFICATION   Patient Details  Name: ENRIQUETA AUGUSTA MRN: [id number] Date of Birth: 02/27/1945   Medicare Observation Status Notification Given:       Howell Rucks, RN 03/01/2023, 9:40 AM

## 2023-03-01 NOTE — Plan of Care (Signed)
  Problem: Health Behavior/Discharge Planning: Goal: Ability to manage health-related needs will improve Outcome: Progressing   Problem: Clinical Measurements: Goal: Ability to maintain clinical measurements within normal limits will improve Outcome: Progressing   Problem: Activity: Goal: Risk for activity intolerance will decrease Outcome: Progressing   Problem: Coping: Goal: Level of anxiety will decrease Outcome: Progressing   Problem: Pain Management: Goal: General experience of comfort will improve Outcome: Progressing   Problem: Nutritional: Goal: Maintenance of adequate nutrition will improve Outcome: Progressing

## 2023-03-01 NOTE — Progress Notes (Signed)
 PT TX NOTE   03/01/23 1500  PT Visit Information  Last PT Received On 03/01/23   Pt limited by dizziness this pm;  with second distance of 15' pt reported feeling hot and as if she may pass out. unable to safely leave pt to get dinamap therefore unable to obtain sitting BP. after seated rest pt able to take lateral steps along EOB with CGA and then returned to supine, BP 144/61(lying), HR 71, SpO2=94% on RA;  symptoms resolved in supine. RN aware. Pt not meeting all PT goals d/t medical issues and will likely benefit from another day to allow safe d/c home   History of Present Illness 78 yo s/p R TKA on 02/28/23. PMH: L TKA, MDD, DM  Subjective Data  Patient Stated Goal less pain  Precautions  Precautions Fall;Knee  Required Braces or Orthoses Knee Immobilizer - Right  Knee Immobilizer - Right Discontinue once straight leg raise with < 10 degree lag  Restrictions  Weight Bearing Restrictions Per Provider Order No  Other Position/Activity Restrictions WBAT  Pain Assessment  Pain Assessment 0-10  Pain Score 5  Pain Location right knee  Pain Descriptors / Indicators Aching;Grimacing;Sore  Pain Intervention(s) Limited activity within patient's tolerance;Monitored during session;Premedicated before session;Repositioned;Ice applied  Cognition  Arousal Alert  Behavior During Therapy WFL for tasks assessed/performed  Overall Cognitive Status Within Functional Limits for tasks assessed  Bed Mobility  Overal bed mobility Needs Assistance  Bed Mobility Supine to Sit;Sit to Supine  Supine to sit Supervision  Sit to supine Supervision  General bed mobility comments incr time, able to self assist with gait belt  Transfers  Overall transfer level Needs assistance  Equipment used Rolling walker (2 wheels)  Transfers Sit to/from Stand  Sit to Stand Contact guard assist  General transfer comment cues for hand placement and RLE position  Ambulation/Gait  Ambulation/Gait assistance Contact guard  assist;Min assist  Gait Distance (Feet) 15 Feet (x2)  Assistive device Rolling walker (2 wheels)  Gait Pattern/deviations Step-to pattern;Decreased stance time - right  General Gait Details cues for sequence and RW position; pt dizzy on second distance of 15' feeling hot and as if she may pass out. unable to leave pt to get dinamap therefore unable to obtain sitting BP. after seated rest pt able to take lateral steps along EOB with CGA and returned to supine, BP 144/61, HR 71, SpO2=94% on RA; symptoms resolved in supine  Balance  Sitting balance-Leahy Scale Good  Standing balance support During functional activity;Reliant on assistive device for balance  Standing balance-Leahy Scale Poor  Total Joint Exercises  Ankle Circles/Pumps AROM;Both;10 reps  PT - End of Session  Equipment Utilized During Treatment Gait belt  Activity Tolerance Patient tolerated treatment well  Patient left with call bell/phone within reach;in bed;with bed alarm set   PT - Assessment/Plan  PT Visit Diagnosis Other abnormalities of gait and mobility (R26.89);Difficulty in walking, not elsewhere classified (R26.2)  PT Frequency (ACUTE ONLY) 7X/week  Follow Up Recommendations Follow physician's recommendations for discharge plan and follow up therapies  Patient can return home with the following A little help with walking and/or transfers;A little help with bathing/dressing/bathroom;Help with stairs or ramp for entrance;Assist for transportation;Assistance with cooking/housework  PT equipment None recommended by PT  AM-PAC PT 6 Clicks Mobility Outcome Measure (Version 2)  Help needed turning from your back to your side while in a flat bed without using bedrails? 3  Help needed moving from lying on your back to sitting  on the side of a flat bed without using bedrails? 3  Help needed moving to and from a bed to a chair (including a wheelchair)? 3  Help needed standing up from a chair using your arms (e.g., wheelchair  or bedside chair)? 3  Help needed to walk in hospital room? 3  Help needed climbing 3-5 steps with a railing?  3  6 Click Score 18  Consider Recommendation of Discharge To: Home with [facility]  PT Goal Progression  Progress towards PT goals Progressing toward goals  Acute Rehab PT Goals  PT Goal Formulation With patient  Time For Goal Achievement 03/07/23  Potential to Achieve Goals Good  PT Time Calculation  PT Start Time (ACUTE ONLY) 1522  PT Stop Time (ACUTE ONLY) 1548  PT Time Calculation (min) (ACUTE ONLY) 26 min  PT General Charges  $$ ACUTE PT VISIT 1 Visit  PT Treatments  $Gait Training 8-22 mins  $Therapeutic Activity 8-22 mins

## 2023-03-01 NOTE — TOC Transition Note (Signed)
 Transition of Care [facility]) - Discharge Note   Patient Details  Name: Renee Young MRN: [id number] Date of Birth: May 12, 1945  [facility]) CM/SW Contact:  Alfonse JONELLE Rex, RN Phone Number: 03/01/2023, 10:49 AM   Clinical Narrative:  Met with pt at bedside, confirmed OPPT with [facility], no home DME needs. No TOC needs.      Final next level of care: OP Rehab Barriers to Discharge: No Barriers Identified   Patient Goals and CMS Choice Patient states their goals for this hospitalization and ongoing recovery are:: return home          Discharge Placement                       Discharge Plan and Services Additional resources added to the After Visit Summary for                  DME Arranged: N/A                    Social Drivers of Health (SDOH) Interventions SDOH Screenings   Food Insecurity: No Food Insecurity (02/28/2023)  Housing: Low Risk  (02/28/2023)  Transportation Needs: No Transportation Needs (02/28/2023)  Utilities: Not At Risk (02/28/2023)  Alcohol Screen: Low Risk  (09/20/2022)  Depression (PHQ2-9): Low Risk  (02/07/2023)  Financial Resource Strain: Low Risk  (09/20/2022)  Physical Activity: Insufficiently Active (09/20/2022)  Social Connections: Moderately Integrated (02/28/2023)  Stress: Stress Concern Present (09/20/2022)  Tobacco Use: Low Risk  (02/28/2023)  Health Literacy: Adequate Health Literacy (09/20/2022)     Readmission Risk Interventions     No data to display

## 2023-03-01 NOTE — Plan of Care (Signed)
  Problem: Activity: Goal: Risk for activity intolerance will decrease Outcome: Progressing   Problem: Education: Goal: Knowledge of the prescribed therapeutic regimen will improve Outcome: Progressing   Problem: Activity: Goal: Ability to avoid complications of mobility impairment will improve Outcome: Progressing Goal: Range of joint motion will improve Outcome: Progressing

## 2023-03-02 DIAGNOSIS — Z79899 Other long term (current) drug therapy: Secondary | ICD-10-CM | POA: Diagnosis not present

## 2023-03-02 DIAGNOSIS — K219 Gastro-esophageal reflux disease without esophagitis: Secondary | ICD-10-CM | POA: Diagnosis not present

## 2023-03-02 DIAGNOSIS — M1711 Unilateral primary osteoarthritis, right knee: Secondary | ICD-10-CM | POA: Diagnosis not present

## 2023-03-02 DIAGNOSIS — Z860100 Personal history of colon polyps, unspecified: Secondary | ICD-10-CM | POA: Diagnosis not present

## 2023-03-02 DIAGNOSIS — Z96652 Presence of left artificial knee joint: Secondary | ICD-10-CM | POA: Diagnosis not present

## 2023-03-02 DIAGNOSIS — Z7984 Long term (current) use of oral hypoglycemic drugs: Secondary | ICD-10-CM | POA: Diagnosis not present

## 2023-03-02 DIAGNOSIS — E782 Mixed hyperlipidemia: Secondary | ICD-10-CM | POA: Diagnosis not present

## 2023-03-02 DIAGNOSIS — F909 Attention-deficit hyperactivity disorder, unspecified type: Secondary | ICD-10-CM | POA: Diagnosis not present

## 2023-03-02 DIAGNOSIS — E119 Type 2 diabetes mellitus without complications: Secondary | ICD-10-CM | POA: Diagnosis not present

## 2023-03-02 LAB — CBC
HCT: 32.5 % — ABNORMAL LOW (ref 36.0–46.0)
Hemoglobin: 10.3 g/dL — ABNORMAL LOW (ref 12.0–15.0)
MCH: 25.7 pg — ABNORMAL LOW (ref 26.0–34.0)
MCHC: 31.7 g/dL (ref 30.0–36.0)
MCV: 81 fL (ref 80.0–100.0)
Platelets: 264 10*3/uL (ref 150–400)
RBC: 4.01 MIL/uL (ref 3.87–5.11)
RDW: 14.5 % (ref 11.5–15.5)
WBC: 10.7 10*3/uL — ABNORMAL HIGH (ref 4.0–10.5)
nRBC: 0 % (ref 0.0–0.2)

## 2023-03-02 LAB — GLUCOSE, CAPILLARY
Glucose-Capillary: 145 mg/dL — ABNORMAL HIGH (ref 70–99)
Glucose-Capillary: 114 mg/dL — ABNORMAL HIGH (ref 70–99)

## 2023-03-02 MED ORDER — AMPHETAMINE-DEXTROAMPHETAMINE 10 MG PO TABS
20.0000 mg | ORAL_TABLET | Freq: Once | ORAL | Status: AC
  Administered 2023-03-02: 20 mg via ORAL
  Filled 2023-03-02: qty 2

## 2023-03-02 NOTE — Progress Notes (Addendum)
 Physical Therapy Treatment Patient Details Name: Renee Young MRN: [id number] DOB: May 27, 1945 Today's Date: 03/02/2023   History of Present Illness 78 yo s/p R TKA on 02/28/23. PMH: L TKA, MDD, DM    PT Comments  Pt is progressing quite well, pain improved with mobility and is meeting PT goals. No dizziness during pm session; reviewed stairs successfully. Pt feels ready to d/c home with family assisting as needed.    If plan is discharge home, recommend the following: A little help with walking and/or transfers;A little help with bathing/dressing/bathroom;Help with stairs or ramp for entrance;Assist for transportation;Assistance with cooking/housework   Can travel by private vehicle        Equipment Recommendations  None recommended by PT    Recommendations for Other Services       Precautions / Restrictions Precautions Precautions: Fall;Knee Required Braces or Orthoses: Knee Immobilizer - Right Knee Immobilizer - Right: Discontinue once straight leg raise with < 10 degree lag Restrictions Weight Bearing Restrictions Per Provider Order: No Other Position/Activity Restrictions: WBAT     Mobility  Bed Mobility Overal bed mobility: Needs Assistance Bed Mobility: Supine to Sit, Sit to Supine     Supine to sit: Min assist Sit to supine: Supervision   General bed mobility comments: incr time, able to self assist with cues and leg lifter to supine; light assist to lift RLE  off  bed    Transfers Overall transfer level: Needs assistance Equipment used: Rolling walker (2 wheels) Transfers: Sit to/from Stand Sit to Stand: Supervision, Modified independent (Device/Increase time)           General transfer comment: cues for hand placement and RLE position initially; demonstrates carryover from previous sessions    Ambulation/Gait Ambulation/Gait assistance: Supervision Gait Distance (Feet): 30 Feet (12' more) Assistive device: Rolling walker (2 wheels) Gait  Pattern/deviations: Step-to pattern       General Gait Details: step to pattern for pain control,  steady gait with RW, no knee buckling ot LOB. no dizziness. improved wt shift to RLE with beginning step through gait   Stairs Stairs: Yes Stairs assistance: Contact guard assist, Supervision Stair Management: One rail Right, One rail Left, Step to pattern, Sideways Number of Stairs: 3 General stair comments: cues for sequence and technique; pt uses single rail or rail and husband HHA as second "rail"; pt is able to anle either direction as she has wide bil handrails   Wheelchair Mobility     Tilt Bed    Modified Rankin (Stroke Patients Only)       Balance                                            Cognition Arousal: Alert Behavior During Therapy: WFL for tasks assessed/performed Overall Cognitive Status: Within Functional Limits for tasks assessed                                          Exercises   General Comments        Pertinent Vitals/Pain Pain Assessment Pain Assessment: 0-10 Pain Score: 4  Pain Location: right knee Pain Descriptors / Indicators: Aching, Grimacing, Sore Pain Intervention(s): Limited activity within patient's tolerance, Monitored during session, Premedicated before session, Repositioned    Home Living  Prior Function            PT Goals (current goals can now be found in the care plan section) Acute Rehab PT Goals Patient Stated Goal: less pain PT Goal Formulation: With patient Time For Goal Achievement: 03/07/23 Potential to Achieve Goals: Good Progress towards PT goals: Progressing toward goals    Frequency    7X/week      PT Plan      Co-evaluation              AM-PAC PT "6 Clicks" Mobility   Outcome Measure  Help needed turning from your back to your side while in a flat bed without using bedrails?: A Little Help needed moving from lying on  your back to sitting on the side of a flat bed without using bedrails?: A Little Help needed moving to and from a bed to a chair (including a wheelchair)?: A Little Help needed standing up from a chair using your arms (e.g., wheelchair or bedside chair)?: None Help needed to walk in hospital room?: None Help needed climbing 3-5 steps with a railing? : A Little 6 Click Score: 20    End of Session Equipment Utilized During Treatment: Gait belt Activity Tolerance: Patient tolerated treatment well Patient left: with call bell/phone within reach;in bed;with bed alarm set Nurse Communication: Mobility status PT Visit Diagnosis: Other abnormalities of gait and mobility (R26.89);Difficulty in walking, not elsewhere classified (R26.2)     Time: 1439-1500 PT Time Calculation (min) (ACUTE ONLY): 21 min  Charges:    $Gait Training: 8-22 mins PT General Charges $$ ACUTE PT VISIT: 1 Visit                     Ondine Gemme, PT  Acute Rehab Dept [facility]) [phone]  03/02/2023    [facility] 03/02/2023, 3:23 PM

## 2023-03-02 NOTE — Progress Notes (Signed)
Patient discharged to home w/ husband. Given all belongings, instructions, equipment. Verbalized understanding of all instructions. Escorted to pov via w/c. 

## 2023-03-02 NOTE — Progress Notes (Signed)
 Physical Therapy Treatment Patient Details Name: ERENE FEENSTRA MRN: [id number] DOB: 20-Oct-1945 Today's Date: 03/02/2023   History of Present Illness 78 yo s/p R TKA on 02/28/23. PMH: L TKA, MDD, DM    PT Comments  Pt progressing toward goals. No dizziness with amb this am, tol incr distance and TKA exercises. Will see again in pm and pt will likely be ready to d/c later today    If plan is discharge home, recommend the following: A little help with walking and/or transfers;A little help with bathing/dressing/bathroom;Help with stairs or ramp for entrance;Assist for transportation;Assistance with cooking/housework   Can travel by private vehicle        Equipment Recommendations  None recommended by PT    Recommendations for Other Services       Precautions / Restrictions Precautions Precautions: Fall;Knee Required Braces or Orthoses: Knee Immobilizer - Right Knee Immobilizer - Right: Discontinue once straight leg raise with < 10 degree lag Restrictions Weight Bearing Restrictions Per Provider Order: No Other Position/Activity Restrictions: WBAT     Mobility  Bed Mobility Overal bed mobility: Needs Assistance Bed Mobility: Supine to Sit, Sit to Supine     Supine to sit: Supervision, HOB elevated Sit to supine: Min assist   General bed mobility comments: incr time, able to self assist with cues and leg lifter to supine; light assist to lift RLE on to bed    Transfers Overall transfer level: Needs assistance Equipment used: Rolling walker (2 wheels) Transfers: Sit to/from Stand Sit to Stand: Contact guard assist, Supervision           General transfer comment: cues for hand placement and RLE position    Ambulation/Gait Ambulation/Gait assistance: Contact guard assist, Supervision Gait Distance (Feet): 60 Feet Assistive device: Rolling walker (2 wheels) Gait Pattern/deviations: Step-to pattern, Decreased stance time - right       General Gait Details: step to  pattern for pain control,  steady gait with RW, no knee buckling ot LOB. no dizziness   Stairs             Wheelchair Mobility     Tilt Bed    Modified Rankin (Stroke Patients Only)       Balance                                            Cognition Arousal: Alert Behavior During Therapy: WFL for tasks assessed/performed Overall Cognitive Status: Within Functional Limits for tasks assessed                                          Exercises Total Joint Exercises Ankle Circles/Pumps: AROM, Both, 10 reps Quad Sets: AROM, Both, 5 reps Heel Slides: AAROM, Right, 5 reps Straight Leg Raises: AROM, Right, 5 reps    General Comments        Pertinent Vitals/Pain Pain Assessment Pain Assessment: 0-10 Pain Score: 5  Pain Location: right knee Pain Descriptors / Indicators: Aching, Grimacing, Sore Pain Intervention(s): Limited activity within patient's tolerance, Monitored during session, Premedicated before session, Repositioned    Home Living                          Prior Function  PT Goals (current goals can now be found in the care plan section) Acute Rehab PT Goals Patient Stated Goal: less pain PT Goal Formulation: With patient Time For Goal Achievement: 03/07/23 Potential to Achieve Goals: Good Progress towards PT goals: Progressing toward goals    Frequency    7X/week      PT Plan      Co-evaluation              AM-PAC PT "6 Clicks" Mobility   Outcome Measure  Help needed turning from your back to your side while in a flat bed without using bedrails?: A Little Help needed moving from lying on your back to sitting on the side of a flat bed without using bedrails?: A Little Help needed moving to and from a bed to a chair (including a wheelchair)?: A Little Help needed standing up from a chair using your arms (e.g., wheelchair or bedside chair)?: A Little Help needed to walk in  hospital room?: A Little Help needed climbing 3-5 steps with a railing? : A Little 6 Click Score: 18    End of Session Equipment Utilized During Treatment: Gait belt Activity Tolerance: Patient tolerated treatment well Patient left: with call bell/phone within reach;in bed;with bed alarm set;with family/visitor present Nurse Communication: Mobility status PT Visit Diagnosis: Other abnormalities of gait and mobility (R26.89);Difficulty in walking, not elsewhere classified (R26.2)     Time: 8119-1478 PT Time Calculation (min) (ACUTE ONLY): 21 min  Charges:    $Gait Training: 8-22 mins PT General Charges $$ ACUTE PT VISIT: 1 Visit                     Yoshito Gaza, PT  Acute Rehab Dept [facility]) [phone]  03/02/2023    [facility] 03/02/2023, 10:34 AM

## 2023-03-02 NOTE — Progress Notes (Signed)
   Subjective: 2 Days Post-Op Procedure(s) (LRB): TOTAL KNEE ARTHROPLASTY (Right) Patient seen in rounds for Dr. France Ina. Patient resting comfortably in bed this AM. States that yesterday she became dizzy when working with physical therapy. She has been up out of bed overnight without issues. Denies dizziness or lightheadedness. Denies N/V. Denies chest pain or calf pain. Reports pain as moderate. Tolerable.  Objective: Vital signs in last 24 hours: Temp:  [98.2 F (36.8 C)-99.4 F (37.4 C)] 99.4 F (37.4 C) (01/15 0607) Pulse Rate:  [77-95] 95 (01/15 0607) Resp:  [15-17] 17 (01/15 0607) BP: (139-160)/(57-75) 143/57 (01/15 0607) SpO2:  [94 %-97 %] 94 % (01/15 0607)  Intake/Output from previous day:  Intake/Output Summary (Last 24 hours) at 03/02/2023 0834 Last data filed at 03/01/2023 1502 Gross per 24 hour  Intake 26.83 ml  Output 200 ml  Net -173.17 ml    Intake/Output this shift: No intake/output data recorded.  Labs: Recent Labs    03/01/23 0340 03/02/23 0337  HGB 10.5* 10.3*   Recent Labs    03/01/23 0340 03/02/23 0337  WBC 11.3* 10.7*  RBC 4.06 4.01  HCT 33.3* 32.5*  PLT 271 264   Recent Labs    03/01/23 0340  NA 134*  K 3.7  CL 104  CO2 23  BUN 21  CREATININE 0.75  GLUCOSE 172*  CALCIUM  9.2   No results for input(s): "LABPT", "INR" in the last 72 hours.  Exam: General - Patient is Alert and Oriented Extremity - Neurologically intact Neurovascular intact Sensation intact distally Dorsiflexion/Plantar flexion intact Dressing/Incision - clean, dry, no drainage Motor Function - intact, moving foot and toes well on exam.  Past Medical History:  Diagnosis Date   ADD (attention deficit disorder)    Allergy    Anemia    Arthritis    Bilateral temporomandibular joint pain 05/30/2019   Chicken pox    Colon polyps    Depression    Diabetes mellitus without complication (HCC)    GERD (gastroesophageal reflux disease)    History of hiatal  hernia    History of kidney stones    Phlebitis    UTI (urinary tract infection)    UTI (urinary tract infection)    Varicose veins of bilateral lower extremities with other complications 07/03/2012    Assessment/Plan: 2 Days Post-Op Procedure(s) (LRB): TOTAL KNEE ARTHROPLASTY (Right) Principal Problem:   OA (osteoarthritis) of knee Active Problems:   Primary osteoarthritis of right knee  Estimated body mass index is 28.67 kg/m as calculated from the following:   Height as of this encounter: 5\' 4"  (1.626 m).   Weight as of this encounter: 75.8 kg.  DVT Prophylaxis - Aspirin  Weight-bearing as tolerated.  Continue physical therapy today. Expected discharge home pending progress with physical therapy and if symptoms well managed.  Alfreda Imus, PA-C Orthopedic Surgery [phone] 03/02/2023, 8:34 AM

## 2023-03-03 ENCOUNTER — Other Ambulatory Visit (HOSPITAL_COMMUNITY): Payer: Self-pay

## 2023-03-03 NOTE — Discharge Summary (Signed)
Physician Discharge Summary   Patient ID: Renee Young MRN: [id number] DOB/AGE: 77/22/1947 77 y.o.  Admit date: 02/28/2023 Discharge date: 03/02/2023  Primary Diagnosis: Osteoarthritis right knee    Admission Diagnoses:  Past Medical History:  Diagnosis Date   ADD (attention deficit disorder)    Allergy    Anemia    Arthritis    Bilateral temporomandibular joint pain 05/30/2019   Chicken pox    Colon polyps    Depression    Diabetes mellitus without complication (HCC)    GERD (gastroesophageal reflux disease)    History of hiatal hernia    History of kidney stones    Phlebitis    UTI (urinary tract infection)    UTI (urinary tract infection)    Varicose veins of bilateral lower extremities with other complications 07/03/2012   Discharge Diagnoses:   Principal Problem:   OA (osteoarthritis) of knee Active Problems:   Primary osteoarthritis of right knee  Estimated body mass index is 28.67 kg/m as calculated from the following:   Height as of this encounter: 5\' 4"  (1.626 m).   Weight as of this encounter: 75.8 kg.  Procedure:  Procedure(s) (LRB): TOTAL KNEE ARTHROPLASTY (Right)   Consults: None  HPI: Renee Young is a 78 y.o. year old female with end stage OA of her right knee with progressively worsening pain and dysfunction. She has constant pain, with activity and at rest and significant functional deficits with difficulties even with ADLs. She has had extensive non-op management including analgesics, injections of cortisone and viscosupplements, and home exercise program, but remains in significant pain with significant dysfunction.Radiographs show bone on bone arthritis medial and patellofemoral. She presents now for right Total Knee Arthroplasty.   Laboratory Data: Admission on 02/28/2023, Discharged on 03/02/2023  Component Date Value Ref Range Status   Glucose-Capillary 02/28/2023 105 (H)  70 - 99 mg/dL Final   Glucose reference range applies only to samples  taken after fasting for at least 8 hours.   Glucose-Capillary 02/28/2023 106 (H)  70 - 99 mg/dL Final   Glucose reference range applies only to samples taken after fasting for at least 8 hours.   Glucose-Capillary 02/28/2023 180 (H)  70 - 99 mg/dL Final   Glucose reference range applies only to samples taken after fasting for at least 8 hours.   WBC 03/01/2023 11.3 (H)  4.0 - 10.5 K/uL Final   RBC 03/01/2023 4.06  3.87 - 5.11 MIL/uL Final   Hemoglobin 03/01/2023 10.5 (L)  12.0 - 15.0 g/dL Final   HCT 40/98/1191 33.3 (L)  36.0 - 46.0 % Final   MCV 03/01/2023 82.0  80.0 - 100.0 fL Final   MCH 03/01/2023 25.9 (L)  26.0 - 34.0 pg Final   MCHC 03/01/2023 31.5  30.0 - 36.0 g/dL Final   RDW 47/82/9562 14.6  11.5 - 15.5 % Final   Platelets 03/01/2023 271  150 - 400 K/uL Final   nRBC 03/01/2023 0.0  0.0 - 0.2 % Final   Performed at [facility], [address], [place], [place] [address]   Sodium 03/01/2023 134 (L)  135 - 145 mmol/L Final   Potassium 03/01/2023 3.7  3.5 - 5.1 mmol/L Final   Chloride 03/01/2023 104  98 - 111 mmol/L Final   CO2 03/01/2023 23  22 - 32 mmol/L Final   Glucose, Bld 03/01/2023 172 (H)  70 - 99 mg/dL Final   Glucose reference range applies only to samples taken after fasting for at  least 8 hours.   BUN 03/01/2023 21  8 - 23 mg/dL Final   Creatinine, Ser 03/01/2023 0.75  0.44 - 1.00 mg/dL Final   Calcium 16/11/9602 9.2  8.9 - 10.3 mg/dL Final   GFR, Estimated 03/01/2023 >60  >60 mL/min Final   Comment: (NOTE) Calculated using the CKD-EPI Creatinine Equation (2021)    Anion gap 03/01/2023 7  5 - 15 Final   Performed at [facility], [address] [place], [place] [address]   Glucose-Capillary 02/28/2023 244 (H)  70 - 99 mg/dL Final   Glucose reference range applies only to samples taken after fasting for at least 8 hours.   Glucose-Capillary 03/01/2023 154 (H)  70 - 99 mg/dL Final   Glucose reference range applies only to  samples taken after fasting for at least 8 hours.   Glucose-Capillary 03/01/2023 125 (H)  70 - 99 mg/dL Final   Glucose reference range applies only to samples taken after fasting for at least 8 hours.   Glucose-Capillary 03/01/2023 110 (H)  70 - 99 mg/dL Final   Glucose reference range applies only to samples taken after fasting for at least 8 hours.   WBC 03/02/2023 10.7 (H)  4.0 - 10.5 K/uL Final   RBC 03/02/2023 4.01  3.87 - 5.11 MIL/uL Final   Hemoglobin 03/02/2023 10.3 (L)  12.0 - 15.0 g/dL Final   HCT 11/91/4782 32.5 (L)  36.0 - 46.0 % Final   MCV 03/02/2023 81.0  80.0 - 100.0 fL Final   MCH 03/02/2023 25.7 (L)  26.0 - 34.0 pg Final   MCHC 03/02/2023 31.7  30.0 - 36.0 g/dL Final   RDW 95/62/1308 14.5  11.5 - 15.5 % Final   Platelets 03/02/2023 264  150 - 400 K/uL Final   nRBC 03/02/2023 0.0  0.0 - 0.2 % Final   Performed at [facility], [address] [place], [place] [address]   Glucose-Capillary 03/01/2023 188 (H)  70 - 99 mg/dL Final   Glucose reference range applies only to samples taken after fasting for at least 8 hours.   Glucose-Capillary 03/02/2023 145 (H)  70 - 99 mg/dL Final   Glucose reference range applies only to samples taken after fasting for at least 8 hours.   Glucose-Capillary 03/02/2023 114 (H)  70 - 99 mg/dL Final   Glucose reference range applies only to samples taken after fasting for at least 8 hours.  Hospital Outpatient Visit on 02/23/2023  Component Date Value Ref Range Status   MRSA, PCR 02/23/2023 NEGATIVE  NEGATIVE Final   Staphylococcus aureus 02/23/2023 POSITIVE (A)  NEGATIVE Final   Comment: (NOTE) The Xpert SA Assay (FDA approved for NASAL specimens in patients 34 years of age and older), is one component of a comprehensive surveillance program. It is not intended to diagnose infection nor to guide or monitor treatment. Performed at [facility], [address], [place], [place] [address]    Hgb A1c MFr  Bld 02/23/2023 5.9 (H)  4.8 - 5.6 % Final   Comment: (NOTE)         Prediabetes: 5.7 - 6.4         Diabetes: >6.4         Glycemic control for adults with diabetes: <7.0    Mean Plasma Glucose 02/23/2023 123  mg/dL Final   Comment: (NOTE) Performed At: [facility] [address] [place], [place] [id number] Jolene Schimke MD WN:[id number]    Sodium 02/23/2023 139  135 - 145 mmol/L Final   Potassium 02/23/2023 4.5  3.5 - 5.1 mmol/L Final   Chloride 02/23/2023 104  98 - 111 mmol/L Final   CO2 02/23/2023 26  22 - 32 mmol/L Final   Glucose, Bld 02/23/2023 98  70 - 99 mg/dL Final   Glucose reference range applies only to samples taken after fasting for at least 8 hours.   BUN 02/23/2023 20  8 - 23 mg/dL Final   Creatinine, Ser 02/23/2023 0.68  0.44 - 1.00 mg/dL Final   Calcium 16/11/9602 10.3  8.9 - 10.3 mg/dL Final   GFR, Estimated 02/23/2023 >60  >60 mL/min Final   Comment: (NOTE) Calculated using the CKD-EPI Creatinine Equation (2021)    Anion gap 02/23/2023 9  5 - 15 Final   Performed at [facility], [address] [place], [place] [address]   WBC 02/23/2023 6.1  4.0 - 10.5 K/uL Final   RBC 02/23/2023 5.08  3.87 - 5.11 MIL/uL Final   Hemoglobin 02/23/2023 13.1  12.0 - 15.0 g/dL Final   HCT 11/91/4782 42.2  36.0 - 46.0 % Final   MCV 02/23/2023 83.1  80.0 - 100.0 fL Final   MCH 02/23/2023 25.8 (L)  26.0 - 34.0 pg Final   MCHC 02/23/2023 31.0  30.0 - 36.0 g/dL Final   RDW 95/62/1308 14.6  11.5 - 15.5 % Final   Platelets 02/23/2023 345  150 - 400 K/uL Final   nRBC 02/23/2023 0.0  0.0 - 0.2 % Final   Performed at [facility], [address] [place], [place] [address]   Glucose-Capillary 02/23/2023 87  70 - 99 mg/dL Final   Glucose reference range applies only to samples taken after fasting for at least 8 hours.     X-Rays:No results found.  EKG: Orders placed or performed during the hospital encounter of 02/23/23   EKG 12 lead  per protocol   EKG 12 lead per protocol     Hospital Course: Renee Young is a 78 y.o. who was admitted to [facility]. They were brought to the operating room on 02/28/2023 and underwent Procedure(s): TOTAL KNEE ARTHROPLASTY.  Patient tolerated the procedure well and was later transferred to the recovery room and then to the orthopaedic floor for postoperative care. They were given PO and IV analgesics for pain control following their surgery. They were given 24 hours of postoperative antibiotics of  Anti-infectives (From admission, onward)    Start     Dose/Rate Route Frequency Ordered Stop   02/28/23 1530  ceFAZolin (ANCEF) IVPB 2g/100 mL premix        2 g 200 mL/hr over 30 Minutes Intravenous Every 6 hours 02/28/23 1225 02/28/23 2251   02/28/23 0730  ceFAZolin (ANCEF) IVPB 2g/100 mL premix        2 g 200 mL/hr over 30 Minutes Intravenous On call to O.R. 02/28/23 6962 02/28/23 0957      and started on DVT prophylaxis in the form of Aspirin.   PT and OT were ordered for total joint protocol. Discharge planning consulted to help with post-op disposition and equipment needs. Patient had a fair night on the evening of surgery. They started to get up OOB with physical therapy on POD #0. Continued to work with physical therapy into POD #2. Patient was seen during rounds on day two and was ready to go home pending progress with physical therapy. Patient worked with physical therapy for a total of 5 sessions  and was meeting their goals. Dressing was changed and the incision was C/D/I.  They were discharged home later that day in stable condition.  Diet: Regular diet Activity: WBAT Follow-up: in 2 weeks Disposition: Home Discharged Condition: stable   Discharge Instructions     Call MD / Call 911   Complete by: As directed    If you experience chest pain or shortness of breath, CALL 911 and be transported to the hospital emergency room.  If you develope a fever above 101 F, pus  (white drainage) or increased drainage or redness at the wound, or calf pain, call your surgeon's office.   Change dressing   Complete by: As directed    You may remove the bulky bandage (ACE wrap and gauze) two days after surgery. You will have an adhesive waterproof bandage underneath. Leave this in place until your first follow-up appointment.   Constipation Prevention   Complete by: As directed    Drink plenty of fluids.  Prune juice may be helpful.  You may use a stool softener, such as Colace (over the counter) 100 mg twice a day.  Use MiraLax (over the counter) for constipation as needed.   Diet - low sodium heart healthy   Complete by: As directed    Do not put a pillow under the knee. Place it under the heel.   Complete by: As directed    Driving restrictions   Complete by: As directed    No driving for two weeks   Post-operative opioid taper instructions:   Complete by: As directed    POST-OPERATIVE OPIOID TAPER INSTRUCTIONS: It is important to wean off of your opioid medication as soon as possible. If you do not need pain medication after your surgery it is ok to stop day one. Opioids include: Codeine, Hydrocodone(Norco, Vicodin), Oxycodone(Percocet, oxycontin) and hydromorphone amongst others.  Long term and even short term use of opiods can cause: Increased pain response Dependence Constipation Depression Respiratory depression And more.  Withdrawal symptoms can include Flu like symptoms Nausea, vomiting And more Techniques to manage these symptoms Hydrate well Eat regular healthy meals Stay active Use relaxation techniques(deep breathing, meditating, yoga) Do Not substitute Alcohol to help with tapering If you have been on opioids for less than two weeks and do not have pain than it is ok to stop all together.  Plan to wean off of opioids This plan should start within one week post op of your joint replacement. Maintain the same interval or time between taking  each dose and first decrease the dose.  Cut the total daily intake of opioids by one tablet each day Next start to increase the time between doses. The last dose that should be eliminated is the evening dose.      TED hose   Complete by: As directed    Use stockings (TED hose) for three weeks on both leg(s).  You may remove them at night for sleeping.   Weight bearing as tolerated   Complete by: As directed       Allergies as of 03/02/2023       Reactions   Buckwheat Other (See Comments)   Nitrofurantoin Hives, Swelling   Penicillins Swelling   Tolerated Cephalosporin Date: 10/09/19.        Medication List     TAKE these medications    Accu-Chek Guide test strip Generic drug: glucose blood   Accu-Chek Softclix Lancets lancets   acetaminophen 650 MG CR tablet Commonly known as:  TYLENOL Take 650 mg by mouth every 8 (eight) hours as needed for pain.   amphetamine-dextroamphetamine 30 MG tablet Commonly known as: ADDERALL Take 1 tablet by mouth 2 (two) times daily. What changed: when to take this   aspirin 81 MG chewable tablet Chew 1 tablet (81 mg total) by mouth 2 (two) times daily for 20 days. Then take one 81 mg aspirin once a day for three weeks. Then discontinue aspirin.   blood glucose meter kit and supplies Kit Dispense based on patient and insurance preference. Use up to four times daily as directed. (FOR ICD-9 250.00, 250.01).   chlorhexidine 4 % external liquid Commonly known as: HIBICLENS Apply 15 mLs (1 Application total) topically as directed for 30 doses. Use as directed daily for 5 days every other week for 6 weeks.   COSAMIN DS PO Take 2 tablets by mouth in the morning.   cyclobenzaprine 5 MG tablet Commonly known as: FLEXERIL Take 1 tablet (5 mg total) by mouth 3 (three) times daily as needed for muscle spasms.   FLUoxetine 20 MG capsule Commonly known as: PROZAC Take 40 mg by mouth at bedtime.   Januvia 25 MG tablet Generic drug:  sitaGLIPtin TAKE 1 TABLET EVERY DAY What changed: when to take this   mupirocin ointment 2 % Commonly known as: BACTROBAN Place 1 Application into the nose 2 (two) times daily for 60 doses. Use as directed 2 times daily for 5 days every other week for 6 weeks.   ondansetron 4 MG tablet Commonly known as: ZOFRAN Take 1 tablet (4 mg total) by mouth every 6 (six) hours as needed for nausea.   oxyCODONE 5 MG immediate release tablet Commonly known as: Oxy IR/ROXICODONE Take 1-2 tablets (5-10 mg total) by mouth every 6 (six) hours as needed for severe pain (pain score 7-10) (pain score 4-6).   polyethylene glycol-electrolytes 420 g solution Commonly known as: NuLYTELY TAKE 4,000 ML'S BY MOUTH ONCE FOR 1 DOSE   RED YEAST RICE PO Take 2 capsules by mouth at bedtime.   SALONPAS PAIN RELIEF PATCH EX Place 1 patch onto the skin daily as needed (pain.).   VITAMIN D PO Take 2,000 Units by mouth in the morning.   Voltaren 1 % Gel Generic drug: diclofenac Sodium Apply 2 g topically 4 (four) times daily as needed (pain.).               Discharge Care Instructions  (From admission, onward)           Start     Ordered   03/01/23 0000  Weight bearing as tolerated        03/01/23 0741   03/01/23 0000  Change dressing       Comments: You may remove the bulky bandage (ACE wrap and gauze) two days after surgery. You will have an adhesive waterproof bandage underneath. Leave this in place until your first follow-up appointment.   03/01/23 0741            Follow-up Information     Ollen Gross, MD. Go on 03/15/2023.   Specialty: Orthopedic Surgery Why: You are scheduled for first post op appt on Tuesday January 28 at 2:30pm. Contact information: [address] [place] [address] [phone]                 Signed: R. Arcola Jansky, PA-C Orthopedic Surgery 03/03/2023, 12:46 PM

## 2023-03-08 ENCOUNTER — Other Ambulatory Visit (HOSPITAL_COMMUNITY): Payer: Self-pay

## 2023-03-08 ENCOUNTER — Other Ambulatory Visit: Payer: Self-pay | Admitting: Family Medicine

## 2023-03-08 MED ORDER — ACCU-CHEK SOFTCLIX LANCETS MISC: 1 refills | Status: AC

## 2023-03-08 NOTE — Telephone Encounter (Signed)
ACCU-CHEK GUIDE test strip [[id number]] unavailable for reorder. Routing for approval.

## 2023-03-08 NOTE — Telephone Encounter (Signed)
Copied from CRM [id number]. Topic: General - Other >> Mar 08, 2023 10:20 AM Everette C wrote: Reason for CRM: Medication Refill - Most Recent Primary Care Visit:  Provider: Della Goo F Department: [facility] Visit Type: OFFICE VISIT Date: 02/22/2023  Medication: Accu-Chek Softclix Lancets lancets [[id number]]  ACCU-CHEK GUIDE test strip [[id number]]  Has the patient contacted their pharmacy? No (Agent: If no, request that the patient contact the pharmacy for the refill. If patient does not wish to contact the pharmacy document the reason why and proceed with request.) (Agent: If yes, when and what did the pharmacy advise?)  Is this the correct pharmacy for this prescription? Yes If no, delete pharmacy and type the correct one.  This is the patient's preferred pharmacy:    [id number] [place], [place] - [address] [place] [address] Phone: [phone] Fax: [phone]   Has the prescription been filled recently? No  Is the patient out of the medication? Yes  Has the patient been seen for an appointment in the last year OR does the patient have an upcoming appointment? Yes  Can we respond through MyChart? No  Agent: Please be advised that Rx refills may take up to 3 business days. We ask that you follow-up with your pharmacy.

## 2023-03-11 DIAGNOSIS — M1711 Unilateral primary osteoarthritis, right knee: Secondary | ICD-10-CM | POA: Diagnosis not present

## 2023-03-11 DIAGNOSIS — Z4789 Encounter for other orthopedic aftercare: Secondary | ICD-10-CM | POA: Diagnosis not present

## 2023-03-11 DIAGNOSIS — M25561 Pain in right knee: Secondary | ICD-10-CM | POA: Diagnosis not present

## 2023-03-15 DIAGNOSIS — M1711 Unilateral primary osteoarthritis, right knee: Secondary | ICD-10-CM | POA: Diagnosis not present

## 2023-03-15 DIAGNOSIS — Z4789 Encounter for other orthopedic aftercare: Secondary | ICD-10-CM | POA: Diagnosis not present

## 2023-03-15 DIAGNOSIS — M25561 Pain in right knee: Secondary | ICD-10-CM | POA: Diagnosis not present

## 2023-03-18 DIAGNOSIS — M1711 Unilateral primary osteoarthritis, right knee: Secondary | ICD-10-CM | POA: Diagnosis not present

## 2023-03-18 DIAGNOSIS — M25561 Pain in right knee: Secondary | ICD-10-CM | POA: Diagnosis not present

## 2023-03-18 DIAGNOSIS — Z4789 Encounter for other orthopedic aftercare: Secondary | ICD-10-CM | POA: Diagnosis not present

## 2023-03-21 DIAGNOSIS — M1711 Unilateral primary osteoarthritis, right knee: Secondary | ICD-10-CM | POA: Diagnosis not present

## 2023-03-21 DIAGNOSIS — M25561 Pain in right knee: Secondary | ICD-10-CM | POA: Diagnosis not present

## 2023-03-21 DIAGNOSIS — Z4789 Encounter for other orthopedic aftercare: Secondary | ICD-10-CM | POA: Diagnosis not present

## 2023-03-23 DIAGNOSIS — M1711 Unilateral primary osteoarthritis, right knee: Secondary | ICD-10-CM | POA: Diagnosis not present

## 2023-03-23 DIAGNOSIS — M25561 Pain in right knee: Secondary | ICD-10-CM | POA: Diagnosis not present

## 2023-03-23 DIAGNOSIS — Z4789 Encounter for other orthopedic aftercare: Secondary | ICD-10-CM | POA: Diagnosis not present

## 2023-03-28 DIAGNOSIS — Z4789 Encounter for other orthopedic aftercare: Secondary | ICD-10-CM | POA: Diagnosis not present

## 2023-03-28 DIAGNOSIS — M1711 Unilateral primary osteoarthritis, right knee: Secondary | ICD-10-CM | POA: Diagnosis not present

## 2023-03-28 DIAGNOSIS — M25561 Pain in right knee: Secondary | ICD-10-CM | POA: Diagnosis not present

## 2023-03-30 DIAGNOSIS — M25561 Pain in right knee: Secondary | ICD-10-CM | POA: Diagnosis not present

## 2023-03-30 DIAGNOSIS — Z4789 Encounter for other orthopedic aftercare: Secondary | ICD-10-CM | POA: Diagnosis not present

## 2023-03-30 DIAGNOSIS — M1711 Unilateral primary osteoarthritis, right knee: Secondary | ICD-10-CM | POA: Diagnosis not present

## 2023-04-04 ENCOUNTER — Other Ambulatory Visit: Payer: Self-pay | Admitting: Family Medicine

## 2023-04-04 DIAGNOSIS — M1711 Unilateral primary osteoarthritis, right knee: Secondary | ICD-10-CM | POA: Diagnosis not present

## 2023-04-04 DIAGNOSIS — Z4789 Encounter for other orthopedic aftercare: Secondary | ICD-10-CM | POA: Diagnosis not present

## 2023-04-04 DIAGNOSIS — M25561 Pain in right knee: Secondary | ICD-10-CM | POA: Diagnosis not present

## 2023-04-04 NOTE — Telephone Encounter (Signed)
Medication Refill -  Most Recent Primary Care Visit:  Provider: Della Goo F  Department: [facility]  Visit Type: OFFICE VISIT  Date: 02/22/2023  Medication: blood glucose meter kit and supplies KIT   Has the patient contacted their pharmacy? No Pt thought she had to call in  Is this the correct pharmacy for this prescription? yes   This is the patient's preferred pharmacy:   [id number] [place], [place] - [address] [place] [address] Phone: [phone] Fax: [phone]   Has the prescription been filled recently? no  Is the patient out of the medication? yes  Has the patient been seen for an appointment in the last year OR does the patient have an upcoming appointment? yes  Can we respond through MyChart? yes  Agent: Please be advised that Rx refills may take up to 3 business days. We ask that you follow-up with your pharmacy.

## 2023-04-05 DIAGNOSIS — Z5189 Encounter for other specified aftercare: Secondary | ICD-10-CM | POA: Diagnosis not present

## 2023-04-05 MED ORDER — BLOOD GLUCOSE MONITOR KIT: PACK | 0 refills | Status: AC

## 2023-04-05 NOTE — Telephone Encounter (Signed)
Requested medication (s) are due for refill today: Yes  Requested medication (s) are on the active medication list: Yes  Last refill:  10/26/19  Future visit scheduled: Yes  Notes to clinic:  See request.    Requested Prescriptions  Pending Prescriptions Disp Refills   blood glucose meter kit and supplies KIT 1 each 0    Sig: Dispense based on patient and insurance preference. Use up to four times daily as directed. (FOR ICD-9 250.00, 250.01).     Endocrinology: Diabetes - Testing Supplies Passed - 04/05/2023 10:27 AM      Passed - Valid encounter within last 12 months    Recent Outpatient Visits           1 month ago Abnormal skin growth   [facility] Berniece Salines, FNP   1 month ago Preoperative clearance   [facility] Berniece Salines, FNP   6 months ago Annual physical exam   [facility] Danelle Berry, PA-C   11 months ago Contusion of rib on left side, initial encounter   [facility] Margarita Mail, DO   11 months ago Type 2 diabetes mellitus with hyperglycemia, without long-term current use of insulin [facility])   [facility] Danelle Berry, [place]

## 2023-04-11 DIAGNOSIS — M25561 Pain in right knee: Secondary | ICD-10-CM | POA: Diagnosis not present

## 2023-04-11 DIAGNOSIS — M1711 Unilateral primary osteoarthritis, right knee: Secondary | ICD-10-CM | POA: Diagnosis not present

## 2023-04-11 DIAGNOSIS — Z4789 Encounter for other orthopedic aftercare: Secondary | ICD-10-CM | POA: Diagnosis not present

## 2023-04-13 DIAGNOSIS — Z4789 Encounter for other orthopedic aftercare: Secondary | ICD-10-CM | POA: Diagnosis not present

## 2023-04-13 DIAGNOSIS — M25561 Pain in right knee: Secondary | ICD-10-CM | POA: Diagnosis not present

## 2023-04-13 DIAGNOSIS — M1711 Unilateral primary osteoarthritis, right knee: Secondary | ICD-10-CM | POA: Diagnosis not present

## 2023-04-14 LAB — HM DIABETES EYE EXAM

## 2023-04-28 ENCOUNTER — Encounter: Payer: Self-pay | Admitting: Family Medicine

## 2023-04-28 ENCOUNTER — Ambulatory Visit: Admitting: Family Medicine

## 2023-04-28 VITALS — BP 128/70 | HR 73 | Resp 16 | Ht 64.0 in | Wt 167.0 lb

## 2023-04-28 DIAGNOSIS — D649 Anemia, unspecified: Secondary | ICD-10-CM

## 2023-04-28 DIAGNOSIS — E782 Mixed hyperlipidemia: Secondary | ICD-10-CM | POA: Diagnosis not present

## 2023-04-28 DIAGNOSIS — Z5181 Encounter for therapeutic drug level monitoring: Secondary | ICD-10-CM | POA: Diagnosis not present

## 2023-04-28 DIAGNOSIS — E1165 Type 2 diabetes mellitus with hyperglycemia: Secondary | ICD-10-CM | POA: Diagnosis not present

## 2023-04-28 DIAGNOSIS — Z7984 Long term (current) use of oral hypoglycemic drugs: Secondary | ICD-10-CM

## 2023-04-28 DIAGNOSIS — Z13820 Encounter for screening for osteoporosis: Secondary | ICD-10-CM

## 2023-04-28 MED ORDER — SITAGLIPTIN PHOSPHATE 25 MG PO TABS: 25.0000 mg | ORAL_TABLET | Freq: Every day | ORAL | 1 refills | Status: DC

## 2023-04-28 NOTE — Patient Instructions (Signed)
 Return to clinic after 05/24/2023 for your labs and be ready to aldo provide a urine sample  Lab Results  Component Value Date   HGBA1C 5.9 (H) 02/23/2023   Lab Results  Component Value Date   CHOL 277 (H) 04/13/2022   HDL 44 (L) 04/13/2022   LDLCALC 201 (H) 04/13/2022   LDLDIRECT 222.0 03/07/2009   TRIG 156 (H) 04/13/2022   CHOLHDL 6.3 (H) 04/13/2022   Results for orders placed or performed during the hospital encounter of 02/28/23  Glucose, capillary   Collection Time: 02/28/23  7:40 AM  Result Value Ref Range   Glucose-Capillary 105 (H) 70 - 99 mg/dL  Glucose, capillary   Collection Time: 02/28/23 11:03 AM  Result Value Ref Range   Glucose-Capillary 106 (H) 70 - 99 mg/dL  Glucose, capillary   Collection Time: 02/28/23  4:45 PM  Result Value Ref Range   Glucose-Capillary 180 (H) 70 - 99 mg/dL  Glucose, capillary   Collection Time: 02/28/23  9:40 PM  Result Value Ref Range   Glucose-Capillary 244 (H) 70 - 99 mg/dL  CBC   Collection Time: 03/01/23  3:40 AM  Result Value Ref Range   WBC 11.3 (H) 4.0 - 10.5 K/uL   RBC 4.06 3.87 - 5.11 MIL/uL   Hemoglobin 10.5 (L) 12.0 - 15.0 g/dL   HCT 72.5 (L) 36.6 - 44.0 %   MCV 82.0 80.0 - 100.0 fL   MCH 25.9 (L) 26.0 - 34.0 pg   MCHC 31.5 30.0 - 36.0 g/dL   RDW 34.7 42.5 - 95.6 %   Platelets 271 150 - 400 K/uL   nRBC 0.0 0.0 - 0.2 %  Basic metabolic panel   Collection Time: 03/01/23  3:40 AM  Result Value Ref Range   Sodium 134 (L) 135 - 145 mmol/L   Potassium 3.7 3.5 - 5.1 mmol/L   Chloride 104 98 - 111 mmol/L   CO2 23 22 - 32 mmol/L   Glucose, Bld 172 (H) 70 - 99 mg/dL   BUN 21 8 - 23 mg/dL   Creatinine, Ser 3.87 0.44 - 1.00 mg/dL   Calcium 9.2 8.9 - 56.4 mg/dL   GFR, Estimated >33 >29 mL/min   Anion gap 7 5 - 15  Glucose, capillary   Collection Time: 03/01/23  7:55 AM  Result Value Ref Range   Glucose-Capillary 154 (H) 70 - 99 mg/dL  Glucose, capillary   Collection Time: 03/01/23 11:24 AM  Result Value Ref Range    Glucose-Capillary 125 (H) 70 - 99 mg/dL  Glucose, capillary   Collection Time: 03/01/23  4:42 PM  Result Value Ref Range   Glucose-Capillary 110 (H) 70 - 99 mg/dL  Glucose, capillary   Collection Time: 03/01/23  9:54 PM  Result Value Ref Range   Glucose-Capillary 188 (H) 70 - 99 mg/dL  CBC   Collection Time: 03/02/23  3:37 AM  Result Value Ref Range   WBC 10.7 (H) 4.0 - 10.5 K/uL   RBC 4.01 3.87 - 5.11 MIL/uL   Hemoglobin 10.3 (L) 12.0 - 15.0 g/dL   HCT 51.8 (L) 84.1 - 66.0 %   MCV 81.0 80.0 - 100.0 fL   MCH 25.7 (L) 26.0 - 34.0 pg   MCHC 31.7 30.0 - 36.0 g/dL   RDW 63.0 16.0 - 10.9 %   Platelets 264 150 - 400 K/uL   nRBC 0.0 0.0 - 0.2 %  Glucose, capillary   Collection Time: 03/02/23  7:48 AM  Result Value Ref Range  Glucose-Capillary 145 (H) 70 - 99 mg/dL  Glucose, capillary   Collection Time: 03/02/23 11:53 AM  Result Value Ref Range   Glucose-Capillary 114 (H) 70 - 99 mg/dL

## 2023-04-28 NOTE — Assessment & Plan Note (Signed)
 Has been well controlled for years with [place] only DM foot exam done today Labs not due but she is concerned with slightly increased morning sugars (114)

## 2023-04-28 NOTE — Assessment & Plan Note (Addendum)
 Lipids have been very elevated and she has T2DM but she has been resistant to any rx treatment She wants to see how her red yeast supplement is helping her cholesterol Labs ordered Lab Results  Component Value Date   CHOL 277 (H) 04/13/2022   HDL 44 (L) 04/13/2022   LDLCALC 201 (H) 04/13/2022   LDLDIRECT 222.0 03/07/2009   TRIG 156 (H) 04/13/2022   CHOLHDL 6.3 (H) 04/13/2022   The 10-year ASCVD risk score (Arnett DK, et al., 2019) is: 34.1%   Values used to calculate the score:     Age: 78 years     Sex: Female     Is Non-Hispanic African American: No     Diabetic: Yes     Tobacco smoker: No     Systolic Blood Pressure: 128 mmHg     Is BP treated: No     HDL Cholesterol: 44 mg/dL     Total Cholesterol: 277 mg/dL  Multiple prior discussions of labs results, ASCVD risk and indication for statins or she could do repatha

## 2023-04-28 NOTE — Progress Notes (Signed)
 Name: Renee Young   MRN: [id number]    DOB: 1945/06/18   Date:04/28/2023       Progress Note  Chief Complaint  Patient presents with   Medical Management of Chronic Issues     Subjective:   Renee Young is a 78 y.o. female, presents to clinic for routine follow up on chronic conditions  DM well controlled, due for foot exam Lab Results  Component Value Date   HGBA1C 5.9 (H) 02/23/2023   Some mild anemia with recent surgery Hemoglobin  Date Value Ref Range Status  03/02/2023 10.3 (L) 12.0 - 15.0 g/dL Final  78/29/5621 30.8 (L) 12.0 - 15.0 g/dL Final  65/78/4696 29.5 12.0 - 15.0 g/dL Final  28/41/3244 01.0 11.7 - 15.5 g/dL Final   HLD not on meds, she wants to recheck labs    Current Outpatient Medications:    ACCU-CHEK GUIDE test strip, , Disp: , Rfl:    Accu-Chek Softclix Lancets lancets, Check glucose twice a day, Disp: 100 each, Rfl: 1   acetaminophen (TYLENOL) 650 MG CR tablet, Take 650 mg by mouth every 8 (eight) hours as needed for pain., Disp: , Rfl:    amphetamine-dextroamphetamine (ADDERALL) 30 MG tablet, Take 1 tablet by mouth 2 (two) times daily. (Patient taking differently: Take 30 mg by mouth in the morning.), Disp: , Rfl:    blood glucose meter kit and supplies KIT, Dispense based on patient and insurance preference. Use up to four times daily as directed. (FOR ICD-9 250.00, 250.01)., Disp: 1 each, Rfl: 0   diclofenac Sodium (VOLTAREN) 1 % GEL, Apply 2 g topically 4 (four) times daily as needed (pain.)., Disp: , Rfl:    FLUoxetine (PROZAC) 20 MG capsule, Take 40 mg by mouth at bedtime. , Disp: , Rfl:    Glucosamine-Chondroitin (COSAMIN DS PO), Take 2 tablets by mouth in the morning., Disp: , Rfl:    Menthol-Methyl Salicylate (SALONPAS PAIN RELIEF PATCH EX), Place 1 patch onto the skin daily as needed (pain.)., Disp: , Rfl:    Red Yeast Rice Extract (RED YEAST RICE PO), Take 2 capsules by mouth at bedtime., Disp: , Rfl:    sitaGLIPtin (JANUVIA) 25 MG tablet,  TAKE 1 TABLET EVERY DAY (Patient taking differently: Take 25 mg by mouth every evening.), Disp: 90 tablet, Rfl: 0   VITAMIN D PO, Take 2,000 Units by mouth in the morning., Disp: , Rfl:    chlorhexidine (HIBICLENS) 4 % external liquid, Apply 15 mLs (1 Application total) topically as directed for 30 doses. Use as directed daily for 5 days every other week for 6 weeks. (Patient not taking: Reported on 04/28/2023), Disp: 946 mL, Rfl: 1   cyclobenzaprine (FLEXERIL) 5 MG tablet, Take 1 tablet (5 mg total) by mouth 3 (three) times daily as needed for muscle spasms. (Patient not taking: Reported on 04/28/2023), Disp: 40 tablet, Rfl: 0   ondansetron (ZOFRAN) 4 MG tablet, Take 1 tablet (4 mg total) by mouth every 6 (six) hours as needed for nausea. (Patient not taking: Reported on 04/28/2023), Disp: 20 tablet, Rfl: 0   oxyCODONE (OXY IR/ROXICODONE) 5 MG immediate release tablet, Take 1-2 tablets (5-10 mg total) by mouth every 6 (six) hours as needed for severe pain (pain score 7-10) (pain score 4-6). (Patient not taking: Reported on 04/28/2023), Disp: 42 tablet, Rfl: 0   polyethylene glycol-electrolytes (NULYTELY) 420 g solution, TAKE 4,000 ML'S BY MOUTH ONCE FOR 1 DOSE (Patient not taking: Reported on 04/28/2023), Disp: , Rfl:  Patient Active Problem List   Diagnosis Date Noted   Primary osteoarthritis of right knee 02/28/2023   Adenomatous polyp of colon 10/27/2022   MDD (major depressive disorder), recurrent, in partial remission (HCC) 01/06/2021   S/P total knee arthroplasty 10/08/2019   Microcytic erythrocytes 09/24/2019   Family history of thyroid disease 05/30/2019   Primary osteoarthritis of both knees 05/30/2019   History of colonic polyps 12/17/2016   OA (osteoarthritis) of knee 03/21/2009   Type 2 diabetes mellitus with hyperglycemia, without long-term current use of insulin (HCC) 07/19/2007   Mixed hyperlipidemia 07/19/2007   Attention deficit disorder (ADD) 07/19/2007    Past Surgical  History:  Procedure Laterality Date   CESAREAN SECTION     x2   COLONOSCOPY WITH PROPOFOL N/A 10/27/2022   Procedure: COLONOSCOPY WITH PROPOFOL;  Surgeon: Wyline Mood, MD;  Location[facility] ENDOSCOPY;  Service: Gastroenterology;  Laterality: N/A;   POLYPECTOMY  10/27/2022   Procedure: POLYPECTOMY;  Surgeon: Wyline Mood, MD;  Location: [facility] ENDOSCOPY;  Service: Gastroenterology;;   TOTAL KNEE ARTHROPLASTY Left 10/08/2019   Procedure: TOTAL KNEE ARTHROPLASTY;  Surgeon: Ollen Gross, MD;  Location: [facility] ORS;  Service: Orthopedics;  Laterality: Left;   TOTAL KNEE ARTHROPLASTY Right 02/28/2023   Procedure: TOTAL KNEE ARTHROPLASTY;  Surgeon: Ollen Gross, MD;  Location: [facility] ORS;  Service: Orthopedics;  Laterality: Right;   TUBAL LIGATION     URETEROCELE INCISION     VASCULAR SURGERY     varicose vein laser surgery   WISDOM TOOTH EXTRACTION      Family History  Problem Relation Age of Onset   Cancer Mother    Kidney disease Mother    Diabetes Father    Heart disease Father    Hyperlipidemia Father    Hypertension Father    Stroke Father    Arthritis Sister    Heart attack Sister    Obesity Brother    Arthritis Brother    Arthritis Daughter    Heart disease Maternal Grandmother    Stroke Maternal Grandmother    Hypertension Maternal Grandmother    Heart disease Maternal Grandfather    Hyperlipidemia Maternal Grandfather    Stroke Paternal Grandmother    Heart disease Paternal Grandmother    Heart disease Paternal Grandfather    Heart attack Paternal Grandfather     Social History   Tobacco Use   Smoking status: Never   Smokeless tobacco: Never  Vaping Use   Vaping status: Never Used  Substance Use Topics   Alcohol use: Not Currently    Alcohol/week: 1.0 standard drink of alcohol    Types: 1 Glasses of wine per week    Comment: occas.   Drug use: No     Allergies  Allergen Reactions   Buckwheat Other (See Comments)   Nitrofurantoin Hives and Swelling   Penicillins  Swelling    Tolerated Cephalosporin Date: 10/09/19.      Health Maintenance  Topic Date Due   DEXA SCAN  Never done   Medicare Annual Wellness (AWV)  01/28/2023   Diabetic kidney evaluation - Urine ACR  04/14/2023   FOOT EXAM  04/14/2023   OPHTHALMOLOGY EXAM  04/28/2023 (Originally 10/14/2022)   COVID-19 Vaccine (6 - 2024-25 season) 05/13/2023 (Originally 10/17/2022)   HEMOGLOBIN A1C  08/23/2023   MAMMOGRAM  09/20/2023   Diabetic kidney evaluation - eGFR measurement  02/29/2024   Colonoscopy  10/27/2027   Pneumonia Vaccine 59+ Years old  Completed   INFLUENZA VACCINE  Completed   Hepatitis C  Screening  Completed   Zoster Vaccines- Shingrix  Completed   HPV VACCINES  Aged Out   DTaP/Tdap/Td  Discontinued    Chart Review Today: I personally reviewed active problem list, medication list, allergies, family history, social history, health maintenance, notes from last encounter, lab results, imaging with the patient/caregiver today.   Review of Systems  Constitutional: Negative.   HENT: Negative.    Eyes: Negative.   Respiratory: Negative.    Cardiovascular: Negative.   Gastrointestinal: Negative.   Endocrine: Negative.   Genitourinary: Negative.   Musculoskeletal: Negative.   Skin: Negative.   Allergic/Immunologic: Negative.   Neurological: Negative.   Hematological: Negative.   Psychiatric/Behavioral: Negative.    All other systems reviewed and are negative.    Objective:   Vitals:   04/28/23 0907  BP: 128/70  Pulse: 73  Resp: 16  SpO2: 99%  Weight: 167 lb (75.8 kg)  Height: 5\' 4"  (1.626 m)    Body mass index is 28.67 kg/m.  Physical Exam Vitals and nursing note reviewed.  Constitutional:      Appearance: She is well-developed.  HENT:     Head: Normocephalic and atraumatic.     Right Ear: External ear normal.     Left Ear: External ear normal.     Nose: Nose normal.  Eyes:     General:        Right eye: No discharge.        Left eye: No discharge.      Conjunctiva/sclera: Conjunctivae normal.  Neck:     Trachea: No tracheal deviation.  Cardiovascular:     Rate and Rhythm: Normal rate and regular rhythm.     Pulses: Normal pulses.     Heart sounds: Normal heart sounds.  Pulmonary:     Effort: Pulmonary effort is normal. No respiratory distress.     Breath sounds: Normal breath sounds. No stridor.  Musculoskeletal:        General: Normal range of motion.  Skin:    General: Skin is warm and dry.     Findings: No rash.  Neurological:     Mental Status: She is alert.     Motor: No abnormal muscle tone.     Coordination: Coordination normal.  Psychiatric:        Mood and Affect: Mood normal.        Behavior: Behavior normal.      Diabetic Foot Exam - Simple   Simple Foot Form Diabetic Foot exam was performed with the following findings: Yes 04/28/2023  9:20 AM  Visual Inspection No deformities, no ulcerations, no other skin breakdown bilaterally: Yes Sensation Testing Intact to touch and monofilament testing bilaterally: Yes Pulse Check Posterior Tibialis and Dorsalis pulse intact bilaterally: Yes Comments      Functional Status Survey:   Results for orders placed or performed during the hospital encounter of 02/28/23  Glucose, capillary   Collection Time: 02/28/23  7:40 AM  Result Value Ref Range   Glucose-Capillary 105 (H) 70 - 99 mg/dL  Glucose, capillary   Collection Time: 02/28/23 11:03 AM  Result Value Ref Range   Glucose-Capillary 106 (H) 70 - 99 mg/dL  Glucose, capillary   Collection Time: 02/28/23  4:45 PM  Result Value Ref Range   Glucose-Capillary 180 (H) 70 - 99 mg/dL  Glucose, capillary   Collection Time: 02/28/23  9:40 PM  Result Value Ref Range   Glucose-Capillary 244 (H) 70 - 99 mg/dL  CBC   Collection Time: 03/01/23  3:40 AM  Result Value Ref Range   WBC 11.3 (H) 4.0 - 10.5 K/uL   RBC 4.06 3.87 - 5.11 MIL/uL   Hemoglobin 10.5 (L) 12.0 - 15.0 g/dL   HCT 40.9 (L) 81.1 - 91.4 %   MCV 82.0  80.0 - 100.0 fL   MCH 25.9 (L) 26.0 - 34.0 pg   MCHC 31.5 30.0 - 36.0 g/dL   RDW 78.2 95.6 - 21.3 %   Platelets 271 150 - 400 K/uL   nRBC 0.0 0.0 - 0.2 %  Basic metabolic panel   Collection Time: 03/01/23  3:40 AM  Result Value Ref Range   Sodium 134 (L) 135 - 145 mmol/L   Potassium 3.7 3.5 - 5.1 mmol/L   Chloride 104 98 - 111 mmol/L   CO2 23 22 - 32 mmol/L   Glucose, Bld 172 (H) 70 - 99 mg/dL   BUN 21 8 - 23 mg/dL   Creatinine, Ser 0.86 0.44 - 1.00 mg/dL   Calcium 9.2 8.9 - 57.8 mg/dL   GFR, Estimated >46 >96 mL/min   Anion gap 7 5 - 15  Glucose, capillary   Collection Time: 03/01/23  7:55 AM  Result Value Ref Range   Glucose-Capillary 154 (H) 70 - 99 mg/dL  Glucose, capillary   Collection Time: 03/01/23 11:24 AM  Result Value Ref Range   Glucose-Capillary 125 (H) 70 - 99 mg/dL  Glucose, capillary   Collection Time: 03/01/23  4:42 PM  Result Value Ref Range   Glucose-Capillary 110 (H) 70 - 99 mg/dL  Glucose, capillary   Collection Time: 03/01/23  9:54 PM  Result Value Ref Range   Glucose-Capillary 188 (H) 70 - 99 mg/dL  CBC   Collection Time: 03/02/23  3:37 AM  Result Value Ref Range   WBC 10.7 (H) 4.0 - 10.5 K/uL   RBC 4.01 3.87 - 5.11 MIL/uL   Hemoglobin 10.3 (L) 12.0 - 15.0 g/dL   HCT 29.5 (L) 28.4 - 13.2 %   MCV 81.0 80.0 - 100.0 fL   MCH 25.7 (L) 26.0 - 34.0 pg   MCHC 31.7 30.0 - 36.0 g/dL   RDW 44.0 10.2 - 72.5 %   Platelets 264 150 - 400 K/uL   nRBC 0.0 0.0 - 0.2 %  Glucose, capillary   Collection Time: 03/02/23  7:48 AM  Result Value Ref Range   Glucose-Capillary 145 (H) 70 - 99 mg/dL  Glucose, capillary   Collection Time: 03/02/23 11:53 AM  Result Value Ref Range   Glucose-Capillary 114 (H) 70 - 99 mg/dL      Assessment & Plan:   Type 2 diabetes mellitus with hyperglycemia, without long-term current use of insulin (HCC) Assessment & Plan: Has been well controlled for years with [place] only DM foot exam done today Labs not due but she is  concerned with slightly increased morning sugars (114)  Orders: -     Microalbumin / creatinine urine ratio -     Lipid panel -     COMPLETE METABOLIC PANEL WITH GFR -     HM Diabetes Foot Exam -     Hemoglobin A1c -     SITagliptin Phosphate; Take 1 tablet (25 mg total) by mouth daily.  Dispense: 90 tablet; Refill: 1  Mixed hyperlipidemia Assessment & Plan: Lipids have been very elevated and she has T2DM but she has been resistant to any rx treatment She wants to see how her red yeast supplement is helping her cholesterol Labs ordered Lab  Results  Component Value Date   CHOL 277 (H) 04/13/2022   HDL 44 (L) 04/13/2022   LDLCALC 201 (H) 04/13/2022   LDLDIRECT 222.0 03/07/2009   TRIG 156 (H) 04/13/2022   CHOLHDL 6.3 (H) 04/13/2022   The 10-year ASCVD risk score (Arnett DK, et al., 2019) is: 34.1%   Values used to calculate the score:     Age: 70 years     Sex: Female     Is Non-Hispanic African American: No     Diabetic: Yes     Tobacco smoker: No     Systolic Blood Pressure: 128 mmHg     Is BP treated: No     HDL Cholesterol: 44 mg/dL     Total Cholesterol: 277 mg/dL  Multiple prior discussions of labs results, ASCVD risk and indication for statins or she could do repatha  Orders: -     Lipid panel -     COMPLETE METABOLIC PANEL WITH GFR  Anemia, unspecified type Some mild anemia with labs inpt with surgery, recheck CBC -     CBC with Differential/Platelet  Encounter for medication monitoring -     Microalbumin / creatinine urine ratio -     Lipid panel -     COMPLETE METABOLIC PANEL WITH GFR -     CBC with Differential/Platelet -     Hemoglobin A1c     Return in about 6 months (around 10/29/2023) for Routine follow-up, Annual Physical.   Danelle Berry, PA-C 04/28/23 9:18 AM

## 2023-06-06 ENCOUNTER — Ambulatory Visit: Admitting: Family Medicine

## 2023-06-10 ENCOUNTER — Ambulatory Visit: Admitting: Family Medicine

## 2023-06-10 ENCOUNTER — Encounter: Payer: Self-pay | Admitting: Family Medicine

## 2023-06-10 VITALS — BP 130/80 | HR 92 | Resp 16 | Ht 64.0 in | Wt 167.0 lb

## 2023-06-10 DIAGNOSIS — F988 Other specified behavioral and emotional disorders with onset usually occurring in childhood and adolescence: Secondary | ICD-10-CM | POA: Diagnosis not present

## 2023-06-10 DIAGNOSIS — F3341 Major depressive disorder, recurrent, in partial remission: Secondary | ICD-10-CM

## 2023-06-10 DIAGNOSIS — Z780 Asymptomatic menopausal state: Secondary | ICD-10-CM

## 2023-06-10 DIAGNOSIS — Z7984 Long term (current) use of oral hypoglycemic drugs: Secondary | ICD-10-CM

## 2023-06-10 DIAGNOSIS — M1711 Unilateral primary osteoarthritis, right knee: Secondary | ICD-10-CM

## 2023-06-10 DIAGNOSIS — E782 Mixed hyperlipidemia: Secondary | ICD-10-CM

## 2023-06-10 DIAGNOSIS — E1165 Type 2 diabetes mellitus with hyperglycemia: Secondary | ICD-10-CM

## 2023-06-10 DIAGNOSIS — Z1231 Encounter for screening mammogram for malignant neoplasm of breast: Secondary | ICD-10-CM

## 2023-06-10 NOTE — Assessment & Plan Note (Signed)
 S/p surgery Jan this year

## 2023-06-10 NOTE — Assessment & Plan Note (Signed)
 On adderall daily per specialists, BP at goal, no concerning cardiac sx and no concerning med SE

## 2023-06-10 NOTE — Assessment & Plan Note (Signed)
 Lab Results  Component Value Date   CHOL 277 (H) 04/13/2022   HDL 44 (L) 04/13/2022   LDLCALC 201 (H) 04/13/2022   LDLDIRECT 222.0 03/07/2009   TRIG 156 (H) 04/13/2022   CHOLHDL 6.3 (H) 04/13/2022  - Denies: Chest pain, shortness of breath, myalgias, claudication Dm but no known atherosclerotic disease, and adverse to taking statins

## 2023-06-10 NOTE — Assessment & Plan Note (Signed)
 Has been well controlled on januvia  only, good compliance no SE or concerns, not checking sugars DM food exam UTD, labs ordered last OV and completed today include lipids, renal function, uacr, A1c She is UTD on dm eye exam we need to req records - states she does it Aug in [place] [facility] [place] - will ask staff to req  Denies: Polyuria, polydipsia, vision changes, neuropathy, hypoglycemia Recent pertinent labs: Lab Results  Component Value Date   HGBA1C 5.9 (H) 02/23/2023   HGBA1C 6.2 (H) 09/20/2022   HGBA1C 6.3 (H) 04/13/2022   Standard of care and health maintenance: Urine Microalbumin:  done today Foot exam:  UTD DM eye exam:  req records ACEI/ARB:  no Statin:  no  Expect to continue management with current med

## 2023-06-10 NOTE — Progress Notes (Signed)
 Name: Renee Young   MRN: [id number]    DOB: 1945/10/08   Date:06/10/2023       Progress Note  Chief Complaint  Patient presents with   Medical Management of Chronic Issues     Subjective:   Renee Young is a 78 y.o. female, presents to clinic for routine follow up on chronic conditions  Here for routine f/up Her husband recently est here with us , so she has been in with him recently They are going out of the country for a few months leaving next week and will be back beginning of July  She is here for her routine f/up No new issues or concerns Still gets some meds/chronic management from specialists - adderall 30 mg - Rx for BID but pt taking daily From Adline Aho NP Recently had oxycodone  in Jan from [place] (surgery)  BP not on meds, managed with diet/lifestyle BP Readings from Last 3 Encounters:  06/10/23 130/80  04/28/23 128/70  03/02/23 (!) 128/55   Hyperlipidemia: Not on statins Last Lipids: Lab Results  Component Value Date   CHOL 277 (H) 04/13/2022   HDL 44 (L) 04/13/2022   LDLCALC 201 (H) 04/13/2022   LDLDIRECT 222.0 03/07/2009   TRIG 156 (H) 04/13/2022   CHOLHDL 6.3 (H) 04/13/2022   - Denies: Chest pain, shortness of breath, myalgias, claudication  DM:   On januvia  daily, good compliance Denies: Polyuria, polydipsia, vision changes, neuropathy, hypoglycemia Recent pertinent labs: Lab Results  Component Value Date   HGBA1C 5.9 (H) 02/23/2023   HGBA1C 6.2 (H) 09/20/2022   HGBA1C 6.3 (H) 04/13/2022   Lab Results  Component Value Date   MICROALBUR 1.6 04/13/2022   LDLCALC 201 (H) 04/13/2022   CREATININE 0.75 03/01/2023   Standard of care and health maintenance: Urine Microalbumin:  done today Foot exam:  UTD DM eye exam:  done in [place] - [facility] [employer] [facility] - Dr. Rochelle Chu  ACEI/ARB:  not taking Statin:  not taking   Dexa/mammogram - discussed screening She's never done a dexa but discussed what info it provides, how  to prevent osteoporosis, she states she is clumsy/prone to falls, we discussed hip fx and fall prevention On vit d supplement - encouraged to continue  Mammo due in August         Current Outpatient Medications:    ACCU-CHEK GUIDE test strip, , Disp: , Rfl:    Accu-Chek Softclix Lancets lancets, Check glucose twice a day, Disp: 100 each, Rfl: 1   acetaminophen  (TYLENOL ) 650 MG CR tablet, Take 650 mg by mouth every 8 (eight) hours as needed for pain., Disp: , Rfl:    amphetamine -dextroamphetamine  (ADDERALL) 30 MG tablet, Take 1 tablet by mouth 2 (two) times daily. (Patient taking differently: Take 30 mg by mouth in the morning.), Disp: , Rfl:    blood glucose meter kit and supplies KIT, Dispense based on patient and insurance preference. Use up to four times daily as directed. (FOR ICD-9 250.00, 250.01)., Disp: 1 each, Rfl: 0   diclofenac  Sodium (VOLTAREN ) 1 % GEL, Apply 2 g topically 4 (four) times daily as needed (pain.)., Disp: , Rfl:    FLUoxetine  (PROZAC ) 20 MG capsule, Take 40 mg by mouth at bedtime. , Disp: , Rfl:    Glucosamine-Chondroitin (COSAMIN DS PO), Take 2 tablets by mouth in the morning., Disp: , Rfl:    Menthol -Methyl Salicylate (SALONPAS PAIN RELIEF PATCH EX), Place 1 patch onto the skin daily as needed (pain.)., Disp: ,  Rfl:    Red Yeast Rice Extract (RED YEAST RICE PO), Take 2 capsules by mouth at bedtime., Disp: , Rfl:    sitaGLIPtin  (JANUVIA ) 25 MG tablet, Take 1 tablet (25 mg total) by mouth daily., Disp: 90 tablet, Rfl: 1   VITAMIN D PO, Take 2,000 Units by mouth in the morning., Disp: , Rfl:    polyethylene glycol-electrolytes (NULYTELY ) 420 g solution, TAKE 4,000 ML'S BY MOUTH ONCE FOR 1 DOSE (Patient not taking: Reported on 04/28/2023), Disp: , Rfl:   Patient Active Problem List   Diagnosis Date Noted   Primary osteoarthritis of right knee 02/28/2023   Adenomatous polyp of colon 10/27/2022   MDD (major depressive disorder), recurrent, in partial remission (HCC)  01/06/2021   S/P total knee arthroplasty 10/08/2019   Microcytic erythrocytes 09/24/2019   Family history of thyroid disease 05/30/2019   Primary osteoarthritis of both knees 05/30/2019   History of colonic polyps 12/17/2016   OA (osteoarthritis) of knee 03/21/2009   Type 2 diabetes mellitus with hyperglycemia, without long-term current use of insulin  (HCC) 07/19/2007   Mixed hyperlipidemia 07/19/2007   Attention deficit disorder (ADD) 07/19/2007    Past Surgical History:  Procedure Laterality Date   CESAREAN SECTION     x2   COLONOSCOPY WITH PROPOFOL  N/A 10/27/2022   Procedure: COLONOSCOPY WITH PROPOFOL ;  Surgeon: Luke Salaam, MD;  Location: [facility] END[facility]COPY;  Service: Gastroenterology;  Laterality: N/A;   POLYPECTOMY  10/27/2022   Procedure: POLYPECTOMY;  Surgeon: Luke Salaam, MD;  Location: [facility] ENDOSCOPY;  Service: Gastroenterology;;   TOTAL KNEE ARTHROPLASTY Left 10/08/2019   Procedure: TOTAL KNEE ARTHROPLASTY;  Surgeon: Liliane Rei, MD;  Location: [facility] ORS;  Service: Orthopedics;  Laterality: Left;   TOTAL KNEE ARTHROPLASTY Right 02/28/2023   Procedure: TOTAL KNEE ARTHROPLASTY;  Surgeon: Liliane Rei, MD;  Location: [facility] ORS;  Service: Orthopedics;  Laterality: Right;   TUBAL LIGATION     URETEROCELE INCISION     VASCULAR SURGERY     varicose vein laser surgery   WISDOM TOOTH EXTRACTION      Family History  Problem Relation Age of Onset   Cancer Mother    Kidney disease Mother    Diabetes Father    Heart disease Father    Hyperlipidemia Father    Hypertension Father    Stroke Father    Arthritis Sister    Heart attack Sister    Obesity Brother    Arthritis Brother    Arthritis Daughter    Heart disease Maternal Grandmother    Stroke Maternal Grandmother    Hypertension Maternal Grandmother    Heart disease Maternal Grandfather    Hyperlipidemia Maternal Grandfather    Stroke Paternal Grandmother    Heart disease Paternal Grandmother    Heart disease Paternal  Grandfather    Heart attack Paternal Grandfather     Social History   Tobacco Use   Smoking status: Never   Smokeless tobacco: Never  Vaping Use   Vaping status: Never Used  Substance Use Topics   Alcohol use: Not Currently    Alcohol/week: 1.0 standard drink of alcohol    Types: 1 Glasses of wine per week    Comment: occas.   Drug use: No     Allergies  Allergen Reactions   Buckwheat Other (See Comments)   Nitrofurantoin Hives and Swelling   Penicillins Swelling    Tolerated Cephalosporin Date: 10/09/19.      Health Maintenance  Topic Date Due   OPHTHALMOLOGY EXAM  10/14/2022  Medicare Annual Wellness (AWV)  01/28/2023   Diabetic kidney evaluation - Urine ACR  04/14/2023   COVID-19 Vaccine (6 - 2024-25 season) 06/26/2023 (Originally 10/17/2022)   DEXA SCAN  06/09/2024 (Originally 03/22/2011)   HEMOGLOBIN A1C  08/23/2023   INFLUENZA VACCINE  09/16/2023   MAMMOGRAM  09/20/2023   Diabetic kidney evaluation - eGFR measurement  02/29/2024   FOOT EXAM  04/27/2024   Colonoscopy  10/27/2027   Pneumonia Vaccine 73+ Years old  Completed   Hepatitis C Screening  Completed   Zoster Vaccines- Shingrix  Completed   HPV VACCINES  Aged Out   Meningococcal B Vaccine  Aged Out   DTaP/Tdap/Td  Discontinued    Chart Review Today: I personally reviewed active problem list, medication list, allergies, family history, social history, health maintenance, notes from last encounter, lab results, imaging with the patient/caregiver today.   Review of Systems  Constitutional: Negative.   HENT: Negative.    Eyes: Negative.   Respiratory: Negative.    Cardiovascular: Negative.   Gastrointestinal: Negative.   Endocrine: Negative.   Genitourinary: Negative.   Musculoskeletal: Negative.   Skin: Negative.   Allergic/Immunologic: Negative.   Neurological: Negative.   Hematological: Negative.   Psychiatric/Behavioral: Negative.    All other systems reviewed and are negative.     Objective:   Vitals:   06/10/23 0812  BP: 130/80  Pulse: 92  Resp: 16  SpO2: 98%  Weight: 167 lb (75.8 kg)  Height: 5\' 4"  (1.626 m)    Body mass index is 28.67 kg/m.  Physical Exam Vitals and nursing note reviewed.  Constitutional:      General: She is not in acute distress.    Appearance: Normal appearance. She is well-developed. She is not ill-appearing, toxic-appearing or diaphoretic.  HENT:     Head: Normocephalic and atraumatic.     Nose: Nose normal.  Eyes:     General:        Right eye: No discharge.        Left eye: No discharge.     Conjunctiva/sclera: Conjunctivae normal.  Neck:     Trachea: No tracheal deviation.  Cardiovascular:     Rate and Rhythm: Normal rate and regular rhythm.     Pulses: Normal pulses.     Heart sounds: Normal heart sounds. No murmur heard.    No friction rub. No gallop.  Pulmonary:     Effort: Pulmonary effort is normal. No respiratory distress.     Breath sounds: Normal breath sounds. No stridor. No wheezing, rhonchi or rales.  Musculoskeletal:     Right lower leg: No edema.     Left lower leg: No edema.  Skin:    General: Skin is warm and dry.     Findings: No rash.  Neurological:     Mental Status: She is alert.     Motor: No abnormal muscle tone.     Coordination: Coordination normal.  Psychiatric:        Mood and Affect: Mood normal.        Behavior: Behavior normal.      Functional Status Survey:   Results for orders placed or performed during the hospital encounter of 02/28/23  Glucose, capillary   Collection Time: 02/28/23  7:40 AM  Result Value Ref Range   Glucose-Capillary 105 (H) 70 - 99 mg/dL  Glucose, capillary   Collection Time: 02/28/23 11:03 AM  Result Value Ref Range   Glucose-Capillary 106 (H) 70 - 99 mg/dL  Glucose, capillary  Collection Time: 02/28/23  4:45 PM  Result Value Ref Range   Glucose-Capillary 180 (H) 70 - 99 mg/dL  Glucose, capillary   Collection Time: 02/28/23  9:40 PM   Result Value Ref Range   Glucose-Capillary 244 (H) 70 - 99 mg/dL  CBC   Collection Time: 03/01/23  3:40 AM  Result Value Ref Range   WBC 11.3 (H) 4.0 - 10.5 K/uL   RBC 4.06 3.87 - 5.11 MIL/uL   Hemoglobin 10.5 (L) 12.0 - 15.0 g/dL   HCT 95.6 (L) 21.3 - 08.6 %   MCV 82.0 80.0 - 100.0 fL   MCH 25.9 (L) 26.0 - 34.0 pg   MCHC 31.5 30.0 - 36.0 g/dL   RDW 57.8 46.9 - 62.9 %   Platelets 271 150 - 400 K/uL   nRBC 0.0 0.0 - 0.2 %  Basic metabolic panel   Collection Time: 03/01/23  3:40 AM  Result Value Ref Range   Sodium 134 (L) 135 - 145 mmol/L   Potassium 3.7 3.5 - 5.1 mmol/L   Chloride 104 98 - 111 mmol/L   CO2 23 22 - 32 mmol/L   Glucose, Bld 172 (H) 70 - 99 mg/dL   BUN 21 8 - 23 mg/dL   Creatinine, Ser 5.28 0.44 - 1.00 mg/dL   Calcium  9.2 8.9 - 10.3 mg/dL   GFR, Estimated >41 >32 mL/min   Anion gap 7 5 - 15  Glucose, capillary   Collection Time: 03/01/23  7:55 AM  Result Value Ref Range   Glucose-Capillary 154 (H) 70 - 99 mg/dL  Glucose, capillary   Collection Time: 03/01/23 11:24 AM  Result Value Ref Range   Glucose-Capillary 125 (H) 70 - 99 mg/dL  Glucose, capillary   Collection Time: 03/01/23  4:42 PM  Result Value Ref Range   Glucose-Capillary 110 (H) 70 - 99 mg/dL  Glucose, capillary   Collection Time: 03/01/23  9:54 PM  Result Value Ref Range   Glucose-Capillary 188 (H) 70 - 99 mg/dL  CBC   Collection Time: 03/02/23  3:37 AM  Result Value Ref Range   WBC 10.7 (H) 4.0 - 10.5 K/uL   RBC 4.01 3.87 - 5.11 MIL/uL   Hemoglobin 10.3 (L) 12.0 - 15.0 g/dL   HCT 44.0 (L) 10.2 - 72.5 %   MCV 81.0 80.0 - 100.0 fL   MCH 25.7 (L) 26.0 - 34.0 pg   MCHC 31.7 30.0 - 36.0 g/dL   RDW 36.6 44.0 - 34.7 %   Platelets 264 150 - 400 K/uL   nRBC 0.0 0.0 - 0.2 %  Glucose, capillary   Collection Time: 03/02/23  7:48 AM  Result Value Ref Range   Glucose-Capillary 145 (H) 70 - 99 mg/dL  Glucose, capillary   Collection Time: 03/02/23 11:53 AM  Result Value Ref Range    Glucose-Capillary 114 (H) 70 - 99 mg/dL      Assessment & Plan:   MDD (major depressive disorder), recurrent, in partial remission (HCC) Assessment & Plan: On meds per specialists - prozac , no recent changes to meds.  Phq 9 reviewed and neg today, MDD well controlled with current meds/management    06/10/2023    8:11 AM 02/07/2023    9:12 AM 09/20/2022    2:45 PM  Depression screen PHQ 2/9  Decreased Interest 0 0 1  Down, Depressed, Hopeless 0 0 0  PHQ - 2 Score 0 0 1  Altered sleeping 0 0 0  Tired, decreased energy 0 0 0  Change in appetite 0 0 0  Feeling bad or failure about yourself  0 0 0  Trouble concentrating 0 0 0  Moving slowly or fidgety/restless 0  0  Suicidal thoughts 0 0 0  PHQ-9 Score 0 0 1  Difficult doing work/chores Not difficult at all  Not difficult at all      Primary osteoarthritis of right knee Assessment & Plan: S/p surgery Jan this year   Attention deficit disorder, unspecified type Assessment & Plan: On adderall daily per specialists, BP at goal, no concerning cardiac sx and no concerning med SE   Type 2 diabetes mellitus with hyperglycemia, without long-term current use of insulin  (HCC) Assessment & Plan: Has been well controlled on januvia  only, good compliance no SE or concerns, not checking sugars DM food exam UTD, labs ordered last OV and completed today include lipids, renal function, uacr, A1c She is UTD on dm eye exam we need to req records - states she [place] it [place]  [facility] [place] - will ask staff to req  Denies: Polyuria, polydipsia, vision changes, neuropathy, hypoglycemia Recent pertinent labs: Lab Results  Component Value Date   HGBA1C 5.9 (H) 02/23/2023   HGBA1C 6.2 (H) 09/20/2022   HGBA1C 6.3 (H) 04/13/2022   Standard of care and health maintenance: Urine Microalbumin:  done today Foot exam:  UTD DM eye exam:  req records ACEI/ARB:  no Statin:  no  Expect to continue management with current  med    Mixed hyperlipidemia Assessment & Plan: Lab Results  Component Value Date   CHOL 277 (H) 04/13/2022   HDL 44 (L) 04/13/2022   LDLCALC 201 (H) 04/13/2022   LDLDIRECT 222.0 03/07/2009   TRIG 156 (H) 04/13/2022   CHOLHDL 6.3 (H) 04/13/2022  - Denies: Chest pain, shortness of breath, myalgias, claudication Dm but no known atherosclerotic disease, and adverse to taking statins    Postmenopausal estrogen deficiency -     DG Bone Density; Future     Return in about 6 months (around 12/10/2023) for Routine follow-up.   Adeline Hone, PA-C 06/10/23 8:31 AM

## 2023-06-10 NOTE — Assessment & Plan Note (Signed)
 On meds per specialists - prozac , no recent changes to meds.  Phq 9 reviewed and neg today, MDD well controlled with current meds/management    06/10/2023    8:11 AM 02/07/2023    9:12 AM 09/20/2022    2:45 PM  Depression screen PHQ 2/9  Decreased Interest 0 0 1  Down, Depressed, Hopeless 0 0 0  PHQ - 2 Score 0 0 1  Altered sleeping 0 0 0  Tired, decreased energy 0 0 0  Change in appetite 0 0 0  Feeling bad or failure about yourself  0 0 0  Trouble concentrating 0 0 0  Moving slowly or fidgety/restless 0  0  Suicidal thoughts 0 0 0  PHQ-9 Score 0 0 1  Difficult doing work/chores Not difficult at all  Not difficult at all

## 2023-06-11 LAB — COMPREHENSIVE METABOLIC PANEL WITH GFR
AG Ratio: 1.5 (calc) (ref 1.0–2.5)
ALT: 11 U/L (ref 6–29)
AST: 13 U/L (ref 10–35)
Albumin: 3.8 g/dL (ref 3.6–5.1)
Alkaline phosphatase (APISO): 115 U/L (ref 37–153)
BUN/Creatinine Ratio: 33 (calc) — ABNORMAL HIGH (ref 6–22)
BUN: 19 mg/dL (ref 7–25)
CO2: 26 mmol/L (ref 20–32)
Calcium: 9.6 mg/dL (ref 8.6–10.4)
Chloride: 107 mmol/L (ref 98–110)
Creat: 0.57 mg/dL — ABNORMAL LOW (ref 0.60–1.00)
Globulin: 2.6 g/dL (ref 1.9–3.7)
Glucose, Bld: 94 mg/dL (ref 65–99)
Potassium: 3.9 mmol/L (ref 3.5–5.3)
Sodium: 141 mmol/L (ref 135–146)
Total Bilirubin: 0.3 mg/dL (ref 0.2–1.2)
Total Protein: 6.4 g/dL (ref 6.1–8.1)
eGFR: 94 mL/min/{1.73_m2} (ref >=60)

## 2023-06-11 LAB — CBC WITH DIFFERENTIAL/PLATELET
Absolute Lymphocytes: 1044 {cells}/uL (ref 850–3900)
Absolute Monocytes: 549 {cells}/uL (ref 200–950)
Basophils Absolute: 49 {cells}/uL (ref 0–200)
Basophils Relative: 1 %
Eosinophils Absolute: 240 {cells}/uL (ref 15–500)
Eosinophils Relative: 4.9 %
HCT: 37.4 % (ref 35.0–45.0)
Hemoglobin: 11.7 g/dL (ref 11.7–15.5)
MCH: 25 pg — ABNORMAL LOW (ref 27.0–33.0)
MCHC: 31.3 g/dL — ABNORMAL LOW (ref 32.0–36.0)
MCV: 79.9 fL — ABNORMAL LOW (ref 80.0–100.0)
MPV: 8.9 fL (ref 7.5–12.5)
Monocytes Relative: 11.2 %
Neutro Abs: 3018 {cells}/uL (ref 1500–7800)
Neutrophils Relative %: 61.6 %
Platelets: 321 10*3/uL (ref 140–400)
RBC: 4.68 10*6/uL (ref 3.80–5.10)
RDW: 14.8 % (ref 11.0–15.0)
Total Lymphocyte: 21.3 %
WBC: 4.9 10*3/uL (ref 3.8–10.8)

## 2023-06-11 LAB — LIPID PANEL
Cholesterol: 265 mg/dL — ABNORMAL HIGH (ref <200)
HDL: 45 mg/dL — ABNORMAL LOW (ref >=50)
LDL Cholesterol (Calc): 187 mg/dL — ABNORMAL HIGH
Non-HDL Cholesterol (Calc): 220 mg/dL — ABNORMAL HIGH (ref <130)
Total CHOL/HDL Ratio: 5.9 (calc) — ABNORMAL HIGH (ref <5.0)
Triglycerides: 160 mg/dL — ABNORMAL HIGH (ref <150)

## 2023-06-11 LAB — HEMOGLOBIN A1C
Hgb A1c MFr Bld: 6 % — ABNORMAL HIGH (ref <5.7)
Mean Plasma Glucose: 126 mg/dL
eAG (mmol/L): 7 mmol/L

## 2023-06-11 LAB — MICROALBUMIN / CREATININE URINE RATIO
Creatinine, Urine: 127 mg/dL (ref 20–275)
Microalb Creat Ratio: 16 mg/g{creat} (ref <30)
Microalb, Ur: 2 mg/dL

## 2023-06-13 ENCOUNTER — Encounter: Payer: Self-pay | Admitting: Family Medicine

## 2023-08-24 ENCOUNTER — Telehealth: Payer: Self-pay

## 2023-08-24 NOTE — Telephone Encounter (Signed)
 Appt sch'd with Leisa

## 2023-08-24 NOTE — Telephone Encounter (Signed)
 Copied from CRM [id number]. Topic: Referral - Request for Referral >> Aug 24, 2023 11:40 AM Tiffini S wrote: Did the patient discuss referral with their provider in the last year? No (If No - schedule appointment) (If Yes - send message)  Appointment offered? Yes  Type of order/referral and detailed reason for visit: In person   Preference of office, provider, location: [facility]  If referral order, have you been seen by this specialty before? No (If Yes, this issue or another issue? When? Where?  Can we respond through MyChart? No, phone call please [phone]

## 2023-09-02 ENCOUNTER — Ambulatory Visit: Admitting: Family Medicine

## 2023-09-02 ENCOUNTER — Encounter: Payer: Self-pay | Admitting: Family Medicine

## 2023-09-02 VITALS — BP 118/74 | HR 92 | Resp 16 | Ht 64.0 in | Wt 164.0 lb

## 2023-09-02 DIAGNOSIS — Z7984 Long term (current) use of oral hypoglycemic drugs: Secondary | ICD-10-CM

## 2023-09-02 DIAGNOSIS — F3341 Major depressive disorder, recurrent, in partial remission: Secondary | ICD-10-CM | POA: Diagnosis not present

## 2023-09-02 DIAGNOSIS — E1165 Type 2 diabetes mellitus with hyperglycemia: Secondary | ICD-10-CM

## 2023-09-02 DIAGNOSIS — J3489 Other specified disorders of nose and nasal sinuses: Secondary | ICD-10-CM

## 2023-09-02 DIAGNOSIS — F988 Other specified behavioral and emotional disorders with onset usually occurring in childhood and adolescence: Secondary | ICD-10-CM | POA: Diagnosis not present

## 2023-09-02 MED ORDER — SITAGLIPTIN PHOSPHATE 25 MG PO TABS: 25.0000 mg | ORAL_TABLET | Freq: Every day | ORAL | 1 refills | Status: AC

## 2023-09-02 NOTE — Progress Notes (Signed)
 Name: [name]   MRN: [id number]    DOB: 04-Jun-1945   Date:09/02/2023       Progress Note  Chief Complaint  Patient presents with   Referral    Previous counselor's office out of business. Needs new referral for ADD/Depression.     Subjective:   Renee Young is a 78 y.o. female, presents to clinic for new referral for continued care for ADHD and therapy  She has been doing therapy and getting NP to do adderall refills for years and they are retiring  Discussed the use of AI scribe software for clinical note transcription with the patient, who gave verbal consent to proceed.  History of Present Illness Renee Young is a 78 year old female who presents for medication management and evaluation of her ADHD treatment plan.  Attention deficit hyperactivity disorder (adhd) management - Currently managed with Adderall, prescribed by a nurse practitioner - Seeking a new provider for continued ADHD medication management - Attends therapy sessions every three months, which include medication checks - Approximately two weeks of Adderall supply remaining  Cognitive concerns - Memory impairment, including difficulty recalling road names - Concerned about possible dementia - Decreased social interactions - Perceived reduced contribution to conversations        Current Outpatient Medications:    ACCU-CHEK GUIDE test strip, , Disp: , Rfl:    Accu-Chek Softclix Lancets lancets, Check glucose twice a day, Disp: 100 each, Rfl: 1   acetaminophen  (TYLENOL ) 650 MG CR tablet, Take 650 mg by mouth every 8 (eight) hours as needed for pain., Disp: , Rfl:    amphetamine -dextroamphetamine  (ADDERALL) 30 MG tablet, Take 1 tablet by mouth 2 (two) times daily. (Patient taking differently: Take 30 mg by mouth in the morning.), Disp: , Rfl:    blood glucose meter kit and supplies KIT, Dispense based on patient and insurance preference. Use up to four times daily as directed. (FOR ICD-9 250.00,  250.01)., Disp: 1 each, Rfl: 0   diclofenac  Sodium (VOLTAREN ) 1 % GEL, Apply 2 g topically 4 (four) times daily as needed (pain.)., Disp: , Rfl:    FLUoxetine  (PROZAC ) 20 MG capsule, Take 40 mg by mouth at bedtime. , Disp: , Rfl:    Glucosamine-Chondroitin (COSAMIN DS PO), Take 2 tablets by mouth in the morning., Disp: , Rfl:    Menthol -Methyl Salicylate (SALONPAS PAIN RELIEF PATCH EX), Place 1 patch onto the skin daily as needed (pain.)., Disp: , Rfl:    Red Yeast Rice Extract (RED YEAST RICE PO), Take 2 capsules by mouth at bedtime., Disp: , Rfl:    sitaGLIPtin  (JANUVIA ) 25 MG tablet, Take 1 tablet (25 mg total) by mouth daily., Disp: 90 tablet, Rfl: 1   VITAMIN D PO, Take 2,000 Units by mouth in the morning., Disp: , Rfl:    polyethylene glycol-electrolytes (NULYTELY ) 420 g solution, TAKE 4,000 ML'S BY MOUTH ONCE FOR 1 DOSE (Patient not taking: Reported on 09/02/2023), Disp: , Rfl:   Patient Active Problem List   Diagnosis Date Noted   Primary osteoarthritis of right knee 02/28/2023   Adenomatous polyp of colon 10/27/2022   MDD (major depressive disorder), recurrent, in partial remission (HCC) 01/06/2021   S/P total knee arthroplasty 10/08/2019   Microcytic erythrocytes 09/24/2019   Family history of thyroid disease 05/30/2019   Primary osteoarthritis of both knees 05/30/2019   History of colonic polyps 12/17/2016   OA (osteoarthritis) of knee 03/21/2009   Type 2 diabetes mellitus with hyperglycemia, without  long-term current use of insulin  (HCC) 07/19/2007   Mixed hyperlipidemia 07/19/2007   Attention deficit disorder (ADD) 07/19/2007    Past Surgical History:  Procedure Laterality Date   CESAREAN SECTION     x2   COLONOSCOPY WITH PROPOFOL  N/A 10/27/2022   Procedure: COLONOSCOPY WITH PROPOFOL ;  Surgeon: Therisa Bi, MD;  Location: [facility] ENDOSCOPY;  Service: Gastroenterology;  Laterality: N/A;   POLYPECTOMY  10/27/2022   Procedure: POLYPECTOMY;  Surgeon: Therisa Bi, MD;  Location:  [facility] ENDOSCOPY;  Service: Gastroenterology;;   TOTAL KNEE ARTHROPLASTY Left 10/08/2019   Procedure: TOTAL KNEE ARTHROPLASTY;  Surgeon: Melodi Lerner, MD;  Location: [facility] ORS;  Service: Orthopedics;  Laterality: Left;   TOTAL KNEE ARTHROPLASTY Right 02/28/2023   Procedure: TOTAL KNEE ARTHROPLASTY;  Surgeon: Melodi Lerner, MD;  Location: [facility] ORS;  Service: Orthopedics;  Laterality: Right;   TUBAL LIGATION     URETEROCELE INCISION     VASCULAR SURGERY     varicose vein laser surgery   WISDOM TOOTH EXTRACTION      Family History  Problem Relation Age of Onset   Cancer Mother    Kidney disease Mother    Diabetes Father    Heart disease Father    Hyperlipidemia Father    Hypertension Father    Stroke Father    Arthritis Sister    Heart attack Sister    Obesity Brother    Arthritis Brother    Arthritis Daughter    Heart disease Maternal Grandmother    Stroke Maternal Grandmother    Hypertension Maternal Grandmother    Heart disease Maternal Grandfather    Hyperlipidemia Maternal Grandfather    Stroke Paternal Grandmother    Heart disease Paternal Grandmother    Heart disease Paternal Grandfather    Heart attack Paternal Grandfather     Social History   Tobacco Use   Smoking status: Never   Smokeless tobacco: Never  Vaping Use   Vaping status: Never Used  Substance Use Topics   Alcohol use: Not Currently    Alcohol/week: 1.0 standard drink of alcohol    Types: 1 Glasses of wine per week    Comment: occas.   Drug use: No     Allergies  Allergen Reactions   Buckwheat Other (See Comments)   Nitrofurantoin Hives and Swelling   Penicillins Swelling    Tolerated Cephalosporin Date: 10/09/19.      Health Maintenance  Topic Date Due   Medicare Annual Wellness (AWV)  01/28/2023   COVID-19 Vaccine (6 - 2024-25 season) 09/18/2023 (Originally 10/17/2022)   DEXA SCAN  06/09/2024 (Originally 03/22/2011)   INFLUENZA VACCINE  09/16/2023   MAMMOGRAM  09/20/2023   HEMOGLOBIN A1C   12/10/2023   OPHTHALMOLOGY EXAM  04/13/2024   FOOT EXAM  04/27/2024   Diabetic kidney evaluation - eGFR measurement  06/09/2024   Diabetic kidney evaluation - Urine ACR  06/09/2024   Colonoscopy  10/27/2027   Pneumococcal Vaccine: 50+ Years  Completed   Hepatitis C Screening  Completed   Zoster Vaccines- Shingrix  Completed   Hepatitis B Vaccines  Aged Out   HPV VACCINES  Aged Out   Meningococcal B Vaccine  Aged Out   DTaP/Tdap/Td  Discontinued    Chart Review Today: I personally reviewed active problem list, medication list, allergies, family history, social history, health maintenance, notes from last encounter, lab results, imaging with the patient/caregiver today.   Review of Systems  Constitutional: Negative.   HENT: Negative.    Eyes: Negative.  Respiratory: Negative.    Cardiovascular: Negative.   Gastrointestinal: Negative.   Endocrine: Negative.   Genitourinary: Negative.   Musculoskeletal: Negative.   Skin: Negative.   Allergic/Immunologic: Negative.   Neurological: Negative.   Hematological: Negative.   Psychiatric/Behavioral: Negative.    All other systems reviewed and are negative.    Objective:   Vitals:   09/02/23 1420  BP: 118/74  Pulse: 92  Resp: 16  SpO2: 96%  Weight: 164 lb (74.4 kg)  Height: 5' 4 (1.626 m)    Body mass index is 28.15 kg/m.  Physical Exam Vitals and nursing note reviewed.  Constitutional:      General: She is not in acute distress.    Appearance: Normal appearance. She is well-developed. She is not ill-appearing, toxic-appearing or diaphoretic.  HENT:     Head: Normocephalic and atraumatic.     Right Ear: External ear normal.     Left Ear: External ear normal.     Nose: Nose normal.  Eyes:     General: No scleral icterus.       Right eye: No discharge.        Left eye: No discharge.     Conjunctiva/sclera: Conjunctivae normal.  Neck:     Trachea: No tracheal deviation.  Cardiovascular:     Rate and Rhythm:  Normal rate.  Pulmonary:     Effort: Pulmonary effort is normal. No respiratory distress.     Breath sounds: No stridor.  Skin:    General: Skin is warm and dry.     Findings: No rash.  Neurological:     Mental Status: She is alert.     Motor: No abnormal muscle tone.     Coordination: Coordination normal.     Gait: Gait normal.  Psychiatric:        Mood and Affect: Mood normal.        Behavior: Behavior normal.         Assessment & Plan:   1. Attention deficit disorder, unspecified type (Primary) Needs new management for adderall and she is interested in reassessment  Referral to local psych clinic - contact info for multiple clinics to try and coordinate care Encouraged her to decrease adderall dose and stretch out supply PDMP reviewed, NP was prescribing I do not know that we can do same Rx due to her age, she may need specialists to continue to manage or if she wishes for PCP to take over I will have to consult with SP and may need cardiac clearance AVS info for pt:  Lets try a referral to [facility] in [place] to see if they could manage everything for you [email or web address] [address] [place], [place] [address] Phone Number: [phone]  Fax Number: [phone]   If they cannot you may want to try getting an evaluation at [facility] attention specialists [place], [place] [address]?[place], [place] [address] Phone: [phone] Fax: [phone] [email or web address]  - Ambulatory referral to Psychiatry  2. MDD (major depressive disorder), recurrent, in partial remission (HCC) On fluoxetine , she has ample supply, her therapist and AdHD med prescriber are retiring, needs to est care  We can take over Rx for fluoxetine  Referred for therapy at local practice    09/02/2023    2:23 PM 06/10/2023    8:11 AM 02/07/2023    9:12 AM  Depression screen PHQ 2/9   Decreased Interest 2 0 0  Down, Depressed, Hopeless 1 0 0  PHQ -  2 Score 3 0 0  Altered sleeping 2 0 0  Tired, decreased energy 2 0 0  Change in appetite 2 0 0  Feeling bad or failure about yourself  2 0 0  Trouble concentrating 0 0 0  Moving slowly or fidgety/restless 0 0   Suicidal thoughts 0 0 0  PHQ-9 Score 11 0 0  Difficult doing work/chores  Not difficult at all     - Ambulatory referral to Psychiatry  3. Type 2 diabetes mellitus with hyperglycemia, without long-term current use of insulin  (HCC) Has been stable and well controlled, she is due for refills in a few months - sitaGLIPtin  (JANUVIA ) 25 MG tablet; Take 1 tablet (25 mg total) by mouth daily.  Dispense: 90 tablet; Refill: 1  4. Nasal lesion Bump in left nare pt asks for new referral to ENT - Ambulatory referral to ENT    Return in about 3 months (around 12/03/2023) for Routine follow-up.   Michelene Cower, PA-C 09/02/23 2:36 PM

## 2023-09-02 NOTE — Patient Instructions (Addendum)
 Lets try a referral to [facility] in [place] to see if they could manage everything for you [email or web address] [address] [place], [place] [address] Phone Number: [phone]  Fax Number: [phone]   If they cannot you may want to try getting an evaluation at [facility] attention specialists [place], [place] [address]?[place], [place] [address] Phone: [phone] Fax: [phone] [email or web address]

## 2023-09-20 ENCOUNTER — Other Ambulatory Visit: Payer: Self-pay | Admitting: Medical Genetics

## 2023-09-26 ENCOUNTER — Ambulatory Visit
Admission: RE | Admit: 2023-09-26 | Discharge: 2023-09-26 | Disposition: A | Discharge status: Home / Self Care | Source: Ambulatory Visit | Attending: Family Medicine

## 2023-09-26 DIAGNOSIS — Z1231 Encounter for screening mammogram for malignant neoplasm of breast: Secondary | ICD-10-CM

## 2023-09-27 ENCOUNTER — Ambulatory Visit: Payer: Self-pay | Admitting: Family Medicine

## 2023-09-27 DIAGNOSIS — H353133 Nonexudative age-related macular degeneration, bilateral, advanced atrophic without subfoveal involvement: Secondary | ICD-10-CM | POA: Diagnosis not present

## 2023-09-27 DIAGNOSIS — H2513 Age-related nuclear cataract, bilateral: Secondary | ICD-10-CM | POA: Diagnosis not present

## 2023-09-27 DIAGNOSIS — E109 Type 1 diabetes mellitus without complications: Secondary | ICD-10-CM | POA: Diagnosis not present

## 2023-10-04 ENCOUNTER — Other Ambulatory Visit: Payer: Self-pay | Admitting: Unknown Physician Specialty

## 2023-10-04 DIAGNOSIS — F331 Major depressive disorder, recurrent, moderate: Secondary | ICD-10-CM | POA: Diagnosis not present

## 2023-10-04 DIAGNOSIS — D385 Neoplasm of uncertain behavior of other respiratory organs: Secondary | ICD-10-CM | POA: Diagnosis not present

## 2023-10-04 DIAGNOSIS — F902 Attention-deficit hyperactivity disorder, combined type: Secondary | ICD-10-CM | POA: Diagnosis not present

## 2023-10-05 ENCOUNTER — Encounter: Payer: Self-pay | Admitting: Unknown Physician Specialty

## 2023-10-05 ENCOUNTER — Encounter: Payer: Self-pay | Admitting: Anesthesiology

## 2023-10-14 ENCOUNTER — Ambulatory Visit
Admission: RE | Admit: 2023-10-14 | Discharge: 2023-10-14 | Disposition: A | Discharge status: Home / Self Care | Attending: Unknown Physician Specialty | Admitting: Unknown Physician Specialty

## 2023-10-14 ENCOUNTER — Encounter: Payer: Self-pay | Admitting: Unknown Physician Specialty

## 2023-10-14 ENCOUNTER — Other Ambulatory Visit: Payer: Self-pay

## 2023-10-14 ENCOUNTER — Encounter
Admission: RE | Disposition: A | Discharge status: Home / Self Care | Payer: Self-pay | Source: Home / Self Care | Attending: Unknown Physician Specialty

## 2023-10-14 DIAGNOSIS — Z79899 Other long term (current) drug therapy: Secondary | ICD-10-CM | POA: Diagnosis not present

## 2023-10-14 DIAGNOSIS — B079 Viral wart, unspecified: Secondary | ICD-10-CM | POA: Diagnosis not present

## 2023-10-14 DIAGNOSIS — B078 Other viral warts: Secondary | ICD-10-CM | POA: Insufficient documentation

## 2023-10-14 DIAGNOSIS — D140 Benign neoplasm of middle ear, nasal cavity and accessory sinuses: Secondary | ICD-10-CM | POA: Diagnosis not present

## 2023-10-14 DIAGNOSIS — D489 Neoplasm of uncertain behavior, unspecified: Secondary | ICD-10-CM | POA: Diagnosis present

## 2023-10-14 HISTORY — DX: Type 2 diabetes mellitus without complications: E11.9

## 2023-10-14 HISTORY — DX: Major depressive disorder, single episode, unspecified: F32.9

## 2023-10-14 HISTORY — PX: EXCISION NASAL MASS: SHX6271

## 2023-10-14 LAB — GLUCOSE, CAPILLARY: Glucose-Capillary: 92 mg/dL (ref 70–99)

## 2023-10-14 SURGERY — EXCISION, MASS, NOSE
Anesthesia: LOCAL | Site: Nose | Laterality: Left

## 2023-10-14 MED ORDER — LIDOCAINE-EPINEPHRINE 1 %-1:100000 IJ SOLN
INTRAMUSCULAR | Status: DC | PRN
  Administered 2023-10-14: 1.5 mL

## 2023-10-14 SURGICAL SUPPLY — 16 items
APPLICATOR COTTON TIP 3IN (MISCELLANEOUS) IMPLANT
BASIN GRAD PLASTIC 32OZ STRL (MISCELLANEOUS) ×2 IMPLANT
BLADE SURG 15 STRL LF DISP TIS (BLADE) ×2 IMPLANT
CANISTER SUCT 1200ML W/VALVE (MISCELLANEOUS) ×2 IMPLANT
COVER MAYO STAND STRL (DRAPES) ×2 IMPLANT
GLOVE BIO SURGEON STRL SZ7.5 (GLOVE) ×4 IMPLANT
KIT TURNOVER KIT A (KITS) ×2 IMPLANT
MARKER SKIN DUAL TIP RULER LAB (MISCELLANEOUS) ×2 IMPLANT
NDL HYPO 25GX1X1/2 BEV (NEEDLE) ×2 IMPLANT
NEEDLE HYPO 25GX1X1/2 BEV (NEEDLE) ×1 IMPLANT
NS IRRIG 500ML POUR BTL (IV SOLUTION) ×2 IMPLANT
PACK ENT CUSTOM (PACKS) IMPLANT
SPONGE NEURO XRAY DETECT 1X3 (DISPOSABLE) ×2 IMPLANT
SYR 10ML LL (SYRINGE) ×2 IMPLANT
TOWEL OR 17X26 4PK STRL BLUE (TOWEL DISPOSABLE) ×2 IMPLANT
TUBING SUCTION CONN 0.25 STRL (TUBING) ×2 IMPLANT

## 2023-10-14 NOTE — H&P (Signed)
 The patient's history has been reviewed, patient examined, no change in status, stable for surgery.  Questions were answered to the patients satisfaction.

## 2023-10-14 NOTE — OR Nursing (Signed)
 Patient entered the OR and transferred self to the OR bed, promptly hooked up to the monitors.  11:05 BP 152/75 O2 100 HR 96 11:10 BP 188/85 O2 98 HR 97

## 2023-10-14 NOTE — Op Note (Signed)
 10/14/2023  11:16 AM    Con Dragon  [id number]   Pre-Op Dx: NEOPLASM OF UNCERTAIN BEHAVIOR, NASAL CAVITY  Post-op Dx: SAME  Proc: Excision of left intranasal papilloma  Surg:  Renee Young  Anes:  GOT  EBL: Less than 5 cc  Comp: None  Findings: Papillomatous lesion just inside the left columella superiorly  Procedure: Jenkins was identified in the holding area taken the operating room placed in supine position.  The nose was examined there was a papillomatous mass just inside the columella and the superior aspect of the nasal septum.  There was prepped with a alcohol swab.  A local anesthetic of 1% lidocaine  with 1 to 100,000 units of epinephrine  was used to injected the area surrounding the papilloma.  A total of 1 and half cc was used.  At approximately 5 minutes short sharp scissors were used to excise the papillomatous mass from the underlying mucosa.  With the mass removed in its entirety silver nitrate was used to cauterize the wound bed prevent bleeding.  With the mass removed in its entirety and no active bleeding triple antibiotic ointment was used to place in the nostril over the excision site.  The patient was then were taken to the cover room in stable condition.  Specimen: Left nasal papilloma  Dispo:   Good  Plan: Discharged home will apply ointment to nose twice daily until follow-up in 10 days  Renee Young  10/14/2023 11:16 AM

## 2023-10-18 LAB — SURGICAL PATHOLOGY

## 2023-10-20 ENCOUNTER — Ambulatory Visit: Admitting: Family Medicine

## 2023-10-25 ENCOUNTER — Encounter: Payer: Self-pay | Admitting: Emergency Medicine

## 2023-10-25 ENCOUNTER — Other Ambulatory Visit: Payer: Self-pay

## 2023-10-25 ENCOUNTER — Emergency Department

## 2023-10-25 ENCOUNTER — Emergency Department
Admission: EM | Admit: 2023-10-25 | Discharge: 2023-10-25 | Disposition: A | Discharge status: Home / Self Care | Attending: Emergency Medicine | Admitting: Emergency Medicine

## 2023-10-25 DIAGNOSIS — R0789 Other chest pain: Secondary | ICD-10-CM | POA: Insufficient documentation

## 2023-10-25 DIAGNOSIS — R911 Solitary pulmonary nodule: Secondary | ICD-10-CM | POA: Diagnosis not present

## 2023-10-25 DIAGNOSIS — E119 Type 2 diabetes mellitus without complications: Secondary | ICD-10-CM | POA: Insufficient documentation

## 2023-10-25 DIAGNOSIS — R918 Other nonspecific abnormal finding of lung field: Secondary | ICD-10-CM | POA: Diagnosis not present

## 2023-10-25 DIAGNOSIS — R079 Chest pain, unspecified: Secondary | ICD-10-CM | POA: Diagnosis not present

## 2023-10-25 LAB — CBC
HCT: 41.5 % (ref 36.0–46.0)
Hemoglobin: 12.9 g/dL (ref 12.0–15.0)
MCH: 25.1 pg — ABNORMAL LOW (ref 26.0–34.0)
MCHC: 31.1 g/dL (ref 30.0–36.0)
MCV: 80.9 fL (ref 80.0–100.0)
Platelets: 295 K/uL (ref 150–400)
RBC: 5.13 MIL/uL — ABNORMAL HIGH (ref 3.87–5.11)
RDW: 16.1 % — ABNORMAL HIGH (ref 11.5–15.5)
WBC: 6 K/uL (ref 4.0–10.5)
nRBC: 0 % (ref 0.0–0.2)

## 2023-10-25 LAB — TROPONIN I (HIGH SENSITIVITY)
Troponin I (High Sensitivity): 7 ng/L (ref <18)
Troponin I (High Sensitivity): 7 ng/L (ref <18)

## 2023-10-25 LAB — HEPATIC FUNCTION PANEL
ALT: 16 U/L (ref 0–44)
AST: 18 U/L (ref 15–41)
Albumin: 3.5 g/dL (ref 3.5–5.0)
Alkaline Phosphatase: 77 U/L (ref 38–126)
Bilirubin, Direct: <0.1 mg/dL (ref 0.0–0.2)
Total Bilirubin: 0.6 mg/dL (ref 0.0–1.2)
Total Protein: 7 g/dL (ref 6.5–8.1)

## 2023-10-25 LAB — BASIC METABOLIC PANEL WITH GFR
Anion gap: 10 (ref 5–15)
BUN: 22 mg/dL (ref 8–23)
CO2: 25 mmol/L (ref 22–32)
Calcium: 9.7 mg/dL (ref 8.9–10.3)
Chloride: 103 mmol/L (ref 98–111)
Creatinine, Ser: 0.75 mg/dL (ref 0.44–1.00)
GFR, Estimated: >60 mL/min (ref >60)
Glucose, Bld: 105 mg/dL — ABNORMAL HIGH (ref 70–99)
Potassium: 3.7 mmol/L (ref 3.5–5.1)
Sodium: 138 mmol/L (ref 135–145)

## 2023-10-25 LAB — LIPASE, BLOOD: Lipase: 57 U/L — ABNORMAL HIGH (ref 11–51)

## 2023-10-25 MED ORDER — ASPIRIN 81 MG PO CHEW
324.0000 mg | CHEWABLE_TABLET | Freq: Once | ORAL | Status: AC
  Administered 2023-10-25: 324 mg via ORAL
  Filled 2023-10-25: qty 4

## 2023-10-25 NOTE — Discharge Instructions (Signed)
 You were in the emergency department for left-sided chest pain and abdominal pain.  You had a chest x-ray and lab work done that was overall normal.  You had 2 heart enzymes (troponin) that were both normal, do not believe you are having a heart attack today.  It is importantly follow-up closely with your primary care physician.  If your chest pain or symptoms return it is important you return to the emergency department for reevaluation.  You are given information to follow-up as an outpatient with cardiology for your chest pain to discuss possible stress testing.

## 2023-10-25 NOTE — ED Triage Notes (Signed)
 Patient to ED via POV for left sided CP. Started this AM. Radiates to left shoulder and back. Denies cardiac hx.

## 2023-10-25 NOTE — ED Provider Notes (Signed)
 [facility] Provider Note    Event Date/Time   First MD Initiated Contact with Patient 10/25/23 0930     (approximate)   History   Chest Pain   HPI  Renee Young is a 78 y.o. female past history significant for diabetes, GERD, history of hiatal hernia, arthritis, anemia, who presents to the emergency department for chest pain.  Woke up from sleep with left-sided abdominal and chest pain.  States that she had severe pain that was on her epigastric region and left side of her abdomen that radiated up to her left arm and shoulder.  Mild shortness of breath associated with this.  States that that lasted for about 20 minutes and has been slowly improving since that time.  Significantly improved since arriving to the emergency department.  Denies any shortness of breath, nausea vomiting or diaphoresis.  Denies any significant cough.  Normal state of health last night prior to going to sleep.  Does state that it does hurt with ambulation.  No prior cardiac history no prior stress testing or cardiac catheterization.  1 prior episode of gallstones and states that her at that time but has not had anything since then.  No recent history of kidney stones but does have a remote history.  States this does not feel like prior episodes of kidney stones.  Denies dysuria, urinary urgency or frequency.  No falls or trauma.  No history of DVT or PE.     Physical Exam   Triage Vital Signs: ED Triage Vitals [10/25/23 0906]  Encounter Vitals Group     BP (!) 143/72     Girls Systolic BP Percentile      Girls Diastolic BP Percentile      Boys Systolic BP Percentile      Boys Diastolic BP Percentile      Pulse Rate 78     Resp 17     Temp 97.9 F (36.6 C)     Temp Source Oral     SpO2 100 %     Weight 160 lb (72.6 kg)     Height 5' 4 (1.626 m)     Head Circumference      Peak Flow      Pain Score 5     Pain Loc      Pain Education      Exclude from Growth Chart      Most recent vital signs: Vitals:   10/25/23 0906  BP: (!) 143/72  Pulse: 78  Resp: 17  Temp: 97.9 F (36.6 C)  SpO2: 100%    Physical Exam Constitutional:      Appearance: She is well-developed.  HENT:     Head: Atraumatic.  Eyes:     Extraocular Movements: Extraocular movements intact.     Conjunctiva/sclera: Conjunctivae normal.     Pupils: Pupils are equal, round, and reactive to light.  Cardiovascular:     Rate and Rhythm: Regular rhythm.     Heart sounds: Normal heart sounds.  Pulmonary:     Effort: No respiratory distress.  Abdominal:     General: There is no distension.     Palpations: Abdomen is soft.     Tenderness: There is no abdominal tenderness.     Comments: No CVA tenderness  Musculoskeletal:        General: Normal range of motion.     Cervical back: Normal range of motion.     Right lower leg: No edema.  Left lower leg: No edema.  Skin:    General: Skin is warm.     Capillary Refill: Capillary refill takes less than 2 seconds.  Neurological:     General: No focal deficit present.     Mental Status: She is alert. Mental status is at baseline.     IMPRESSION / MDM / ASSESSMENT AND PLAN / ED COURSE  I reviewed the triage vital signs and the nursing notes.  Differential diagnosis including ACS, anemia, pneumonia, gastritis/PUD, referred pain from her abdomen, dehydration, pyelonephritis, kidney stone.  Low suspicion for pulmonary embolism, low risk Wells criteria, no shortness of breath or pleuritic nature of her pain  Given aspirin   EKG  I, Clotilda Punter, the attending physician, personally viewed and interpreted this ECG.  EKG showed normal sinus rhythm.  Right bundle branch block.  Negative Sgarbossa's criteria.  No significant ST elevation or depression.  No significant change when compared to prior EKG no findings of acute ischemia or dysrhythmia.  No tachycardic or bradycardic dysrhythmias while on cardiac telemetry.  RADIOLOGY I  independently reviewed imaging, my interpretation of imaging: Chest x-ray with no signs of pneumonia, no widened mediastinum  LABS (all labs ordered are listed, but only abnormal results are displayed) Labs interpreted as -    Labs Reviewed  BASIC METABOLIC PANEL WITH GFR - Abnormal; Notable for the following components:      Result Value   Glucose, Bld 105 (*)    All other components within normal limits  CBC - Abnormal; Notable for the following components:   RBC 5.13 (*)    MCH 25.1 (*)    RDW 16.1 (*)    All other components within normal limits  LIPASE, BLOOD - Abnormal; Notable for the following components:   Lipase 57 (*)    All other components within normal limits  HEPATIC FUNCTION PANEL  URINALYSIS, W/ REFLEX TO CULTURE (INFECTION SUSPECTED)  TROPONIN I (HIGH SENSITIVITY)  TROPONIN I (HIGH SENSITIVITY)     MDM    Patient was aspirin .  Initial troponin was negative.  No significant leukocytosis or anemia.  Normal LFTs.  Lipase mildly elevated at 50 but does not meet criteria for acute pancreatitis and symptoms are not consistent with acute pancreatitis.  Plan for second heart enzyme.  Second heart enzyme is negative.  Heart score 4, on reevaluation currently chest pain-free and states she is feeling much better.  Tolerating p.o. with no further episodes of chest pain nausea or vomiting.  Have a low suspicion for ACS.  Repeat abdominal exam continues to be benign with no abdominal tenderness to palpation.  No rebound or guarding.  No significant CVA tenderness.  No dysuria, hematuria or symptoms concerning for kidney stone at this time.  Given her heart score 4 will give a referral for cardiology as an outpatient.  Discussed close follow-up with her primary care physician and discussed return to the emergency department for any return of symptoms.   PROCEDURES:  Critical Care performed: No  Procedures  Patient's presentation is most consistent with acute  presentation with potential threat to life or bodily function.   MEDICATIONS ORDERED IN ED: Medications  aspirin  chewable tablet 324 mg (324 mg Oral Given 10/25/23 0958)    FINAL CLINICAL IMPRESSION(S) / ED DIAGNOSES   Final diagnoses:  Atypical chest pain     Rx / DC Orders   ED Discharge Orders          Ordered    Ambulatory referral to  Cardiology       Comments: If you have not heard from the Cardiology office within the next 72 hours please call [phone].   10/25/23 1136             Note:  This document was prepared using Dragon voice recognition software and may include unintentional dictation errors.   Suzanne Kirsch, MD 10/25/23 [phone]

## 2023-10-25 NOTE — ED Notes (Signed)
 Gave pt a UA sample cup and instructed her to call Staff when need to urinate.

## 2023-11-02 ENCOUNTER — Other Ambulatory Visit
Admission: RE | Admit: 2023-11-02 | Discharge: 2023-11-02 | Disposition: A | Discharge status: Home / Self Care | Payer: Self-pay | Source: Ambulatory Visit | Attending: Medical Genetics | Admitting: Medical Genetics

## 2023-11-08 ENCOUNTER — Ambulatory Visit: Admitting: Family Medicine

## 2023-11-11 LAB — GENECONNECT MOLECULAR SCREEN: Genetic Analysis Overall Interpretation: NEGATIVE

## 2023-11-12 ENCOUNTER — Encounter: Payer: Self-pay | Admitting: Internal Medicine

## 2023-11-14 ENCOUNTER — Other Ambulatory Visit: Payer: Self-pay

## 2023-11-14 ENCOUNTER — Ambulatory Visit: Admitting: Internal Medicine

## 2023-11-14 ENCOUNTER — Encounter: Payer: Self-pay | Admitting: Internal Medicine

## 2023-11-14 VITALS — BP 122/76 | HR 95 | Temp 97.9°F | Resp 16 | Ht 64.0 in | Wt 161.3 lb

## 2023-11-14 DIAGNOSIS — K219 Gastro-esophageal reflux disease without esophagitis: Secondary | ICD-10-CM | POA: Diagnosis not present

## 2023-11-14 DIAGNOSIS — R918 Other nonspecific abnormal finding of lung field: Secondary | ICD-10-CM | POA: Diagnosis not present

## 2023-11-14 DIAGNOSIS — R079 Chest pain, unspecified: Secondary | ICD-10-CM

## 2023-11-14 MED ORDER — OMEPRAZOLE 20 MG PO CPDR: 20.0000 mg | DELAYED_RELEASE_CAPSULE | Freq: Every day | ORAL | 1 refills | Status: DC

## 2023-11-14 NOTE — Progress Notes (Signed)
 Acute Office Visit  Subjective:     Patient ID: Renee Young, female    DOB: [birth year], 78 y.o.   MRN: [id number]  Chief Complaint  Patient presents with   Follow-up    ER 2 weeks ago   Gastroesophageal Reflux    HPI Patient is in today for ER follow up for chest pain. She is a patient of Leisa's and this is my first time meeting her.   Discharge Date: 10/25/23 Diagnosis: atypical chest pain Procedures/tests: Chest x-ray showing nodular density overlaying the left upper lobe 2 cm, another possible additional nodular opacity in the right mid lung. EKG normal sinus rhythm with RBBB. Troponin negative x 2. CBC, CMP normal. Lipase mildly elevated.  Consultants: None New medications: aspirin  Discontinued medications: None Discharge instructions:  Follow up with Cardiology outpatient Status: better   Discussed the use of AI scribe software for clinical note transcription with the patient, who gave verbal consent to proceed.  History of Present Illness Renee Young is a 78 year old female who presents with chest pain.  She experienced chest pain radiating to her shoulder, neck, and left arm. In the ER, troponin and EKG were negative for cardiac issues. Aspirin  provided relief within hours. Since then, she has not had similar chest pain but experiences significant indigestion and reflux, which she usually has in the summer. The current cooler weather is atypical for her symptoms. Tums usually alleviate her reflux. She experiences burping after breakfast despite eating lightly and avoiding coffee, a known trigger.  She recently increased her Tylenol  dosage and started a new joint supplement. In the ER, a chest x-ray showed two nodules: one on the left upper lobe and a possible additional nodular opacity in the right mid lung.   Review of Systems  Respiratory:  Negative for shortness of breath.   Cardiovascular:  Negative for chest pain.  Gastrointestinal:  Positive for heartburn and  nausea. Negative for abdominal pain.        Objective:    BP 122/76 (Cuff Size: Large)   Pulse 95   Temp 97.9 F (36.6 C) (Oral)   Resp 16   Ht 5' 4 (1.626 m)   Wt 161 lb 4.8 oz (73.2 kg)   SpO2 99%   BMI 27.69 kg/m  BP Readings from Last 3 Encounters:  11/14/23 122/76  10/25/23 (!) 143/72  10/14/23 137/68   Wt Readings from Last 3 Encounters:  11/14/23 161 lb 4.8 oz (73.2 kg)  10/25/23 160 lb (72.6 kg)  10/14/23 163 lb 6.4 oz (74.1 kg)      Physical Exam Constitutional:      Appearance: Normal appearance.  HENT:     Head: Normocephalic and atraumatic.  Eyes:     Conjunctiva/sclera: Conjunctivae normal.  Cardiovascular:     Rate and Rhythm: Normal rate and regular rhythm.  Pulmonary:     Effort: Pulmonary effort is normal.     Breath sounds: Normal breath sounds.  Skin:    General: Skin is warm and dry.  Neurological:     General: No focal deficit present.     Mental Status: She is alert. Mental status is at baseline.  Psychiatric:        Behavior: Behavior normal.     No results found for any visits on 11/14/23.      Assessment & Plan:   Assessment & Plan Evaluation for possible cardiac disease Intermittent chest pain with negative troponin and EKG. Further evaluation needed to  rule out cardiac issues. - Refer to outpatient cardiologist for stress test. - Provide cardiologist contact for appointment scheduling.  Gastroesophageal reflux disease without esophagitis Frequent indigestion and reflux, managed with Tums. Discussed prescription-strength acid suppression. - Prescribe Prilosec 20 mg once daily for two weeks. - Advise daily Prilosec for two weeks, reassess symptoms. - Continue Prilosec up to 30 days if symptoms persist.  Pulmonary nodules, left upper lobe and possible right mid lung Chest x-ray showed 2 cm nodular density in left upper lobe and possible right mid lung opacity. Further investigation needed. - Order chest CT scan without  contrast. - Provide contact to schedule chest CT scan.  - CT CHEST WO CONTRAST; Future - omeprazole (PRILOSEC) 20 MG capsule; Take 1 capsule (20 mg total) by mouth daily.  Dispense: 30 capsule; Refill: 1    Return in about 4 weeks (around 12/12/2023) for before 10/24 .  Sharyle Fischer, DO

## 2023-11-15 ENCOUNTER — Ambulatory Visit
Admission: RE | Admit: 2023-11-15 | Discharge: 2023-11-15 | Disposition: A | Discharge status: Home / Self Care | Source: Ambulatory Visit | Attending: Internal Medicine | Admitting: Internal Medicine

## 2023-11-15 DIAGNOSIS — I700 Atherosclerosis of aorta: Secondary | ICD-10-CM | POA: Diagnosis not present

## 2023-11-15 DIAGNOSIS — R918 Other nonspecific abnormal finding of lung field: Secondary | ICD-10-CM | POA: Insufficient documentation

## 2023-11-17 NOTE — Telephone Encounter (Signed)
 Left message for patient

## 2023-11-18 ENCOUNTER — Ambulatory Visit: Admitting: Family Medicine

## 2023-11-21 ENCOUNTER — Ambulatory Visit: Payer: Self-pay | Admitting: Internal Medicine

## 2023-11-29 DIAGNOSIS — F902 Attention-deficit hyperactivity disorder, combined type: Secondary | ICD-10-CM | POA: Diagnosis not present

## 2023-11-29 DIAGNOSIS — F331 Major depressive disorder, recurrent, moderate: Secondary | ICD-10-CM | POA: Diagnosis not present

## 2023-11-30 ENCOUNTER — Telehealth: Payer: Self-pay | Admitting: Pharmacy Technician

## 2023-11-30 ENCOUNTER — Other Ambulatory Visit (HOSPITAL_COMMUNITY): Payer: Self-pay

## 2023-11-30 NOTE — Telephone Encounter (Signed)
 Pharmacy Patient Advocate Encounter  Received notification from [employer] that Prior Authorization for Januvia  25mg  tablets has been APPROVED from 11/30/2023 to 12/05/2023.   PA #/Case ID/Reference #: [id number]  Approval letter indexed to the patient media tab.

## 2023-12-02 ENCOUNTER — Other Ambulatory Visit: Payer: Self-pay

## 2023-12-02 ENCOUNTER — Ambulatory Visit: Payer: Self-pay | Admitting: Internal Medicine

## 2023-12-02 ENCOUNTER — Ambulatory Visit: Admitting: Internal Medicine

## 2023-12-02 ENCOUNTER — Encounter: Payer: Self-pay | Admitting: Internal Medicine

## 2023-12-02 VITALS — BP 126/82 | HR 100 | Temp 98.2°F | Resp 16 | Ht 64.0 in | Wt 167.1 lb

## 2023-12-02 DIAGNOSIS — E1165 Type 2 diabetes mellitus with hyperglycemia: Secondary | ICD-10-CM | POA: Diagnosis not present

## 2023-12-02 DIAGNOSIS — Z7984 Long term (current) use of oral hypoglycemic drugs: Secondary | ICD-10-CM

## 2023-12-02 DIAGNOSIS — K219 Gastro-esophageal reflux disease without esophagitis: Secondary | ICD-10-CM | POA: Diagnosis not present

## 2023-12-02 DIAGNOSIS — R918 Other nonspecific abnormal finding of lung field: Secondary | ICD-10-CM

## 2023-12-02 DIAGNOSIS — Z23 Encounter for immunization: Secondary | ICD-10-CM | POA: Diagnosis not present

## 2023-12-02 LAB — POCT GLYCOSYLATED HEMOGLOBIN (HGB A1C): Hemoglobin A1C: 5.9 % — AB (ref 4.0–5.6)

## 2023-12-02 MED ORDER — OMEPRAZOLE 20 MG PO CPDR: 20.0000 mg | DELAYED_RELEASE_CAPSULE | Freq: Every day | ORAL | 1 refills | Status: AC | PRN

## 2023-12-02 NOTE — Progress Notes (Signed)
 Acute Office Visit  Subjective:     Patient ID: Renee Young, female    DOB: 28-Jun-1945, 78 y.o.   MRN: [id number]  Chief Complaint  Patient presents with   Follow-up    4 week recheck GERD    HPI Patient is in today for follow up chest pain and acid reflux.   Discussed the use of AI scribe software for clinical note transcription with the patient, who gave verbal consent to proceed.  History of Present Illness Renee Young is a 78 year old female with acid reflux who presents for medication management and follow-up.  She manages her acid reflux with omeprazole, taking it as needed after an initial two-week course of 20 mg daily. She experiences occasional burping if dietary precautions are not followed but has had no recent reflux or pain. A CT scan shows a slight hiatal hernia. She avoids acidic foods, eats slowly and in smaller portions, and does not lie down after meals. She has 14 pills left and plans to travel to [place] for two months.  She has early-stage macular degeneration managed with vitamins, a cataract in one eye, and stable gum disease with regular dental cleanings. Her current medications include Januvia , which she plans to refill soon, and she uses Tylenol , Voltaren  gel, and Salonpas patches as needed. She received her flu and COVID vaccines a month ago.    Review of Systems  Respiratory:  Negative for shortness of breath.   Cardiovascular:  Negative for chest pain.  Gastrointestinal:  Negative for abdominal pain, heartburn and nausea.        Objective:    BP 126/82 (Cuff Size: Large)   Pulse 100   Temp 98.2 F (36.8 C) (Oral)   Resp 16   Ht 5' 4 (1.626 m)   Wt 167 lb 1.6 oz (75.8 kg)   SpO2 98%   BMI 28.68 kg/m  BP Readings from Last 3 Encounters:  12/02/23 126/82  11/14/23 122/76  10/25/23 (!) 143/72   Wt Readings from Last 3 Encounters:  12/02/23 167 lb 1.6 oz (75.8 kg)  11/14/23 161 lb 4.8 oz (73.2 kg)  10/25/23 160 lb (72.6 kg)       Physical Exam Constitutional:      Appearance: Normal appearance.  HENT:     Head: Normocephalic and atraumatic.  Eyes:     Conjunctiva/sclera: Conjunctivae normal.  Cardiovascular:     Rate and Rhythm: Normal rate and regular rhythm.  Pulmonary:     Effort: Pulmonary effort is normal.     Breath sounds: Normal breath sounds.  Skin:    General: Skin is warm and dry.  Neurological:     General: No focal deficit present.     Mental Status: She is alert. Mental status is at baseline.  Psychiatric:        Mood and Affect: Mood normal.        Behavior: Behavior normal.     No results found for any visits on 12/02/23.      Assessment & Plan:   Assessment & Plan Gastroesophageal reflux disease associated with hiatal hernia Symptoms improved with omeprazole. Occasional burping if dietary triggers not avoided. Small hiatal hernia exacerbates reflux. - Send omeprazole prescription to [facility] on [address] for as-needed use. - Advise on dietary triggers such as citrus, spicy foods, alcohol, chocolate, and carbonation. - Recommend lifestyle modifications including eating smaller, more frequent meals, and avoiding lying down after eating. - Provide educational printout on  GERD management.  Type 2 diabetes mellitus Communication with counselor maintained. Januvia  refill due. - Ensure Januvia  refill is picked up. - Perform A1c test today which was stable at 5.9%.  Pulmonary nodules, stable, low risk Multiple small pulmonary nodules on CT, largest 5 mm. No follow-up needed due to non-smoking status.  General Health Maintenance Flu and COVID vaccines administered a month ago. Pneumonia vaccine booster due. Discussed new RSV vaccine and TDAP booster need. - Administer pneumonia vaccine today. - Advise to obtain RSV vaccine at a pharmacy. - Advise to obtain TDAP vaccine at the health department.  Follow-Up Plan for follow-up visit in six months. - Advise to contact the  office if any issues arise before the next scheduled visit.  - omeprazole (PRILOSEC) 20 MG capsule; Take 1 capsule (20 mg total) by mouth daily as needed (acid reflux).  Dispense: 90 capsule; Refill: 1 - POCT HgB A1C - Pneumococcal conjugate vaccine 20-valent (Prevnar 20)    Return in about 6 months (around 06/01/2024).  Sharyle Fischer, DO

## 2023-12-02 NOTE — Patient Instructions (Addendum)
 It was great seeing you today!  Plan discussed at today's visit: -Prevnar 20 (pneumonia) vaccine given today -Recommend considering the RSV vaccine at the pharmacy -A1c today as well   Follow up in: 6 months   Take care and let us  know if you have any questions or concerns prior to your next visit.  Dr. Bernardo    GERD in Adults: What to Know  Gastroesophageal reflux (GER) is when acid from your stomach flows up into your esophagus. Your esophagus is the part of your body that moves food from your mouth to your stomach. Normally, food goes down and stays in your stomach to be digested. But with GER, food and stomach acid may go back up. You may have a disease called gastroesophageal reflux disease (GERD) if the reflux: Happens often. Causes very bad symptoms. Makes your esophagus sore and swollen. Over time, GERD can make small holes called ulcers in the lining of your esophagus. What are the causes? GERD is caused by a problem with the muscle between your esophagus and stomach. This muscle is called the lower esophageal sphincter (LES). When it's weak or not normal, it doesn't close like it should. This means food and stomach acid can go back up into your esophagus. The muscle can be weak if: You smoke or use products with tobacco in them. You're pregnant. You have a type of hernia called a hiatal hernia. You eat certain foods and drinks. These include: Alcohol. Coffee. Chocolate. Onions. Peppermint. What increases the risk? Being overweight. Having a disease that affects your connective tissue. Taking NSAIDs, such as ibuprofen. What are the signs or symptoms? Heartburn. Trouble swallowing. Pain when you swallow. The feeling of having a lump in your throat. A bitter taste in your mouth. Bad breath. Having an upset or bloated stomach. Burping. Chest pain. Other conditions can also cause chest pain. Make sure you see your health care provider if you have chest  pain. Wheezing. This is when you make high-pitched whistling sounds when you breathe, most often when you breathe out. A long-term cough or a cough at night. How is this diagnosed? GERD may be diagnosed based on your medical history and a physical exam. You may also have tests. These may include: An endoscopy. This test looks at your stomach and esophagus with a small camera. A barium swallow test. This shows the shape and size of your esophagus and how well it's working. Tests of your esophagus to check for: Acid levels. Pressure. How is this treated? Treatment may depend on how bad your symptoms are. It may include: Changes to your diet and daily life. Medicines. Surgery. Follow these instructions at home: Eating and drinking Follow an eating plan as told by your provider. You may need to avoid certain foods and drinks. These may include: Coffee and tea, with or without caffeine. Alcohol. Energy drinks and sports drinks. Fizzy drinks or sodas. Chocolate and cocoa. Peppermint and mint flavorings. Garlic and onions. Horseradish. Spicy and acidic foods. These include: Peppers. Chili powder and curry powder. Vinegar. Hot sauces and BBQ sauce. Citrus fruits and juices. These include: Oranges. Lemons. Limes. Tomato-based foods. These include: Red sauce and pizza with red sauce. Chili. Salsa. Fried and fatty foods. These include: Donuts. [place] fries. Potato chips. High-fat dressings. High-fat meats. These include: Hot dogs and sausage. Rib eye steak. Ham and bacon. High-fat dairy items. These include: Whole milk. Butter. Cream cheese. Eat small meals often. Avoid eating big meals. Avoid drinking lots of liquid  with your meals. Try not to eat meals during the 2-3 hours before bedtime. Try not to lie down right after you eat. Do not exercise right after you eat. Lifestyle  If you're overweight, lose an amount of weight that's healthy for you. Ask your provider  about a safe weight loss goal. Do not smoke, vape, or use nicotine or tobacco. Wear loose clothes. Do not wear things that are tight around your waist. When you sleep, try: Raising the head of your bed about 6 inches (15 cm). You can use a wedge to do this. Lying down on your left side. Try to lower your stress. If you need help doing this, ask your provider. General instructions Take your medicines only as told. Do not take aspirin  or ibuprofen unless you're told to. Watch for any changes in your symptoms. Do not bend over if it makes your symptoms worse. Contact a health care provider if: You have new symptoms. You have trouble: Drinking. Swallowing. Eating. It hurts to swallow. You have wheezing. You have a cough that won't go away. Your voice is hoarse. Your symptoms don't get better with treatment. Get help right away if: You have pain all of a sudden in your: Arm. Neck. Jaw. Teeth. Back. You feel sweaty, dizzy, or light-headed all of a sudden. You faint. You have chest pain or shortness of breath. You vomit and the vomit is: Green, yellow, or black. Looks like blood or coffee grounds. Your poop is red, bloody, or black. These symptoms may be an emergency. Call 911 right away. Do not wait to see if the symptoms will go away. Do not drive yourself to the hospital. This information is not intended to replace advice given to you by your health care provider. Make sure you discuss any questions you have with your health care provider. Document Revised: 12/14/2022 Document Reviewed: 06/30/2022 Elsevier Patient Education  2024 [employer].

## 2023-12-07 ENCOUNTER — Ambulatory Visit: Attending: Cardiology | Admitting: Cardiology

## 2023-12-07 ENCOUNTER — Encounter: Payer: Self-pay | Admitting: Cardiology

## 2023-12-07 VITALS — BP 150/58 | HR 78 | Ht 64.0 in | Wt 163.2 lb

## 2023-12-07 DIAGNOSIS — I700 Atherosclerosis of aorta: Secondary | ICD-10-CM | POA: Diagnosis not present

## 2023-12-07 DIAGNOSIS — E782 Mixed hyperlipidemia: Secondary | ICD-10-CM

## 2023-12-07 DIAGNOSIS — R072 Precordial pain: Secondary | ICD-10-CM | POA: Diagnosis not present

## 2023-12-07 MED ORDER — EZETIMIBE 10 MG PO TABS: 10.0000 mg | ORAL_TABLET | Freq: Every day | ORAL | 3 refills | Status: AC

## 2023-12-07 MED ORDER — ASPIRIN 81 MG PO TBEC
81.0000 mg | DELAYED_RELEASE_TABLET | Freq: Every day | ORAL | Status: AC
Start: 2023-12-07 — End: ?

## 2023-12-07 MED ORDER — METOPROLOL TARTRATE 100 MG PO TABS: ORAL_TABLET | ORAL | 0 refills | Status: AC

## 2023-12-07 NOTE — Patient Instructions (Signed)
 Medication Instructions:  - START zetia 10 mg daily for cholesterol - START aspirin  81 mg daily for stroke prevention - Take ONE 100 mg metoprolol two hours prior to cardiac CT  *If you need a refill on your cardiac medications before your next appointment, please call your pharmacy*  Lab Work: Your provider would like for you to have following labs drawn today BMP. Your provider would like for you to return on day of echo to have the following labs drawn: fasting lipid panel.  Please go to [facility] [address] [address], [place] [address] You do not need an appointment.  They are open from 8 am- 4:30 pm.  Lunch from 1:00 pm- 2:00 pm You DO need to be fasting.  If you have labs (blood work) drawn today and your tests are completely normal, you will receive your results only by: MyChart Message (if you have MyChart) OR A paper copy in the mail If you have any lab test that is abnormal or we need to change your treatment, we will call you to review the results.  Testing/Procedures: Your physician has requested that you have an echocardiogram. Echocardiography is a painless test that uses sound waves to create images of your heart. It provides your doctor with information about the size and shape of your heart and how well your heart's chambers and valves are working.   You may receive an ultrasound enhancing agent through an IV if needed to better visualize your heart during the echo. This procedure takes approximately one hour.  There are no restrictions for this procedure.  This will take place at [address]) [address], [place] [address]  Please note: We ask at that you not bring children with you during ultrasound (echo/ vascular) testing. Due to room size and safety concerns, children are not allowed in the ultrasound rooms during exams. Our front office staff cannot provide observation of children in our lobby area  while testing is being conducted. An adult accompanying a patient to their appointment will only be allowed in the ultrasound room at the discretion of the ultrasound technician under special circumstances. We apologize for any inconvenience.   Your cardiac CT will be scheduled at:  [facility] [address] [place], [place] [address] [phone]  Please arrive 15 mins early for check-in and test prep.  There is spacious parking and easy access to the radiology department from the [facility] entrance. Please enter here and check-in with the desk attendant.    Please follow these instructions carefully (unless otherwise directed):  An IV will be required for this test and Nitroglycerin will be given.  Hold all erectile dysfunction medications at least 3 days (72 hrs) prior to test. (Ie viagra, cialis, sildenafil, tadalafil, etc)     On the Night Before the Test: Be sure to Drink plenty of water . Do not consume any caffeinated/decaffeinated beverages or chocolate 12 hours prior to your test. Do not take any antihistamines 12 hours prior to your test.  On the Day of the Test: Drink plenty of water  until 1 hour prior to the test. Do not eat any food 1 hour prior to test. You may take your regular medications prior to the test.  Take metoprolol (Lopressor) two hours prior to test.   If you take Furosemide/Hydrochlorothiazide/Spironolactone/Chlorthalidone, please HOLD on the morning of the test. Patients who wear a continuous glucose monitor MUST remove the device prior  to scanning. FEMALES- please wear underwire-free bra if available, avoid dresses & tight clothing  After the Test: Drink plenty of water . After receiving IV contrast, you may experience a mild flushed feeling. This is normal. On occasion, you may experience a mild rash up to 24 hours after the test. This is not dangerous. If this occurs, you can take Benadryl  25 mg, Zyrtec,  Claritin, or Allegra and increase your fluid intake. (Patients taking Tikosyn should avoid Benadryl , and may take Zyrtec, Claritin, or Allegra) If you experience trouble breathing, this can be serious. If it is severe call 911 IMMEDIATELY. If it is mild, please call our office.  We will call to schedule your test 2-4 weeks out understanding that some insurance companies will need an authorization prior to the service being performed.   For more information and frequently asked questions, please visit our website : [email or web address]  For non-scheduling related questions, please contact the cardiac imaging nurse navigator should you have any questions/concerns: Cardiac Imaging Nurse Navigators Direct Office Dial: [phone]   For scheduling needs, including cancellations and rescheduling, please call [place], [phone].    Follow-Up: At [facility], you and your health needs are our priority.  As part of our continuing mission to provide you with exceptional heart care, our providers are all part of one team.  This team includes your primary Cardiologist (physician) and Advanced Practice Providers or APPs (Physician Assistants and Nurse Practitioners) who all work together to provide you with the care you need, when you need it.  Your next appointment:   3 month(s)  Provider:   You may see Dr Darliss or one of the following Advanced Practice Providers on your designated Care Team:   Lonni Meager, NP Lesley Maffucci, PA-C Bernardino Bring, PA-C Cadence [place], PA-C Tylene Lunch, NP Barnie Hila, NP    We recommend signing up for the patient portal called MyChart.  Sign up information is provided on this After Visit Summary.  MyChart is used to connect with patients for Virtual Visits (Telemedicine).  Patients are able to view lab/test results, encounter notes, upcoming appointments, etc.  Non-urgent messages can be sent to your provider as well.   To  learn more about what you can do with MyChart, go to [email or web address].

## 2023-12-07 NOTE — Progress Notes (Signed)
 Cardiology Office Note:    Date:  12/07/2023   ID:  [name], DOB [birth year], MRN [id number]  PCP:  Leavy Mole, PA-C   [facility] Providers Cardiologist:  None     Referring MD: Suzanne Kirsch, MD   Chief Complaint  Patient presents with   Establish Care    New pt ED follow up pt has been doing well with no complaints of chest pain, chest pressure or SOB, medciation reviewed verbally with patient    History of Present Illness:    Renee Young is a 78 y.o. female with a hx of hyperlipidemia, diabetes, GERD, hiatal hernia presenting with chest pain.  Evaluated in the ED last month after waking up with left-sided chest pain and abdominal discomfort, chest discomfort located on the left chest radiating to left shoulder.  Symptoms lasted for about an hour and a half.  Has not had any episodes since.  Sister had a heart attack at age 52, father had a heart attack around age 80.  She has high cholesterol, tried statins about 10 to 14 years ago but stopped due to memory issues while on statins.  Past Medical History:  Diagnosis Date   ADD (attention deficit disorder)    Allergy    Anemia    Arthritis    Bilateral temporomandibular joint pain 05/30/2019   Chicken pox    Colon polyps    Depression    Diabetes mellitus without complication (HCC)    GERD (gastroesophageal reflux disease)    History of hiatal hernia    History of kidney stones    Major depressive disorder    Phlebitis    Type 2 diabetes mellitus (HCC)    UTI (urinary tract infection)    UTI (urinary tract infection)    Varicose veins of bilateral lower extremities with other complications 07/03/2012    Past Surgical History:  Procedure Laterality Date   CESAREAN SECTION     x2   COLONOSCOPY WITH PROPOFOL  N/A 10/27/2022   Procedure: COLONOSCOPY WITH PROPOFOL ;  Surgeon: Therisa Bi, MD;  Location: [facility] ENDOSCOPY;  Service: Gastroenterology;  Laterality: N/A;   EXCISION NASAL MASS Left 10/14/2023    Procedure: EXCISION, MASS, NOSE;  Surgeon: Herminio Miu, MD;  Location: [facility];  Service: ENT;  Laterality: Left;   POLYPECTOMY  10/27/2022   Procedure: POLYPECTOMY;  Surgeon: Therisa Bi, MD;  Location: [facility] ENDOSCOPY;  Service: Gastroenterology;;   TOTAL KNEE ARTHROPLASTY Left 10/08/2019   Procedure: TOTAL KNEE ARTHROPLASTY;  Surgeon: Melodi Lerner, MD;  Location: [facility] ORS;  Service: Orthopedics;  Laterality: Left;   TOTAL KNEE ARTHROPLASTY Right 02/28/2023   Procedure: TOTAL KNEE ARTHROPLASTY;  Surgeon: Melodi Lerner, MD;  Location: [facility] ORS;  Service: Orthopedics;  Laterality: Right;   TUBAL LIGATION     URETEROCELE INCISION     VASCULAR SURGERY     varicose vein laser surgery   WISDOM TOOTH EXTRACTION      Current Medications: Current Meds  Medication Sig   ACCU-CHEK GUIDE test strip    Accu-Chek Softclix Lancets lancets Check glucose twice a day   acetaminophen  (TYLENOL ) 650 MG CR tablet Take 650 mg by mouth every 8 (eight) hours as needed for pain.   amphetamine -dextroamphetamine  (ADDERALL) 30 MG tablet Take 1 tablet by mouth 2 (two) times daily.   aspirin  EC 81 MG tablet Take 1 tablet (81 mg total) by mouth daily. Swallow whole.   blood glucose meter kit and supplies KIT Dispense based on patient  and insurance preference. Use up to four times daily as directed. (FOR ICD-9 250.00, 250.01).   diclofenac  Sodium (VOLTAREN ) 1 % GEL Apply 2 g topically 4 (four) times daily as needed (pain.).   ezetimibe (ZETIA) 10 MG tablet Take 1 tablet (10 mg total) by mouth daily.   FLUoxetine  (PROZAC ) 20 MG capsule Take 40 mg by mouth at bedtime.    Menthol -Methyl Salicylate (SALONPAS PAIN RELIEF PATCH EX) Place 1 patch onto the skin daily as needed (pain.).   metoprolol tartrate (LOPRESSOR) 100 MG tablet TAKE 1 TABLET 2 HR PRIOR TO CARDIAC PROCEDURE   Multiple Vitamins-Minerals (ICAPS AREDS 2 PO) Take by mouth daily.   omeprazole (PRILOSEC) 20 MG capsule Take 1 capsule (20 mg  total) by mouth daily as needed (acid reflux).   Red Yeast Rice Extract (RED YEAST RICE PO) Take 2 capsules by mouth at bedtime.   sitaGLIPtin  (JANUVIA ) 25 MG tablet Take 1 tablet (25 mg total) by mouth daily.   VITAMIN D PO Take 2,000 Units by mouth in the morning.     Allergies:   Buckwheat, Nitrofurantoin, Penicillins, and Statins   Social History   Socioeconomic History   Marital status: Married    Spouse name: Not on file   Number of children: Not on file   Years of education: Not on file   Highest education level: Not on file  Occupational History   Not on file  Tobacco Use   Smoking status: Never   Smokeless tobacco: Never  Vaping Use   Vaping status: Never Used  Substance and Sexual Activity   Alcohol use: Yes    Alcohol/week: 2.0 standard drinks of alcohol    Types: 2 Glasses of wine per week    Comment: occas.   Drug use: No   Sexual activity: Not Currently  Other Topics Concern   Not on file  Social History Narrative   Married   Retired Financial trader   Social Drivers of Health   Financial Resource Strain: Low Risk  (09/20/2022)   Overall Financial Resource Strain (CARDIA)    Difficulty of Paying Living Expenses: Not hard at all  Food Insecurity: No Food Insecurity (02/28/2023)   Hunger Vital Sign    Worried About Running Out of Food in the Last Year: Never true    Ran Out of Food in the Last Year: Never true  Transportation Needs: No Transportation Needs (02/28/2023)   PRAPARE - Administrator, Civil Service (Medical): No    Lack of Transportation (Non-Medical): No  Physical Activity: Insufficiently Active (09/20/2022)   Exercise Vital Sign    Days of Exercise per Week: 4 days    Minutes of Exercise per Session: 30 min  Stress: Stress Concern Present (09/20/2022)   [employer] of Occupational Health - Occupational Stress Questionnaire    Feeling of Stress : Rather much  Social Connections: Moderately Integrated (02/28/2023)   Social  Connection and Isolation Panel    Frequency of Communication with Friends and Family: More than three times a week    Frequency of Social Gatherings with Friends and Family: Once a week    Attends Religious Services: Never    Database administrator or Organizations: No    Attends Engineer, structural: 1 to 4 times per year    Marital Status: Married     Family History: The patient's family history includes Arthritis in her brother, daughter, and sister; Cancer in her mother; Diabetes in her  father; Heart attack in her paternal grandfather and sister; Heart disease in her father, maternal grandfather, maternal grandmother, paternal grandfather, and paternal grandmother; Hyperlipidemia in her father and maternal grandfather; Hypertension in her father and maternal grandmother; Kidney disease in her mother; Obesity in her brother; Stroke in her father, maternal grandmother, and paternal grandmother.  ROS:   Please see the history of present illness.     All other systems reviewed and are negative.  EKGs/Labs/Other Studies Reviewed:    The following studies were reviewed today:  EKG Interpretation Date/Time:  Wednesday December 07 2023 08:42:43 EDT Ventricular Rate:  78 PR Interval:  154 QRS Duration:  130 QT Interval:  420 QTC Calculation: 478 R Axis:   -12  Text Interpretation: Normal sinus rhythm Possible Left atrial enlargement Right bundle branch block Confirmed by Darliss Rogue ([id number]) on 12/07/2023 8:55:33 AM    Recent Labs: 10/25/2023: ALT 16; BUN 22; Creatinine, Ser 0.75; Hemoglobin 12.9; Platelets 295; Potassium 3.7; Sodium 138  Recent Lipid Panel    Component Value Date/Time   CHOL 265 (H) 06/10/2023 0823   TRIG 160 (H) 06/10/2023 0823   HDL 45 (L) 06/10/2023 0823   CHOLHDL 5.9 (H) 06/10/2023 0823   VLDL 43 (H) 01/07/2014 0846   LDLCALC 187 (H) 06/10/2023 0823   LDLDIRECT 222.0 03/07/2009 1100     Risk Assessment/Calculations:         Physical Exam:     VS:  BP (!) 150/58 (BP Location: Left Arm, Patient Position: Sitting)   Pulse 78   Ht 5' 4 (1.626 m)   Wt 163 lb 3.2 oz (74 kg)   SpO2 97%   BMI 28.01 kg/m     Wt Readings from Last 3 Encounters:  12/07/23 163 lb 3.2 oz (74 kg)  12/02/23 167 lb 1.6 oz (75.8 kg)  11/14/23 161 lb 4.8 oz (73.2 kg)     GEN:  Well nourished, well developed in no acute distress HEENT: Normal NECK: No JVD; No carotid bruits CARDIAC: RRR, no murmurs, rubs, gallops RESPIRATORY:  Clear to auscultation without rales, wheezing or rhonchi  ABDOMEN: Soft, non-tender, non-distended MUSCULOSKELETAL:  No edema; No deformity  SKIN: Warm and dry NEUROLOGIC:  Alert and oriented x 3 PSYCHIATRIC:  Normal affect   ASSESSMENT:    1. Precordial pain   2. Mixed hyperlipidemia   3. Aortic atherosclerosis    PLAN:    In order of problems listed above:  Chest pain, several risk factors.  Obtain echocardiogram, obtain coronary CTA. Hyperlipidemia, possibly FH.  Did not tolerate statins in the past due to memory issues.  Start Zetia 10 mg daily, start aspirin  81 mg daily.  Repeat lipid panel in 2 to 3 months. Aortic atherosclerosis, aspirin  81 mg, Zetia 10 mg daily as above.   Follow-up after cardiac testing      Medication Adjustments/Labs and Tests Ordered: Current medicines are reviewed at length with the patient today.  Concerns regarding medicines are outlined above.  Orders Placed This Encounter  Procedures   CT CORONARY MORPH W/CTA COR W/SCORE W/CA W/CM &/OR WO/CM   Lipid panel   Basic metabolic panel with GFR   EKG 87-Ozji   ECHOCARDIOGRAM COMPLETE   Meds ordered this encounter  Medications   aspirin  EC 81 MG tablet    Sig: Take 1 tablet (81 mg total) by mouth daily. Swallow whole.   ezetimibe (ZETIA) 10 MG tablet    Sig: Take 1 tablet (10 mg total) by mouth daily.  Dispense:  90 tablet    Refill:  3   metoprolol tartrate (LOPRESSOR) 100 MG tablet    Sig: TAKE 1 TABLET 2 HR PRIOR TO  CARDIAC PROCEDURE    Dispense:  1 tablet    Refill:  0    Patient Instructions  Medication Instructions:  - START zetia 10 mg daily for cholesterol - START aspirin  81 mg daily for stroke prevention - Take ONE 100 mg metoprolol two hours prior to cardiac CT  *If you need a refill on your cardiac medications before your next appointment, please call your pharmacy*  Lab Work: Your provider would like for you to have following labs drawn today BMP. Your provider would like for you to return on day of echo to have the following labs drawn: fasting lipid panel.  Please go to [facility] [address] ([facility] [address], [place] [address] You do not need an appointment.  They are open from 8 am- 4:30 pm.  Lunch from 1:00 pm- 2:00 pm You DO need to be fasting.  If you have labs (blood work) drawn today and your tests are completely normal, you will receive your results only by: MyChart Message (if you have MyChart) OR A paper copy in the mail If you have any lab test that is abnormal or we need to change your treatment, we will call you to review the results.  Testing/Procedures: Your physician has requested that you have an echocardiogram. Echocardiography is a painless test that uses sound waves to create images of your heart. It provides your doctor with information about the size and shape of your heart and how well your heart's chambers and valves are working.   You may receive an ultrasound enhancing agent through an IV if needed to better visualize your heart during the echo. This procedure takes approximately one hour.  There are no restrictions for this procedure.  This will take place at [address] [facility]) [address], [place] [address]  Please note: We ask at that you not bring children with you during ultrasound (echo/ vascular) testing. Due to room size and safety concerns, children are not allowed in the ultrasound rooms during  exams. Our front office staff cannot provide observation of children in our lobby area while testing is being conducted. An adult accompanying a patient to their appointment will only be allowed in the ultrasound room at the discretion of the ultrasound technician under special circumstances. We apologize for any inconvenience.   Your cardiac CT will be scheduled at:  [facility] [address] [place], [place] [address] [phone]  Please arrive 15 mins early for check-in and test prep.  There is spacious parking and easy access to the radiology department from the [facility] entrance. Please enter here and check-in with the desk attendant.    Please follow these instructions carefully (unless otherwise directed):  An IV will be required for this test and Nitroglycerin will be given.  Hold all erectile dysfunction medications at least 3 days (72 hrs) prior to test. (Ie viagra, cialis, sildenafil, tadalafil, etc)     On the Night Before the Test: Be sure to Drink plenty of water . Do not consume any caffeinated/decaffeinated beverages or chocolate 12 hours prior to your test. Do not take any antihistamines 12 hours prior to your test.  On the Day of the Test: Drink plenty of water  until 1 hour prior to the test. Do not eat any  food 1 hour prior to test. You may take your regular medications prior to the test.  Take metoprolol (Lopressor) two hours prior to test.   If you take Furosemide/Hydrochlorothiazide/Spironolactone/Chlorthalidone, please HOLD on the morning of the test. Patients who wear a continuous glucose monitor MUST remove the device prior to scanning. FEMALES- please wear underwire-free bra if available, avoid dresses & tight clothing  After the Test: Drink plenty of water . After receiving IV contrast, you may experience a mild flushed feeling. This is normal. On occasion, you may experience a mild rash up to 24 hours after the  test. This is not dangerous. If this occurs, you can take Benadryl  25 mg, Zyrtec, Claritin, or Allegra and increase your fluid intake. (Patients taking Tikosyn should avoid Benadryl , and may take Zyrtec, Claritin, or Allegra) If you experience trouble breathing, this can be serious. If it is severe call 911 IMMEDIATELY. If it is mild, please call our office.  We will call to schedule your test 2-4 weeks out understanding that some insurance companies will need an authorization prior to the service being performed.   For more information and frequently asked questions, please visit our website : [email or web address]  For non-scheduling related questions, please contact the cardiac imaging nurse navigator should you have any questions/concerns: Cardiac Imaging Nurse Navigators Direct Office Dial: [phone]   For scheduling needs, including cancellations and rescheduling, please call [place], [phone].    Follow-Up: At [facility], you and your health needs are our priority.  As part of our continuing mission to provide you with exceptional heart care, our providers are all part of one team.  This team includes your primary Cardiologist (physician) and Advanced Practice Providers or APPs (Physician Assistants and Nurse Practitioners) who all work together to provide you with the care you need, when you need it.  Your next appointment:   3 month(s)  Provider:   You may see Dr Darliss or one of the following Advanced Practice Providers on your designated Care Team:   Lonni Meager, NP Lesley Maffucci, PA-C Bernardino Bring, PA-C Cadence Glasgow, PA-C Tylene Lunch, NP Barnie Hila, NP    We recommend signing up for the patient portal called MyChart.  Sign up information is provided on this After Visit Summary.  MyChart is used to connect with patients for Virtual Visits (Telemedicine).  Patients are able to view lab/test results, encounter notes, upcoming  appointments, etc.  Non-urgent messages can be sent to your provider as well.   To learn more about what you can do with MyChart, go to [email or web address].            Signed, Redell Darliss, MD  12/07/2023 12:40 PM    [facility]

## 2023-12-12 ENCOUNTER — Ambulatory Visit: Admitting: Family Medicine

## 2024-02-14 ENCOUNTER — Encounter (HOSPITAL_COMMUNITY): Payer: Self-pay

## 2024-02-23 ENCOUNTER — Encounter (HOSPITAL_COMMUNITY): Payer: Self-pay

## 2024-02-24 ENCOUNTER — Encounter (HOSPITAL_COMMUNITY): Payer: Self-pay

## 2024-02-28 ENCOUNTER — Ambulatory Visit: Attending: Cardiology

## 2024-02-28 ENCOUNTER — Ambulatory Visit

## 2024-02-28 ENCOUNTER — Other Ambulatory Visit

## 2024-02-28 DIAGNOSIS — I083 Combined rheumatic disorders of mitral, aortic and tricuspid valves: Secondary | ICD-10-CM

## 2024-02-28 DIAGNOSIS — I517 Cardiomegaly: Secondary | ICD-10-CM

## 2024-02-28 DIAGNOSIS — I5030 Unspecified diastolic (congestive) heart failure: Secondary | ICD-10-CM

## 2024-02-28 DIAGNOSIS — R072 Precordial pain: Secondary | ICD-10-CM | POA: Diagnosis not present

## 2024-02-28 LAB — ECHOCARDIOGRAM COMPLETE
AR max vel: 2.22 cm2
AV Area VTI: 2.08 cm2
AV Area mean vel: 2.17 cm2
AV Mean grad: 6 mmHg
AV Peak grad: 10.6 mmHg
Ao pk vel: 1.63 m/s
Area-P 1/2: 5.66 cm2
S' Lateral: 3.03 cm

## 2024-02-29 ENCOUNTER — Ambulatory Visit: Payer: Self-pay | Admitting: Cardiology

## 2024-03-02 ENCOUNTER — Other Ambulatory Visit: Payer: Self-pay

## 2024-03-02 DIAGNOSIS — E782 Mixed hyperlipidemia: Secondary | ICD-10-CM

## 2024-03-02 DIAGNOSIS — R072 Precordial pain: Secondary | ICD-10-CM

## 2024-03-03 LAB — LIPID PANEL
Chol/HDL Ratio: 5.6 ratio — ABNORMAL HIGH (ref 0.0–4.4)
Cholesterol, Total: 225 mg/dL — ABNORMAL HIGH (ref 100–199)
HDL: 40 mg/dL
LDL Chol Calc (NIH): 156 mg/dL — ABNORMAL HIGH (ref 0–99)
Triglycerides: 160 mg/dL — ABNORMAL HIGH (ref 0–149)
VLDL Cholesterol Cal: 29 mg/dL (ref 5–40)

## 2024-03-03 LAB — BASIC METABOLIC PANEL WITH GFR
BUN/Creatinine Ratio: 19 (ref 12–28)
BUN: 15 mg/dL (ref 8–27)
CO2: 23 mmol/L (ref 20–29)
Calcium: 10.3 mg/dL (ref 8.7–10.3)
Chloride: 103 mmol/L (ref 96–106)
Creatinine, Ser: 0.77 mg/dL (ref 0.57–1.00)
Glucose: 102 mg/dL — ABNORMAL HIGH (ref 70–99)
Potassium: 4.1 mmol/L (ref 3.5–5.2)
Sodium: 141 mmol/L (ref 134–144)
eGFR: 79 mL/min/1.73

## 2024-03-05 ENCOUNTER — Other Ambulatory Visit: Payer: Self-pay

## 2024-03-05 ENCOUNTER — Telehealth: Payer: Self-pay | Admitting: Pharmacy Technician

## 2024-03-05 DIAGNOSIS — R072 Precordial pain: Secondary | ICD-10-CM

## 2024-03-05 DIAGNOSIS — Z780 Asymptomatic menopausal state: Secondary | ICD-10-CM

## 2024-03-05 MED ORDER — BEMPEDOIC ACID 180 MG PO TABS: 180.0000 mg | ORAL_TABLET | Freq: Every day | ORAL | 3 refills | Status: AC

## 2024-03-05 NOTE — Telephone Encounter (Signed)
"   Nexletol   Pharmacy Patient Advocate Encounter   Received notification from Onbase CMM KEY that prior authorization for nexletol  is required/requested.   Insurance verification completed.   The patient is insured through [employer].   Per test claim: PA required; PA submitted to above mentioned insurance via Latent Key/confirmation #/EOC [id number] Status is pending  "

## 2024-03-05 NOTE — Telephone Encounter (Signed)
 Pharmacy Patient Advocate Encounter  Received notification from [employer] that Prior Authorization for nexletol  has been APPROVED from 03/05/24 to 02/14/25   PA #/Case ID/Reference #: [id number]

## 2024-03-06 ENCOUNTER — Other Ambulatory Visit: Payer: Self-pay

## 2024-03-13 ENCOUNTER — Encounter (HOSPITAL_COMMUNITY): Payer: Self-pay

## 2024-03-14 ENCOUNTER — Telehealth: Payer: Self-pay

## 2024-03-14 ENCOUNTER — Telehealth (HOSPITAL_COMMUNITY): Payer: Self-pay | Admitting: Emergency Medicine

## 2024-03-14 NOTE — Telephone Encounter (Signed)
 Reaching out to patient to offer assistance regarding upcoming cardiac imaging study; pt verbalizes understanding of appt date/time, parking situation and where to check in, pre-test NPO status and medications ordered, and verified current allergies; name and call back number provided for further questions should they arise Rockwell Alexandria RN Navigator Cardiac Imaging [facility] [phone] office [phone] cell

## 2024-03-14 NOTE — Telephone Encounter (Signed)
 Pharmacy Quality Measure Review  This patient is appearing on a report for being at risk of failing the adherence measure for diabetes medications this calendar year.   Medication: Januvia  Last fill date: 12/03/23 for 90 day supply  Contacted pharmacy to facilitate refills.  Bowen Kia E. Marsh, PharmD, BCACP, CPP Clinical Pharmacist [facility] [phone]

## 2024-03-15 ENCOUNTER — Ambulatory Visit
Admission: RE | Admit: 2024-03-15 | Discharge: 2024-03-15 | Disposition: A | Discharge status: Home / Self Care | Source: Ambulatory Visit | Attending: Cardiology | Admitting: Cardiology

## 2024-03-15 DIAGNOSIS — R072 Precordial pain: Secondary | ICD-10-CM | POA: Insufficient documentation

## 2024-03-15 MED ORDER — METOPROLOL TARTRATE 5 MG/5ML IV SOLN
10.0000 mg | Freq: Once | INTRAVENOUS | Status: DC | PRN
  Filled 2024-03-15: qty 10

## 2024-03-15 MED ORDER — IOHEXOL 350 MG/ML SOLN
100.0000 mL | Freq: Once | INTRAVENOUS | Status: AC | PRN
  Administered 2024-03-15: 100 mL via INTRAVENOUS

## 2024-03-15 MED ORDER — DILTIAZEM HCL 25 MG/5ML IV SOLN
10.0000 mg | INTRAVENOUS | Status: DC | PRN
  Filled 2024-03-15: qty 5

## 2024-03-15 MED ORDER — NITROGLYCERIN 0.4 MG SL SUBL
0.8000 mg | SUBLINGUAL_TABLET | Freq: Once | SUBLINGUAL | Status: AC
  Administered 2024-03-15: 0.8 mg via SUBLINGUAL
  Filled 2024-03-15: qty 25

## 2024-03-21 ENCOUNTER — Ambulatory Visit: Admitting: Cardiology

## 2024-03-26 ENCOUNTER — Ambulatory Visit: Admitting: Internal Medicine

## 2024-04-09 ENCOUNTER — Ambulatory Visit: Admitting: Cardiology
